# Patient Record
Sex: Female | Born: 1965 | Race: White | Hispanic: No | Marital: Married | Smoking: Current some day smoker
Health system: Southern US, Community
[De-identification: ages and names within clinical notes are randomized; demographics above are authoritative.]

## PROBLEM LIST (undated history)

## (undated) DIAGNOSIS — K760 Fatty (change of) liver, not elsewhere classified: Secondary | ICD-10-CM

## (undated) DIAGNOSIS — K7460 Unspecified cirrhosis of liver: Secondary | ICD-10-CM

## (undated) DIAGNOSIS — F32A Depression, unspecified: Secondary | ICD-10-CM

## (undated) DIAGNOSIS — F1020 Alcohol dependence, uncomplicated: Secondary | ICD-10-CM

## (undated) DIAGNOSIS — B019 Varicella without complication: Secondary | ICD-10-CM

## (undated) DIAGNOSIS — E785 Hyperlipidemia, unspecified: Secondary | ICD-10-CM

## (undated) DIAGNOSIS — I341 Nonrheumatic mitral (valve) prolapse: Secondary | ICD-10-CM

## (undated) DIAGNOSIS — F329 Major depressive disorder, single episode, unspecified: Secondary | ICD-10-CM

## (undated) DIAGNOSIS — J302 Other seasonal allergic rhinitis: Secondary | ICD-10-CM

## (undated) HISTORY — DX: Fatty (change of) liver, not elsewhere classified: K76.0

## (undated) HISTORY — DX: Major depressive disorder, single episode, unspecified: F32.9

## (undated) HISTORY — PX: OTHER SURGICAL HISTORY: SHX169

## (undated) HISTORY — DX: Varicella without complication: B01.9

## (undated) HISTORY — PX: TONSILLECTOMY AND ADENOIDECTOMY: SHX28

## (undated) HISTORY — DX: Hyperlipidemia, unspecified: E78.5

## (undated) HISTORY — DX: Alcohol dependence, uncomplicated: F10.20

## (undated) HISTORY — DX: Depression, unspecified: F32.A

## (undated) HISTORY — DX: Nonrheumatic mitral (valve) prolapse: I34.1

## (undated) HISTORY — DX: Other seasonal allergic rhinitis: J30.2

## (undated) HISTORY — PX: OVARIAN CYST REMOVAL: SHX89

---

## 1998-11-03 ENCOUNTER — Emergency Department (HOSPITAL_COMMUNITY): Admission: EM | Admit: 1998-11-03 | Discharge: 1998-11-03 | Payer: Self-pay | Admitting: Emergency Medicine

## 2000-02-06 ENCOUNTER — Other Ambulatory Visit: Admission: RE | Admit: 2000-02-06 | Discharge: 2000-02-06 | Payer: Self-pay | Admitting: *Deleted

## 2000-02-27 ENCOUNTER — Encounter: Payer: Self-pay | Admitting: *Deleted

## 2000-02-27 ENCOUNTER — Encounter: Admission: RE | Admit: 2000-02-27 | Discharge: 2000-02-27 | Payer: Self-pay | Admitting: *Deleted

## 2000-04-16 ENCOUNTER — Other Ambulatory Visit: Admission: RE | Admit: 2000-04-16 | Discharge: 2000-04-16 | Payer: Self-pay | Admitting: *Deleted

## 2001-02-25 ENCOUNTER — Other Ambulatory Visit: Admission: RE | Admit: 2001-02-25 | Discharge: 2001-02-25 | Payer: Self-pay | Admitting: *Deleted

## 2002-04-05 ENCOUNTER — Encounter: Payer: Self-pay | Admitting: Emergency Medicine

## 2002-04-05 ENCOUNTER — Emergency Department (HOSPITAL_COMMUNITY): Admission: EM | Admit: 2002-04-05 | Discharge: 2002-04-05 | Payer: Self-pay | Admitting: Emergency Medicine

## 2007-11-08 ENCOUNTER — Emergency Department (HOSPITAL_COMMUNITY): Admission: EM | Admit: 2007-11-08 | Discharge: 2007-11-08 | Payer: Self-pay | Admitting: Emergency Medicine

## 2009-06-23 ENCOUNTER — Emergency Department (HOSPITAL_COMMUNITY): Admission: EM | Admit: 2009-06-23 | Discharge: 2009-06-23 | Payer: Self-pay | Admitting: Emergency Medicine

## 2010-08-04 LAB — CBC
HCT: 39.8 % (ref 36.0–46.0)
Hemoglobin: 14 g/dL (ref 12.0–15.0)
MCHC: 35.1 g/dL (ref 30.0–36.0)
MCV: 102.4 fL — ABNORMAL HIGH (ref 78.0–100.0)
Platelets: 205 10*3/uL (ref 150–400)
RBC: 3.89 MIL/uL (ref 3.87–5.11)
RDW: 13.1 % (ref 11.5–15.5)
WBC: 9 10*3/uL (ref 4.0–10.5)

## 2010-08-04 LAB — URINALYSIS, ROUTINE W REFLEX MICROSCOPIC
Bilirubin Urine: NEGATIVE
Leukocytes, UA: NEGATIVE
Nitrite: NEGATIVE
Specific Gravity, Urine: 1.004 — ABNORMAL LOW (ref 1.005–1.030)
Urobilinogen, UA: 0.2 mg/dL (ref 0.0–1.0)
pH: 5.5 (ref 5.0–8.0)

## 2010-08-04 LAB — DIFFERENTIAL
Eosinophils Relative: 0 % (ref 0–5)
Lymphocytes Relative: 24 % (ref 12–46)
Lymphs Abs: 2.2 10*3/uL (ref 0.7–4.0)
Neutro Abs: 6.5 10*3/uL (ref 1.7–7.7)

## 2010-08-04 LAB — COMPREHENSIVE METABOLIC PANEL
ALT: 31 U/L (ref 0–35)
AST: 24 U/L (ref 0–37)
Albumin: 4.3 g/dL (ref 3.5–5.2)
Alkaline Phosphatase: 61 U/L (ref 39–117)
BUN: 11 mg/dL (ref 6–23)
CO2: 27 mEq/L (ref 19–32)
Calcium: 8.3 mg/dL — ABNORMAL LOW (ref 8.4–10.5)
Chloride: 109 mEq/L (ref 96–112)
Creatinine, Ser: 0.6 mg/dL (ref 0.4–1.2)
GFR calc Af Amer: >60 mL/min (ref >60)
GFR calc non Af Amer: >60 mL/min (ref >60)
Glucose, Bld: 125 mg/dL — ABNORMAL HIGH (ref 70–99)
Potassium: 3.8 mEq/L (ref 3.5–5.1)
Sodium: 142 mEq/L (ref 135–145)
Total Bilirubin: 0.2 mg/dL — ABNORMAL LOW (ref 0.3–1.2)
Total Protein: 7.3 g/dL (ref 6.0–8.3)

## 2010-08-04 LAB — RAPID URINE DRUG SCREEN, HOSP PERFORMED: Tetrahydrocannabinol: NOT DETECTED

## 2010-08-04 LAB — ETHANOL
Alcohol, Ethyl (B): 311 mg/dL — ABNORMAL HIGH (ref 0–10)
Alcohol, Ethyl (B): 419 mg/dL (ref 0–10)

## 2010-08-04 LAB — URINE MICROSCOPIC-ADD ON

## 2010-08-04 LAB — SALICYLATE LEVEL: Salicylate Lvl: <4 mg/dL (ref 2.8–20.0)

## 2010-08-04 LAB — ACETAMINOPHEN LEVEL: Acetaminophen (Tylenol), Serum: <10 ug/mL — ABNORMAL LOW (ref 10–30)

## 2013-07-13 LAB — HM PAP SMEAR: HM Pap smear: NORMAL

## 2013-07-13 LAB — HM MAMMOGRAPHY: HM Mammogram: NORMAL

## 2013-10-17 ENCOUNTER — Telehealth: Payer: Self-pay

## 2013-10-17 NOTE — Telephone Encounter (Signed)
NEW PATIENT  Has not had a primary care doctor in years.  Was referred to [place] by her OB GYN  Dr. Mare Loan.    Will bring some of her records with her during her office visit.    Medication and allergies:  Reviewed and updated  90 day supply/mail order: n/a Local pharmacy:  [id number] - [place],  - [phone]   Immunizations due: UTD   A/P: No changes to personal, family history or past surgical hx PAP- 08/07/13-normal per patient MMG- 06/2013- normal Tdap- 05/2012 per patient   To Discuss with Provider: Would like to see a specialist regarding a liver as soon as possible.

## 2013-10-20 ENCOUNTER — Encounter: Payer: Self-pay | Admitting: Family Medicine

## 2013-10-20 ENCOUNTER — Ambulatory Visit (INDEPENDENT_AMBULATORY_CARE_PROVIDER_SITE_OTHER): Payer: 59 | Admitting: Family Medicine

## 2013-10-20 VITALS — BP 140/100 | HR 99 | Temp 98.3°F | Resp 16 | Ht 66.25 in | Wt 153.0 lb

## 2013-10-20 DIAGNOSIS — F1020 Alcohol dependence, uncomplicated: Secondary | ICD-10-CM

## 2013-10-20 DIAGNOSIS — F341 Dysthymic disorder: Secondary | ICD-10-CM

## 2013-10-20 DIAGNOSIS — K760 Fatty (change of) liver, not elsewhere classified: Secondary | ICD-10-CM

## 2013-10-20 DIAGNOSIS — IMO0001 Reserved for inherently not codable concepts without codable children: Secondary | ICD-10-CM

## 2013-10-20 DIAGNOSIS — K7689 Other specified diseases of liver: Secondary | ICD-10-CM

## 2013-10-20 DIAGNOSIS — R030 Elevated blood-pressure reading, without diagnosis of hypertension: Secondary | ICD-10-CM

## 2013-10-20 DIAGNOSIS — E785 Hyperlipidemia, unspecified: Secondary | ICD-10-CM | POA: Insufficient documentation

## 2013-10-20 DIAGNOSIS — F418 Other specified anxiety disorders: Secondary | ICD-10-CM

## 2013-10-20 NOTE — Progress Notes (Signed)
   Subjective:    Patient ID: Kristina Morris, female    DOB: 03/30/1966, 48 y.o.   MRN: [id number]  HPI New to establish.  Previous MD- no PCP  GYN- [facility]  Elevated BP- pt reports this is not usual for her.  Very anxious today, 'i worked myself up'.  Tearful in office.  Fatty liver- 7 yrs ago was dx'd w/ fatty liver.  AST 229, ALT 264.  In 2013 AST 65 and AST 41 ('9 months clean'- ETOH).  June 2014 AST 102, ALT 84.  March 2015 AST 92, ALT 96. Older brother passed away in 07/17/2022 this year due to liver failure.  Another brother also has liver disease and 2 aunts who were not drinkers died of liver failure.  Hyperlipidemia- total cholesterol 233, LDL 158 08/11/13.  Not currently on meds.  ETOH- chronic problem, pt will stay sober for months at a time and then go back to it.  Reports she's 'in a good place right now'.  Hx of anxiety/depression- did not respond well to Wellbutrin.  Also took Prozac but did not feel this was helpful.  Took Zoloft w/o results.    Review of Systems For ROS see HPI     Objective:   Physical Exam  Vitals reviewed. Constitutional: She is oriented to person, place, and time. She appears well-developed and well-nourished. No distress.  HENT:  Head: Normocephalic and atraumatic.  Eyes: Conjunctivae and EOM are normal. Pupils are equal, round, and reactive to light.  Neck: Normal range of motion. Neck supple. No thyromegaly present.  Cardiovascular: Normal rate, regular rhythm, normal heart sounds and intact distal pulses.   No murmur heard. Pulmonary/Chest: Effort normal and breath sounds normal. No respiratory distress.  Abdominal: Soft. She exhibits no distension and no mass (no hepatomegaly present). There is no tenderness. There is no rebound and no guarding.  Musculoskeletal: She exhibits no edema.  Lymphadenopathy:    She has no cervical adenopathy.  Neurological: She is alert and oriented to person, place, and time.  Skin: Skin is warm and  dry.  Psychiatric: Her behavior is normal.  Tearful, very anxious          Assessment & Plan:

## 2013-10-20 NOTE — Progress Notes (Signed)
Pre visit review using our clinic review tool, if applicable. No additional management support is needed unless otherwise documented below in the visit note. 

## 2013-10-20 NOTE — Patient Instructions (Signed)
Follow up in 3 months to recheck mood We'll call you with your liver referral at [facility] Make sure you are NOT drinking- you can do this! Call with any questions or concerns Welcome!  We're glad to have you!!

## 2013-10-21 NOTE — Assessment & Plan Note (Signed)
New to provider, ongoing for pt.  She has had multiple family members die of liver failure and she fears that w/ her family hx and her personal hx of ETOH abuse, she's 'going to be next'.  Will refer to liver specialist at [facility] and continue to follow.

## 2013-10-21 NOTE — Assessment & Plan Note (Signed)
Pt has been in and out of treatment multiple times.  Stressed the need to eliminate all ETOH in setting of fatty liver.  Will continue to follow.

## 2013-10-21 NOTE — Assessment & Plan Note (Signed)
New to provider, recurrent for pt.  She is not interested in medication at this time- has taken meds in the past w/o improvement of sxs.  Will continue to follow at future visits.  If sxs don't improve or she continues to self medicate w/ ETOH, may require psych referral.

## 2013-10-21 NOTE — Assessment & Plan Note (Signed)
New to provider.  Pt reports this is very unusual.  Suspect this is due to pt's high level of anxiety regarding today's appt.  Will follow at future visits.

## 2013-10-21 NOTE — Assessment & Plan Note (Signed)
New to provider, ongoing for pt.  Reviewed recent labs.  Will not start statin at this time due to hx of elevated LFTs and fatty liver.  Will defer starting med until after evaluation by liver specialist.

## 2013-10-23 ENCOUNTER — Telehealth: Payer: Self-pay | Admitting: Family Medicine

## 2013-10-23 MED ORDER — HYDROXYZINE PAMOATE 25 MG PO CAPS: 25.0000 mg | ORAL_CAPSULE | Freq: Every day | ORAL | Status: DC

## 2013-10-23 NOTE — Telephone Encounter (Signed)
Caller name: Angelinah  Call back [phone] [facility]acy[facility] [address] [place]  Reason for call: pt was waiting for the RX  hydrOXYzine (VISTARIL) 25 MG capsule to go to pharmacy but it has not arrived.

## 2013-10-23 NOTE — Telephone Encounter (Signed)
Med filled. Pt notified.  

## 2013-11-06 ENCOUNTER — Telehealth: Payer: Self-pay | Admitting: *Deleted

## 2013-11-06 MED ORDER — CITALOPRAM HYDROBROMIDE 20 MG PO TABS: 20.0000 mg | ORAL_TABLET | Freq: Every day | ORAL | Status: DC
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
## 2013-11-06 NOTE — Telephone Encounter (Signed)
Needs f/u appt to discuss symptoms Start Celexa 20mg  daily

## 2013-11-06 NOTE — Telephone Encounter (Signed)
Med filled. Left a detailed message on pt phone.

## 2013-11-10 ENCOUNTER — Telehealth: Payer: Self-pay | Admitting: *Deleted

## 2013-11-10 NOTE — Telephone Encounter (Signed)
Have pt break medication in 1/2 and take 10 mg daily and see if the side effects improve.  She must eat while taking the medication.  If she is unable to eat or continues to have shaky feeling, she should stop all meds and call the office.  If she is able to tolerate the 1/2 tab, any transient side effects will resolve w/in 7 days

## 2013-11-10 NOTE — Telephone Encounter (Signed)
Caller name:  Vernona RiegerLaura Relation to pt:  self Call back number: [phone]  Pharmacy:  [facility] [address]  Reason for call:   Pt called, since taking citalopram (CELEXA) 20 MG tablet, she has been shaking really bad and has caused her to get sick on her stomach.  She has continued taking the medication, last taken at 8am this morning.  Still shaking and nauseated.  Has not gotten sick since early hours this morning.  Has not eaten anything but crackers since Saturday evening.  Advised pt not to take any more until someone at our office got in touch with her.  Please advise.  bw

## 2013-11-10 NOTE — Telephone Encounter (Signed)
Pt.notified

## 2013-11-17 ENCOUNTER — Telehealth: Payer: Self-pay | Admitting: Family Medicine

## 2013-11-17 NOTE — Telephone Encounter (Signed)
Patient Information:  Caller Name: Vernona RiegerLaura  Phone: [phone]  Patient: Kristina CashLilly, Kristina Morris  Gender: Female  DOB: 01-Apr-1966  Age: [age] Years  PCP: Sheliah Hatchabori, Katherine E.  Pregnant: No  Office Follow Up:  Does the office need to follow up with this patient?: Yes  Instructions For The Office: She has called multiple times, frustrated with repeating her history and only wants to see or speak to Dr. Beverely Lowabori. She has cut her medication in 1/2 to 10mg  and still experiencing shaking, headaches, blurred vision, N/V and anorexia. Please advise if she should d/c medication (Per Epic 11/10/2013 documentation).  RN Note:  Afebrile. 11/06/2013 Celexa ordered, last week, 6/29/ medication cut in half due to side effects. In the last week has lost 10lb, due to no appetite, N/V and headaches. Today, 11/17/2013 body aches, headaches, blurred vision and back ache. She did take her 10mg  today, 11/17/2013 (sometimes takes with food, sometimes not) but as per Dr. Beverely Lowabori note, 11/10/2013 she would like to know if she should d/c her medication? Please advise and also advise if ok to take Ibuprofen with medication.  Symptoms  Reason For Call & Symptoms: Celexa pill started on 11/06/2013, last week, cut in 1/2 per the Dr. and to give 5 days.  Reviewed Health History In EMR: Yes  Reviewed Medications In EMR: Yes  Reviewed Allergies In EMR: Yes  Reviewed Surgeries / Procedures: Yes  Date of Onset of Symptoms: 11/06/2013 OB / GYN:  LMP: Unknown  Guideline(s) Used:  Headache  Disposition Per Guideline:   See Today or Tomorrow in Office  Reason For Disposition Reached:   Unexplained headache that is present > 24 hours  Advice Given:  Call Back If:  You become worse.  Patient Will Follow Care Advice:  YES

## 2013-11-17 NOTE — Telephone Encounter (Signed)
Pt instructed to STOP Celexa.  Pt stated understanding. No hx migraines.  States she does not want to schedule an appointment at this time despite advise.  States she believes the HAs and blurry vision is due to Celexa.  States she will call to schedule an appt if her symptoms persist or worsen after stopping Celexa. Denies drinking. Pt instructed to take ibuprofen as needed.  Stated understanding. Pt stated that she understands.  States she has been under a lot stress lately with her son.  No further concerns at this time.

## 2013-11-17 NOTE — Telephone Encounter (Signed)
STOP Celexa Please ask pt if she has a hx of migraines- HA and blurry vision is concerning and if no history of migraines pt needs appt w/ any available provider to r/o something more serious than medication side effect Is she drinking again?  Has she been drinking recently?  Withdrawal from alcohol can have similar symptoms Ok to take ibuprofen PT NEEDS APPT TO DISCUSS- please explain that since we are seeing the patients in our office, that we are not able to return phone calls.  I understand this is frustrating but that's why we utilize our very qualified nursing staff

## 2013-11-17 NOTE — Telephone Encounter (Signed)
Dustin T Shives at 11/17/2013 9:50 AM     Status: Signed        Pt called in regarding the these previous messages. Pt states she is still depressed, having headaches, medication concerns. Only wants to see DR. Tabori. Stated that i would transfer to CAN or I could schedule her for Friday in the first available slot. Pt hung up the phone.   Pt spoke to CAN and stated that she continues to experience unpleasant side effects due to Celexa.  Please see note below and advise.

## 2013-11-17 NOTE — Telephone Encounter (Signed)
Pt called in regarding the these previous messages.  Pt states she is still depressed, having headaches, medication concerns.  Only wants to see DR. Tabori.  Stated that i would transfer to CAN or I could schedule her for Friday in the first available slot.  Pt hung up the phone.

## 2013-11-18 ENCOUNTER — Telehealth: Payer: Self-pay | Admitting: *Deleted

## 2013-11-18 NOTE — Telephone Encounter (Signed)
[facility] Triage Call Report Triage Record Num: [id number] Operator: Windy CannySharon Harrison Patient Name: Kristina SenegalLaura Morris Call Date & Time: 11/15/2013 11:47:19AM Patient Phone: [phone] PCP: Patient Gender: Female PCP Fax : Patient DOB: November 12, 1965 Practice Name: [facility] Reason for Call: Caller: Ann/Mother; PCP: Other; CB#: [phone]; Call regarding Reaction to new medicine; Pt is currently taking Celexa 10mg . Initially started on 20 mg (Started taking medication on 11/05/2013). Started shaking (possible tremors) , nausea, and vomiting.. Diarrhea as well. Afebrile. States she feels dizzy and feels like she is going to pass out. Onset 7/1. Reports increased depression. Problems with concentration. While on phone pt vomiting and shortness of breath. Stayed on phone with pt until EMS arrived. Advised pt to ED. Triage per Allergic Reaction, Severe " New or worsening signs and symptoms that may indicate shock". Protocol(s) Used: Allergic Reaction, Severe Recommended Outcome per Protocol: Activate EMS 911 Reason for Outcome: New or worsening signs and symptoms that may indicate shock Care Advice: ~ Do not give the patient anything to eat or drink. Lay the person down and elevate legs at least 12 inches (30 cm) above level of heart. Cover to help maintain body temperature. ~ ~ IMMEDIATE ACTION Write down provider's name. List or place the following in a bag for transport with the patient: current prescription and/or nonprescription medications; alternative treatments, therapies and medications; and street drugs. ~ If previously prescribed for these symptoms by provider for this person, administer dose of epinephrine (e.g. EpiPen) as directed. ~ An adult should stay with the patient, preferably one trained in CPR. If the person is not trained in CPR, then he or she should provide hands-only (compression-only) CPR as recommended by the American Heart Association. ~ 11/15/2013 12:12:58PM  Page 1 of 1 [email or web address]

## 2014-01-05 ENCOUNTER — Telehealth: Payer: Self-pay | Admitting: Family Medicine

## 2014-01-05 NOTE — Telephone Encounter (Signed)
Called and spoke with pt. She advised that she is still having her nervousness and is still on celexa but began drinking again. Pt states she wanted to go to inpatient treatment facility but does not have the financial ability. Pt was given [facility] and information for [facility]. Pt states that she is going to begin going to AA meetings again now that her kids are in school. Pt advised to call back to the office to let me know how things are going and to discuss with tabori if need be.

## 2014-01-05 NOTE — Telephone Encounter (Signed)
Caller name: Pollyann  Call back number:[phone]   Reason for call:  Pt has questions about mental health and anti depressants she took.   Did not want to talk about anymore with me.

## 2014-01-06 ENCOUNTER — Ambulatory Visit (INDEPENDENT_AMBULATORY_CARE_PROVIDER_SITE_OTHER): Payer: 59 | Admitting: Psychiatry

## 2014-01-06 DIAGNOSIS — F1020 Alcohol dependence, uncomplicated: Secondary | ICD-10-CM

## 2014-01-06 NOTE — Telephone Encounter (Signed)
I would strongly encourage pt to look into [facility] as a treatment options- her anxiety and depression will improve if we can get her alcohol free

## 2014-01-20 ENCOUNTER — Ambulatory Visit: Payer: 59 | Admitting: Family Medicine

## 2014-03-09 ENCOUNTER — Encounter: Payer: Self-pay | Admitting: Family Medicine

## 2014-03-09 ENCOUNTER — Ambulatory Visit (INDEPENDENT_AMBULATORY_CARE_PROVIDER_SITE_OTHER): Payer: 59 | Admitting: Family Medicine

## 2014-03-09 VITALS — BP 130/80 | HR 94 | Temp 98.1°F | Resp 16 | Wt 159.1 lb

## 2014-03-09 DIAGNOSIS — F418 Other specified anxiety disorders: Secondary | ICD-10-CM

## 2014-03-09 DIAGNOSIS — F1024 Alcohol dependence with alcohol-induced mood disorder: Secondary | ICD-10-CM

## 2014-03-09 MED ORDER — SERTRALINE HCL 25 MG PO TABS: 25.0000 mg | ORAL_TABLET | Freq: Every day | ORAL | Status: DC
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
## 2014-03-09 NOTE — Progress Notes (Signed)
Pre visit review using our clinic review tool, if applicable. No additional management support is needed unless otherwise documented below in the visit note. 
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
## 2014-03-10 ENCOUNTER — Telehealth: Payer: Self-pay | Admitting: Family Medicine

## 2014-03-10 NOTE — Telephone Encounter (Signed)
emmi emailed °
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
## 2014-03-10 NOTE — Assessment & Plan Note (Signed)
Pt admits to drinking since returning home from rehab but is fighting really hard to stay sober.  Is currently in counseling.  Applauded her efforts.  Will follow.

## 2014-04-06 ENCOUNTER — Encounter: Payer: Self-pay | Admitting: Family Medicine

## 2014-04-06 ENCOUNTER — Ambulatory Visit (INDEPENDENT_AMBULATORY_CARE_PROVIDER_SITE_OTHER): Payer: 59 | Admitting: Family Medicine

## 2014-04-06 VITALS — BP 120/80 | HR 90 | Temp 98.2°F | Resp 16 | Wt 159.1 lb

## 2014-04-06 DIAGNOSIS — Z23 Encounter for immunization: Secondary | ICD-10-CM

## 2014-04-06 DIAGNOSIS — F418 Other specified anxiety disorders: Secondary | ICD-10-CM

## 2014-04-06 MED ORDER — SERTRALINE HCL 50 MG PO TABS: 50.0000 mg | ORAL_TABLET | Freq: Every day | ORAL | Status: DC

## 2014-04-06 MED ORDER — HYDROXYZINE PAMOATE 25 MG PO CAPS: ORAL_CAPSULE | ORAL | Status: DC

## 2014-04-06 NOTE — Patient Instructions (Signed)
Follow up in 4-6 weeks to recheck mood Increase the Zoloft to 50mg - 2 of what you have at home, 1 of the new prescription Continue to take it one day at a time, one step at a time- you can do this! 36 days!!!  I'm proud of you!!! Happy Thanksgiving!

## 2014-04-06 NOTE — Progress Notes (Signed)
Pre visit review using our clinic review tool, if applicable. No additional management support is needed unless otherwise documented below in the visit note. 
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
## 2014-04-06 NOTE — Assessment & Plan Note (Signed)
Improved since last visit.  Pt is tolerating Zoloft and is interested in increasing dose to 50mg  daily.  New script sent.  Applauded her efforts at sobriety.  Will continue to follow closely.

## 2014-05-04 ENCOUNTER — Telehealth: Payer: Self-pay | Admitting: Family Medicine

## 2014-05-04 MED ORDER — HYDROXYZINE PAMOATE 25 MG PO CAPS: ORAL_CAPSULE | ORAL | Status: DC
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
## 2014-08-30 ENCOUNTER — Other Ambulatory Visit: Payer: Self-pay | Admitting: Family Medicine

## 2014-08-31 NOTE — Telephone Encounter (Signed)
Med filled.  

## 2014-09-28 ENCOUNTER — Ambulatory Visit (INDEPENDENT_AMBULATORY_CARE_PROVIDER_SITE_OTHER): Payer: 59 | Admitting: Family Medicine

## 2014-09-28 ENCOUNTER — Encounter: Payer: Self-pay | Admitting: Family Medicine

## 2014-09-28 VITALS — BP 130/80 | HR 80 | Temp 98.1°F | Resp 16 | Wt 157.5 lb

## 2014-09-28 DIAGNOSIS — F418 Other specified anxiety disorders: Secondary | ICD-10-CM | POA: Diagnosis not present

## 2014-09-28 DIAGNOSIS — F1024 Alcohol dependence with alcohol-induced mood disorder: Secondary | ICD-10-CM

## 2014-09-28 MED ORDER — SERTRALINE HCL 100 MG PO TABS: 100.0000 mg | ORAL_TABLET | Freq: Every day | ORAL | Status: DC

## 2014-09-28 MED ORDER — HYDROXYZINE PAMOATE 25 MG PO CAPS: ORAL_CAPSULE | ORAL | Status: DC

## 2014-09-28 NOTE — Progress Notes (Signed)
Pre visit review using our clinic review tool, if applicable. No additional management support is needed unless otherwise documented below in the visit note. 
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
## 2014-09-28 NOTE — Patient Instructions (Signed)
Follow up in 4-6 weeks to recheck mood Increase the Zoloft to 100mg  daily (new script sent in) Continue to see your therapist- this will be really helpful Call with any questions or concerns Hang in there!!

## 2014-09-28 NOTE — Assessment & Plan Note (Signed)
Pt relapsed last week.  Has been sober x6 days since drinking.  Encouraged her to attend regular AA meetings and work w/ her therapist.  Will follow closely.
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
## 2014-11-30 ENCOUNTER — Emergency Department (HOSPITAL_COMMUNITY): Payer: 59

## 2014-11-30 ENCOUNTER — Emergency Department (HOSPITAL_COMMUNITY)
Admission: EM | Admit: 2014-11-30 | Discharge: 2014-12-01 | Disposition: A | Discharge status: Home / Self Care | Payer: 59 | Attending: Emergency Medicine | Admitting: Emergency Medicine

## 2014-11-30 ENCOUNTER — Encounter (HOSPITAL_COMMUNITY): Payer: Self-pay | Admitting: Emergency Medicine

## 2014-11-30 DIAGNOSIS — Z8619 Personal history of other infectious and parasitic diseases: Secondary | ICD-10-CM | POA: Diagnosis not present

## 2014-11-30 DIAGNOSIS — E876 Hypokalemia: Secondary | ICD-10-CM | POA: Diagnosis not present

## 2014-11-30 DIAGNOSIS — R569 Unspecified convulsions: Secondary | ICD-10-CM | POA: Insufficient documentation

## 2014-11-30 DIAGNOSIS — Y998 Other external cause status: Secondary | ICD-10-CM | POA: Diagnosis not present

## 2014-11-30 DIAGNOSIS — Z79899 Other long term (current) drug therapy: Secondary | ICD-10-CM | POA: Insufficient documentation

## 2014-11-30 DIAGNOSIS — Z880 Allergy status to penicillin: Secondary | ICD-10-CM | POA: Insufficient documentation

## 2014-11-30 DIAGNOSIS — Z7951 Long term (current) use of inhaled steroids: Secondary | ICD-10-CM | POA: Diagnosis not present

## 2014-11-30 DIAGNOSIS — F329 Major depressive disorder, single episode, unspecified: Secondary | ICD-10-CM | POA: Diagnosis not present

## 2014-11-30 DIAGNOSIS — Y9389 Activity, other specified: Secondary | ICD-10-CM | POA: Insufficient documentation

## 2014-11-30 DIAGNOSIS — S00532A Contusion of oral cavity, initial encounter: Secondary | ICD-10-CM | POA: Diagnosis not present

## 2014-11-30 DIAGNOSIS — Z720 Tobacco use: Secondary | ICD-10-CM | POA: Insufficient documentation

## 2014-11-30 DIAGNOSIS — Y9289 Other specified places as the place of occurrence of the external cause: Secondary | ICD-10-CM | POA: Diagnosis not present

## 2014-11-30 DIAGNOSIS — Z8719 Personal history of other diseases of the digestive system: Secondary | ICD-10-CM | POA: Diagnosis not present

## 2014-11-30 DIAGNOSIS — X58XXXA Exposure to other specified factors, initial encounter: Secondary | ICD-10-CM | POA: Diagnosis not present

## 2014-11-30 LAB — CBC
HCT: 39.4 % (ref 36.0–46.0)
Hemoglobin: 13.6 g/dL (ref 12.0–15.0)
MCH: 36.2 pg — ABNORMAL HIGH (ref 26.0–34.0)
MCHC: 34.5 g/dL (ref 30.0–36.0)
MCV: 104.8 fL — AB (ref 78.0–100.0)
Platelets: 81 10*3/uL — ABNORMAL LOW (ref 150–400)
RBC: 3.76 MIL/uL — AB (ref 3.87–5.11)
RDW: 13.5 % (ref 11.5–15.5)
WBC: 4.4 10*3/uL (ref 4.0–10.5)

## 2014-11-30 LAB — RAPID URINE DRUG SCREEN, HOSP PERFORMED
AMPHETAMINES: NOT DETECTED
Barbiturates: NOT DETECTED
Benzodiazepines: NOT DETECTED
COCAINE: NOT DETECTED
OPIATES: NOT DETECTED
Tetrahydrocannabinol: NOT DETECTED

## 2014-11-30 LAB — COMPREHENSIVE METABOLIC PANEL
ALK PHOS: 89 U/L (ref 38–126)
ALT: 45 U/L (ref 14–54)
AST: 73 U/L — AB (ref 15–41)
Albumin: 4.2 g/dL (ref 3.5–5.0)
Anion gap: 12 (ref 5–15)
BUN: 7 mg/dL (ref 6–20)
CALCIUM: 9 mg/dL (ref 8.9–10.3)
CHLORIDE: 97 mmol/L — AB (ref 101–111)
CO2: 28 mmol/L (ref 22–32)
Creatinine, Ser: 0.52 mg/dL (ref 0.44–1.00)
Glucose, Bld: 101 mg/dL — ABNORMAL HIGH (ref 65–99)
POTASSIUM: 3 mmol/L — AB (ref 3.5–5.1)
Sodium: 137 mmol/L (ref 135–145)
Total Bilirubin: 2 mg/dL — ABNORMAL HIGH (ref 0.3–1.2)
Total Protein: 7.2 g/dL (ref 6.5–8.1)

## 2014-11-30 LAB — LIPASE, BLOOD: Lipase: 21 U/L — ABNORMAL LOW (ref 22–51)

## 2014-11-30 LAB — CBG MONITORING, ED: Glucose-Capillary: 89 mg/dL (ref 65–99)

## 2014-11-30 MED ORDER — LORAZEPAM 2 MG/ML IJ SOLN
1.0000 mg | Freq: Once | INTRAMUSCULAR | Status: AC
  Administered 2014-11-30: 1 mg via INTRAVENOUS
  Filled 2014-11-30: qty 1

## 2014-11-30 MED ORDER — SODIUM CHLORIDE 0.9 % IV SOLN
INTRAVENOUS | Status: DC
  Administered 2014-11-30: 22:00:00 via INTRAVENOUS

## 2014-11-30 NOTE — ED Provider Notes (Signed)
CSN: [id number]     Arrival date & time 11/30/14  2125 History   First MD Initiated Contact with Patient 11/30/14 2131     Chief Complaint  Patient presents with  . Seizures     (Consider location/radiation/quality/duration/timing/severity/associated sxs/prior Treatment) Patient is a 49 y.o. female presenting with seizures. The history is provided by the patient and the EMS personnel.  Seizures Patient w hx etoh abuse, presents via ems w report of seizure at home just pta tonight. Pt was on couch at home when began shaking, and was unresponsive. Lasted approximately 1 minute, ems notes pt post-ictal upon arrival. Upon ed arrival, pt alert, oriented, and does not recall earlier events. No trauma/fall associated w seizure. +contusion/bit tongue. No incontinence. States w etoh abuse hx had been doing better in past 3 months, w relatively little etoh use. Denies hx complicated etoh withdrawal, seizures or dts, even when was drinking more heavily. Denies other drug use. States physical health at baseline recently. Denies trauma or fall. No headaches. No neck or back pain. No cough or uri c/o. No cp or sob. No palpitations. No abd pain. No nvd. No dysuria or gu c/o. Denies recent change in meds.       Past Medical History  Diagnosis Date  . Mitral valve prolapse   . Seasonal allergies   . Chicken pox   . Depression   . Alcoholic   . Fatty liver   . Hyperlipidemia    Past Surgical History  Procedure Laterality Date  . Ovarian cyst removal    . 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 Cysts removal, sinus    . Tonsillectomy and adenoidectomy     Family History  Problem Relation Age of Onset  . Depression Mother   . Liver disease Brother     older brother  . Liver disease Maternal Aunt   . Liver disease Paternal Aunt   . Alcoholism      both sides of family  . Diabetes Brother   . Hypertension Brother     older brother  . Breast cancer Mother   . Osteoporosis Mother   . Cancer      both sides of  family  . Heart disease Father   . Heart disease Maternal Grandmother    History  Substance Use Topics  . Smoking status: Current Some Day Smoker    Types: Cigarettes  . Smokeless tobacco: Not on file     Comment: Smoke about 1-2 times a week  . Alcohol Use: Yes     Comment: Currently not drinking, but states that she has started and stopped quite a few times.     OB History    No data available     Review of Systems  Constitutional: Negative for fever and chills.  HENT: Negative for sore throat.   Eyes: Negative for pain and visual disturbance.  Respiratory: Negative for cough and shortness of breath.   Cardiovascular: Negative for chest pain, palpitations and leg swelling.  Gastrointestinal: Negative for vomiting, abdominal pain and diarrhea.  Endocrine: Negative for polyuria.  Genitourinary: Negative for dysuria and flank pain.  Musculoskeletal: Negative for back pain, neck pain and neck stiffness.  Skin: Negative for rash.  Neurological: Positive for seizures. Negative for weakness, numbness and headaches.  Hematological: Does not bruise/bleed easily.  Psychiatric/Behavioral: Negative for confusion.      Allergies  Avelox; Doxycycline; Moxifloxacin; Bee venom; Amoxicillin; Erythromycin; and Sulfa antibiotics  Home Medications   Prior to  Admission medications   Medication Sig Start Date End Date Taking? Authorizing Provider  b complex vitamins tablet Take 1 tablet by mouth daily.    Historical Provider, MD  BIOTIN PO Take 10,000 mcg by mouth daily.    Historical Provider, MD  calcium carbonate (OS-CAL) 600 MG TABS tablet Take 600 mg by mouth daily. + Vitamin D    Historical Provider, MD  EPINEPHrine 0.3 mg/0.3 mL IJ SOAJ injection Inject 0.3 mg into the muscle as needed.    Historical Provider, MD  fexofenadine (ALLEGRA) 180 MG tablet Take 180 mg by mouth daily.    Historical Provider, MD  fluticasone (FLONASE) 50 MCG/ACT nasal spray Place 2 sprays into both nostrils  daily.    Historical Provider, MD  folic acid (FOLVITE) 800 MCG tablet Take 800 mcg by mouth daily.    Historical Provider, MD  GILDESS FE 1/20 1-20 MG-MCG tablet Take 1 tablet by mouth daily. 08/20/13   Historical Provider, MD  hydrOXYzine (VISTARIL) 25 MG capsule 1 tab Q4-6 prn and nightly for sleep 09/28/14   Sheliah Hatch, MD  Multiple Vitamin (DAILY MULTIVITAMIN PO) Take 1 tablet by mouth daily.    Historical Provider, MD  Probiotic Product (FLORAJEN3 PO) Take by mouth.    Historical Provider, MD  Saccharomyces boulardii (FLORASTOR PO) Take by mouth.    Historical Provider, MD  sertraline (ZOLOFT) 100 MG tablet Take 1 tablet (100 mg total) by mouth daily. 09/28/14   Sheliah Hatch, MD  vitamin E 400 UNIT capsule Take 400 Units by mouth daily.    Historical Provider, MD   SpO2 100% Physical Exam  Constitutional: She is oriented to person, place, and time. She appears well-developed and well-nourished. No distress.  HENT:  Nose: Nose normal.  Mouth/Throat: Oropharynx is clear and moist.  Contusion to lateral edge tongue.   Eyes: Conjunctivae are normal. Pupils are equal, round, and reactive to light. No scleral icterus.  Neck: Normal range of motion. Neck supple. No JVD present. No tracheal deviation present. No thyromegaly present.  No stiffness or rigidity. No bruit.   Cardiovascular: Normal rate, regular rhythm, normal heart sounds and intact distal pulses.  Exam reveals no gallop and no friction rub.   No murmur heard. Pulmonary/Chest: Effort normal and breath sounds normal. No respiratory distress. She exhibits no tenderness.  Abdominal: Soft. Normal appearance and bowel sounds are normal. She exhibits no distension and no mass. There is no tenderness. There is no rebound and no guarding.  Genitourinary:  No cva tenderness  Musculoskeletal: She exhibits no edema or tenderness.  CTLS spine, non tender, aligned, no step off. Good rom bil ext without pain or focal bony  tenderness, distal pulses palp bil.  Neurological: She is alert and oriented to person, place, and time.  Motor intact bil. No tremor or shakes. sens grossly intact.   Skin: Skin is warm and dry. No rash noted. She is not diaphoretic.  Psychiatric:  Mildly anxious  Nursing note and vitals reviewed.   ED Course  Procedures (including critical care time) Labs Review  Results for orders placed or performed during the hospital encounter of 11/30/14  Urine rapid drug screen (hosp performed)  Result Value Ref Range   Opiates NONE DETECTED NONE DETECTED   Cocaine NONE DETECTED NONE DETECTED   Benzodiazepines NONE DETECTED NONE DETECTED   Amphetamines NONE DETECTED NONE DETECTED   Tetrahydrocannabinol NONE DETECTED NONE DETECTED   Barbiturates NONE DETECTED NONE DETECTED  EKG Interpretation   Date/Time:  Monday November 30 2014 21:51:52 EDT Ventricular Rate:  65 PR Interval:  160 QRS Duration: 83 QT Interval:  433 QTC Calculation: 450 R Axis:   54 Text Interpretation:  Normal sinus rhythm No significant change since last  tracing Confirmed by Denton LankSTEINL  MD, Caryn BeeKEVIN ([id number]) on 11/30/2014 9:57:03 PM      MDM   Iv ns. Continuous pulse ox and monitor. Seizure precautions.  Labs.   Ct.  Reviewed nursing notes and prior charts for additional history.   2300 ct and labs pending - signed out to Dr Preston FleetingGlick to check labs and ct, recheck pt and dispo appropriately.      Cathren LaineKevin Valree Feild, MD 11/30/14 2300

## 2014-11-30 NOTE — ED Notes (Addendum)
Pt arrives via [facility] for reports of a seizure at home. Pt was found unresponsive on her couch exhibiting seizure activity-shaking- by her daughter. Pt denies hx of seizures. Was post ictal upon ems arrival, also was incontinent of urine. Pt received  of zofran and 100cc NS. Pt has hx of alcohol abuse, reports 3 months since last drink, denies alcohol withdrawal symptoms in the past when she has quit drinking. Pt alert, oriented, nad.

## 2014-12-01 LAB — ETHANOL: Alcohol, Ethyl (B): <5 mg/dL (ref <5)

## 2014-12-01 MED ORDER — POTASSIUM CHLORIDE CRYS ER 20 MEQ PO TBCR
40.0000 meq | EXTENDED_RELEASE_TABLET | Freq: Once | ORAL | Status: AC
  Administered 2014-12-01: 40 meq via ORAL
  Filled 2014-12-01: qty 2

## 2014-12-01 MED ORDER — POTASSIUM CHLORIDE CRYS ER 20 MEQ PO TBCR: 20.0000 meq | EXTENDED_RELEASE_TABLET | Freq: Two times a day (BID) | ORAL | Status: DC

## 2014-12-01 NOTE — Discharge Instructions (Signed)
Contact your primary care provider to arrange for an outpatient EEG. No treatment is needed for seizures, see her EEG is abnormal, or if you have another seizure. Please remember, the law in [place] is that he may not drive a car until you have gone 6 months without a seizure.  Seizure, Adult A seizure is abnormal electrical activity in the brain. Seizures usually last from 30 seconds to 2 minutes. There are various types of seizures. Before a seizure, you may have a warning sensation (aura) that a seizure is about to occur. An aura may include the following symptoms:   Fear or anxiety.  Nausea.  Feeling like the room is spinning (vertigo).  Vision changes, such as seeing flashing lights or spots. Common symptoms during a seizure include:  A change in attention or behavior (altered mental status).  Convulsions with rhythmic jerking movements.  Drooling.  Rapid eye movements.  Grunting.  Loss of bladder and bowel control.  Bitter taste in the mouth.  Tongue biting. After a seizure, you may feel confused and sleepy. You may also have an injury resulting from convulsions during the seizure. HOME CARE INSTRUCTIONS   If you are given medicines, take them exactly as prescribed by your health care provider.  Keep all follow-up appointments as directed by your health care provider.  Do not swim or drive or engage in risky activity during which a seizure could cause further injury to you or others until your health care provider says it is OK.  Get adequate rest.  Teach friends and family what to do if you have a seizure. They should:  Lay you on the ground to prevent a fall.  Put a cushion under your head.  Loosen any tight clothing around your neck.  Turn you on your side. If vomiting occurs, this helps keep your airway clear.  Stay with you until you recover.  Know whether or not you need emergency care. SEEK IMMEDIATE MEDICAL CARE IF:  The seizure lasts  longer than 5 minutes.  The seizure is severe or you do not wake up immediately after the seizure.  You have an altered mental status after the seizure.  You are having more frequent or worsening seizures. Someone should drive you to the emergency department or call local emergency services (911 in [place] MAKE SURE YOU:  Understand these instructions.  Will watch your condition.  Will get help right away if you are not doing well or get worse. Document Released: 04/28/2000 Document Revised: 02/19/2013 Document Reviewed: 12/11/2012 [facility] Patient Information 2015 [place], [place]. This information is not intended to replace advice given to you by your health care provider. Make sure you discuss any questions you have with your health care provider.   Hypokalemia Hypokalemia means that the amount of potassium in the blood is lower than normal.Potassium is a chemical, called an electrolyte, that helps regulate the amount of fluid in the body. It also stimulates muscle contraction and helps nerves function properly.Most of the body's potassium is inside of cells, and only a very small amount is in the blood. Because the amount in the blood is so small, minor changes can be life-threatening. CAUSES  Antibiotics.  Diarrhea or vomiting.  Using laxatives too much, which can cause diarrhea.  Chronic kidney disease.  Water pills (diuretics).  Eating disorders (bulimia).  Low magnesium level.  Sweating a lot. SIGNS AND SYMPTOMS  Weakness.  Constipation.  Fatigue.  Muscle cramps.  Mental confusion.  Skipped heartbeats or irregular  heartbeat (palpitations).  Tingling or numbness. DIAGNOSIS  Your health care provider can diagnose hypokalemia with blood tests. In addition to checking your potassium level, your health care provider may also check other lab tests. TREATMENT Hypokalemia can be treated with potassium supplements taken by mouth or adjustments in your current  medicines. If your potassium level is very low, you may need to get potassium through a vein (IV) and be monitored in the hospital. A diet high in potassium is also helpful. Foods high in potassium are:  Nuts, such as peanuts and pistachios.  Seeds, such as sunflower seeds and pumpkin seeds.  Peas, lentils, and lima beans.  Whole grain and bran cereals and breads.  Fresh fruit and vegetables, such as apricots, avocado, bananas, cantaloupe, kiwi, oranges, tomatoes, asparagus, and potatoes.  Orange and tomato juices.  Red meats.  Fruit yogurt. HOME CARE INSTRUCTIONS  Take all medicines as prescribed by your health care provider.  Maintain a healthy diet by including nutritious food, such as fruits, vegetables, nuts, whole grains, and lean meats.  If you are taking a laxative, be sure to follow the directions on the label. SEEK MEDICAL CARE IF:  Your weakness gets worse.  You feel your heart pounding or racing.  You are vomiting or having diarrhea.  You are diabetic and having trouble keeping your blood glucose in the normal range. SEEK IMMEDIATE MEDICAL CARE IF:  You have chest pain, shortness of breath, or dizziness.  You are vomiting or having diarrhea for more than 2 days.  You faint. MAKE SURE YOU:   Understand these instructions.  Will watch your condition.  Will get help right away if you are not doing well or get worse. Document Released: 05/01/2005 Document Revised: 02/19/2013 Document Reviewed: 11/01/2012 [facility]ExitCare Patient Information 2015 [place]. This information is not intended to replace advice given to you by your health care provider. Make sure you discuss any questions you have with your health care provider.  Potassium Salts tablets, extended-release tablets or capsules What is this medicine? POTASSIUM (poe TASS i um) is a natural salt that is important for the heart, muscles, and nerves. It is found in many foods and is normally supplied by a  well balanced diet. This medicine is used to treat low potassium. This medicine may be used for other purposes; ask your health care provider or pharmacist if you have questions. COMMON BRAND NAME(S): ED-K+10, Glu-K, K-10, K-8, K-Dur, K-Tab, Kaon-CL, Klor-Con, Klor-Con M10, Klor-Con M15, Klor-Con M20, Klotrix, Micro-K, Micro-K Extencaps, Slow-K What should I tell my health care provider before I take this medicine? They need to know if you have any of these conditions: -dehydration -diabetes -irregular heartbeat -kidney disease -stomach ulcers or other stomach problems -an unusual or allergic reaction to potassium salts, other medicines, foods, dyes, or preservatives -pregnant or trying to get pregnant -breast-feeding How should I use this medicine? Take this medicine by mouth with a full glass of water. Follow the directions on the prescription label. Take with food. Do not suck on, crush, or chew this medicine. If you have difficulty swallowing, ask the pharmacist how to take. Take your medicine at regular intervals. Do not take it more often than directed. Do not stop taking except on your doctor's advice. Talk to your pediatrician regarding the use of this medicine in children. Special care may be needed. Overdosage: If you think you have taken too much of this medicine contact a poison control center or emergency room at once. NOTE: This  medicine is only for you. Do not share this medicine with others. What if I miss a dose? If you miss a dose, take it as soon as you can. If it is almost time for your next dose, take only that dose. Do not take double or extra doses. What may interact with this medicine? Do not take this medicine with any of the following medications: -eplerenone -sodium polystyrene sulfonate This medicine may also interact with the following medications: -medicines for blood pressure or heart disease like lisinopril, losartan, quinapril, valsartan -medicines for cold  or allergies -medicines for inflammation like ibuprofen, indomethacin -medicines for Parkinson's disease -medicines for the stomach like metoclopramide, dicyclomine, glycopyrrolate -some diuretics This list may not describe all possible interactions. Give your health care provider a list of all the medicines, herbs, non-prescription drugs, or dietary supplements you use. Also tell them if you smoke, drink alcohol, or use illegal drugs. Some items may interact with your medicine. What should I watch for while using this medicine? Visit your doctor or health care professional for regular check ups. You will need lab work done regularly. You may need to be on a special diet while taking this medicine. Ask your doctor. What side effects may I notice from receiving this medicine? Side effects that you should report to your doctor or health care professional as soon as possible: -allergic reactions like skin rash, itching or hives, swelling of the face, lips, or tongue -black, tarry stools -heartburn -irregular heartbeat -numbness or tingling in hands or feet -pain when swallowing -unusually weak or tired Side effects that usually do not require medical attention (report to your doctor or health care professional if they continue or are bothersome): -diarrhea -nausea -stomach gas -vomiting This list may not describe all possible side effects. Call your doctor for medical advice about side effects. You may report side effects to FDA at 1-800-FDA-1088. Where should I keep my medicine? Keep out of the reach of children. Store at room temperature between 15 and 30 degrees C (59 and 86 degrees F ). Keep bottle closed tightly to protect this medicine from light and moisture. Throw away any unused medicine after the expiration date. NOTE: This sheet is a summary. It may not cover all possible information. If you have questions about this medicine, talk to your doctor, pharmacist, or health care  provider.  2015, [employer] Standard. (2007-07-17 11:17:31)

## 2014-12-01 NOTE — ED Provider Notes (Signed)
Patient initially seen and evaluated by Dr. Denton LankSteinl having presented with first time seizure. Workup is significant only for hypokalemia. Patient was advised of results and advised to make arrangements for outpatient EEG to complete seizure workup. She is given a dose of potassium in the ED and is discharged with a prescription for K-Dur. She was again questioned about recent alcohol use and specifically denied same.  Dione Boozeavid Denia Mcvicar, MD 12/01/14 [phone]

## 2014-12-01 NOTE — ED Notes (Signed)
Pt ambulating independently w/ steady gait on d/c in no acute distress, A&Ox4. D/c instructions reviewed w/ pt and family - pt and family deny any further questions or concerns at present. Rx given x1  

## 2014-12-02 ENCOUNTER — Other Ambulatory Visit: Payer: Self-pay | Admitting: General Practice

## 2014-12-02 ENCOUNTER — Ambulatory Visit (INDEPENDENT_AMBULATORY_CARE_PROVIDER_SITE_OTHER): Payer: 59 | Admitting: Family Medicine

## 2014-12-02 ENCOUNTER — Telehealth: Payer: Self-pay | Admitting: Family Medicine

## 2014-12-02 ENCOUNTER — Telehealth: Payer: Self-pay

## 2014-12-02 ENCOUNTER — Encounter: Payer: Self-pay | Admitting: Family Medicine

## 2014-12-02 VITALS — BP 126/86 | HR 83 | Temp 97.9°F | Resp 16 | Wt 153.5 lb

## 2014-12-02 DIAGNOSIS — R569 Unspecified convulsions: Secondary | ICD-10-CM | POA: Diagnosis not present

## 2014-12-02 DIAGNOSIS — E876 Hypokalemia: Secondary | ICD-10-CM | POA: Insufficient documentation

## 2014-12-02 LAB — CBC WITH DIFFERENTIAL/PLATELET
Basophils Absolute: 0 10*3/uL (ref 0.0–0.1)
Basophils Relative: 0.3 % (ref 0.0–3.0)
Eosinophils Absolute: 0.2 10*3/uL (ref 0.0–0.7)
Eosinophils Relative: 2.9 % (ref 0.0–5.0)
HCT: 40.2 % (ref 36.0–46.0)
HEMOGLOBIN: 14 g/dL (ref 12.0–15.0)
LYMPHS ABS: 1.7 10*3/uL (ref 0.7–4.0)
Lymphocytes Relative: 28.3 % (ref 12.0–46.0)
MCHC: 34.9 g/dL (ref 30.0–36.0)
MCV: 106.9 fl — AB (ref 78.0–100.0)
Monocytes Absolute: 0.2 10*3/uL (ref 0.1–1.0)
Monocytes Relative: 3.6 % (ref 3.0–12.0)
Neutro Abs: 3.9 10*3/uL (ref 1.4–7.7)
Neutrophils Relative %: 64.9 % (ref 43.0–77.0)
Platelets: 111 10*3/uL — ABNORMAL LOW (ref 150.0–400.0)
RBC: 3.76 Mil/uL — AB (ref 3.87–5.11)
RDW: 14 % (ref 11.5–15.5)
WBC: 5.9 10*3/uL (ref 4.0–10.5)

## 2014-12-02 LAB — BASIC METABOLIC PANEL
BUN: 10 mg/dL (ref 6–23)
CO2: 31 meq/L (ref 19–32)
CREATININE: 0.54 mg/dL (ref 0.40–1.20)
Calcium: 9.8 mg/dL (ref 8.4–10.5)
Chloride: 99 mEq/L (ref 96–112)
GFR: 127.26 mL/min (ref >60.00)
Glucose, Bld: 89 mg/dL (ref 70–99)
Potassium: 3.1 mEq/L — ABNORMAL LOW (ref 3.5–5.1)
Sodium: 139 mEq/L (ref 135–145)

## 2014-12-02 LAB — TSH: TSH: 2.1 u[IU]/mL (ref 0.35–4.50)

## 2014-12-02 MED ORDER — POTASSIUM CHLORIDE CRYS ER 20 MEQ PO TBCR: 20.0000 meq | EXTENDED_RELEASE_TABLET | Freq: Every day | ORAL | Status: DC

## 2014-12-02 NOTE — Telephone Encounter (Signed)
Left a message for call back.  

## 2014-12-02 NOTE — Patient Instructions (Signed)
Schedule your complete physical in 3-4 months We'll notify you of your lab results and make any changes if needed We'll call you with your Neurology appt Call with any questions or concerns Hang in there!  We'll figure this out!

## 2014-12-02 NOTE — Telephone Encounter (Signed)
Pt is doing okay this morning.  No seizures since discharged from hospital.  Family member will be driving patient to her appointment today at 11 am.

## 2014-12-02 NOTE — Progress Notes (Signed)
   Subjective:    Patient ID: Kristina CashLaura J Morris, female    DOB: 1965/07/25, 49 y.o.   MRN: [id number]  HPI Seizure- pt was seen in ER on 7/18 after new onset seizure activity.  Had normal work up w/ exception of hypokalemia.  Pt's episode was witnessed by 49 yr old daughter.  Daughter reports she was talking to pt and she wouldn't respond and then noted blood in pt's mouth after she bit her tongue.  Daughter called 911.  Pt has no recollection of events.  Denies ETOH.  No drugs/ETOH in blood.  No changes in medications.  No further seizure activity since d/c from ER on 7/19.   Review of Systems For ROS see HPI     Objective:   Physical Exam  Constitutional: She is oriented to person, place, and time. She appears well-developed and well-nourished. No distress.  HENT:  Head: Normocephalic and atraumatic.  Eyes: Conjunctivae and EOM are normal. Pupils are equal, round, and reactive to light.  Neck: Normal range of motion. Neck supple.  Cardiovascular: Normal rate, regular rhythm, normal heart sounds and intact distal pulses.   Pulmonary/Chest: Effort normal and breath sounds normal. No respiratory distress. She has no wheezes. She has no rales.  Lymphadenopathy:    She has no cervical adenopathy.  Neurological: She is alert and oriented to person, place, and time. She has normal reflexes. No cranial nerve deficit. Coordination normal.  Psychiatric: She has a normal mood and affect. Her behavior is normal. Judgment and thought content normal.  Vitals reviewed.         Assessment & Plan:

## 2014-12-02 NOTE — Telephone Encounter (Signed)
Pt notified that provider is ok with her going to the pool, however provider would like someone to be with her at all times, that way she has someone to call for assistance if she has another seizure.

## 2014-12-02 NOTE — Telephone Encounter (Signed)
Caller name: Davionne Relation to pt: Call back number: [phone] Pharmacy:  Reason for call:   Patient is wanting to know if she can go swimming until she see's the neurologist?

## 2014-12-02 NOTE — Progress Notes (Signed)
Pre visit review using our clinic review tool, if applicable. No additional management support is needed unless otherwise documented below in the visit note. 

## 2014-12-04 ENCOUNTER — Encounter: Payer: Self-pay | Admitting: Neurology

## 2014-12-04 ENCOUNTER — Ambulatory Visit (INDEPENDENT_AMBULATORY_CARE_PROVIDER_SITE_OTHER): Payer: 59 | Admitting: Neurology

## 2014-12-04 VITALS — BP 108/72 | HR 69 | Resp 16 | Ht 66.0 in | Wt 152.0 lb

## 2014-12-04 DIAGNOSIS — R569 Unspecified convulsions: Secondary | ICD-10-CM

## 2014-12-04 DIAGNOSIS — R292 Abnormal reflex: Secondary | ICD-10-CM | POA: Insufficient documentation

## 2014-12-04 NOTE — Progress Notes (Signed)
NEUROLOGY CONSULTATION NOTE  [name] MRN: [id number] DOB: [birth year]  Referring provider: Dr. Neena Rhymes Primary care provider: Dr. Neena Rhymes  Reason for consult:  seizure  Dear Dr Beverely Low:  Thank you for your kind referral of Kristina Morris for consultation of the above symptoms. Although her history is well known to you, please allow me to reiterate it for the purpose of our medical record. Records and images were personally reviewed where available.  HISTORY OF PRESENT ILLNESS: This is a pleasant 49 year old right-handed woman with a history of depression, anxiety, alcohol abuse, sober for 3 months, presenting with new onset seizure last 11/30/2014. She reports feeling run down 3 days prior to the seizure, with pain in the right upper quadrant of her abdomen, attributed to a pulled muscle. She recalls looking at her daughter that evening, and feeling like a projector was putting what she saw on TV on the wall as she turned her head, then next thing she knew she was waking up to EMS around her. She did not recognize familiar people and was in and out in the ambulance. She did not recall any focal weakness, her whole body felt sore. She had bitten the sides of her tongue, no incontinence. She was brought to [facility] where CBC and CMP were unremarkable except for potassium of 3.0. UDS and EtOH level negative. I personally reviewed head CT without contrast which did not show any acute changes. She was back to baseline and discharged home.  She reports an episode of loss of consciousness without convulsive activity after she was bit by a bee 7 years ago. She recalls feeing unwell and was told that she was unresponsive before she passed out. She denies any olfactory/gustatory hallucinations, deja vu, rising epigastric sensation, focal numbness/tingling/weakness, myoclonic jerks. She denies any alcohol intake in the past 3 months, no sleep deprivation prior to the seizure. She has had  headaches for the past couple of years, better with less stress. She denies any dizziness, diplopia, dysarthria, dysphagia, neck/back pain, focal numbness/tingling/weakness, bowel/bladder dysfunction. She had a normal birth and early development.  There is no history of febrile convulsions, CNS infections such as meningitis/encephalitis, significant traumatic brain injury, neurosurgical procedures, or family history of seizures.  PAST MEDICAL HISTORY: Past Medical History  Diagnosis Date  . Mitral valve prolapse   . Seasonal allergies   . Chicken pox   . Depression   . Alcoholic   . Fatty liver   . Hyperlipidemia     PAST SURGICAL HISTORY: Past Surgical History  Procedure Laterality Date  . Ovarian cyst removal    . 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 Cysts removal, sinus    . Tonsillectomy and adenoidectomy      MEDICATIONS: Current Outpatient Prescriptions on File Prior to Visit  Medication Sig Dispense Refill  . b complex vitamins tablet Take 1 tablet by mouth 2 (two) times a week.     Marland Kitchen BIOTIN PO Take 10,000 mcg by mouth daily.    . calcium carbonate (OS-CAL) 600 MG TABS tablet Take 600 mg by mouth daily. + Vitamin D    . fexofenadine (ALLEGRA) 180 MG tablet Take 180 mg by mouth as needed for allergies.     . fluticasone (FLONASE) 50 MCG/ACT nasal spray Place 2 sprays into both nostrils daily as needed for allergies.     . folic acid (FOLVITE) 800 MCG tablet Take 800 mcg by mouth daily.    Marland Kitchen GILDESS  FE 1/20 1-20 MG-MCG tablet Take 1 tablet by mouth daily.    . hydrOXYzine (VISTARIL) 25 MG capsule 1 tab Q4-6 prn and nightly for sleep (Patient taking differently: Take 25 mg by mouth 2 (two) times daily as needed for anxiety (takes in daytime as needed and takes every night for sleep). ) 90 capsule 1  . Multiple Vitamin (DAILY MULTIVITAMIN PO) Take 1 tablet by mouth daily.    . potassium chloride SA (K-DUR,KLOR-CON) 20 MEQ tablet Take 1 tablet (20 mEq total) by mouth daily. 30 tablet 6  .  sertraline (ZOLOFT) 100 MG tablet Take 1 tablet (100 mg total) by mouth daily. (Patient taking differently: Take 100 mg by mouth every evening. ) 30 tablet 3  . vitamin E 400 UNIT capsule Take 400 Units by mouth daily.    Marland Kitchen EPINEPHrine 0.3 mg/0.3 mL IJ SOAJ injection Inject 0.3 mg into the muscle as needed.     No current facility-administered medications on file prior to visit.    ALLERGIES: Allergies  Allergen Reactions  . Avelox [Moxifloxacin Hcl In Nacl] Shortness Of Breath, Itching and Rash    GI Upset  . Bee Venom Anaphylaxis  . Doxycycline Nausea Only and Rash  . Amoxicillin Rash    Upset stomach   . Erythromycin Rash    Upset stomach  . Sulfa Antibiotics Rash    Upset stomach    FAMILY HISTORY: Family History  Problem Relation Age of Onset  . Depression Mother   . Liver disease Brother     older brother  . Liver disease Maternal Aunt   . Liver disease Paternal Aunt   . Alcoholism      both sides of family  . Diabetes Brother   . Hypertension Brother     older brother  . Breast cancer Mother   . Osteoporosis Mother   . Cancer      both sides of family  . Heart disease Father   . Heart disease Maternal Grandmother     SOCIAL HISTORY: History   Social History  . Marital Status: Married    Spouse Name: N/A  . Number of Children: N/A  . Years of Education: N/A   Occupational History  . Not on file.   Social History Main Topics  . Smoking status: Current Some Day Smoker    Types: Cigarettes  . Smokeless tobacco: Not on file     Comment: Smoke about 1-2 times a week  . Alcohol Use: No     Comment: History of Alcohol Abuse  . Drug Use: No  . Sexual Activity:    Partners: Male   Other Topics Concern  . Not on file   Social History Narrative    REVIEW OF SYSTEMS: Constitutional: No fevers, chills, or sweats, no generalized fatigue, change in appetite Eyes: No visual changes, double vision, eye pain Ear, nose and throat: No hearing loss, ear  pain, nasal congestion, sore throat Cardiovascular: No chest pain, palpitations Respiratory:  No shortness of breath at rest or with exertion, wheezes GastrointestinaI: No nausea, vomiting, diarrhea, abdominal pain, fecal incontinence Genitourinary:  No dysuria, urinary retention or frequency Musculoskeletal:  No neck pain, back pain Integumentary: No rash, pruritus, skin lesions Neurological: as above Psychiatric: No depression, insomnia, anxiety Endocrine: No palpitations, fatigue, diaphoresis, mood swings, change in appetite, change in weight, increased thirst Hematologic/Lymphatic:  No anemia, purpura, petechiae. Allergic/Immunologic: no itchy/runny eyes, nasal congestion, recent allergic reactions, rashes  PHYSICAL EXAM: Filed Vitals:   12/04/14  1520  BP: 108/72  Pulse: 69  Resp: 16   General: No acute distress Head:  Normocephalic/atraumatic Eyes: Fundoscopic exam shows bilateral sharp discs, no vessel changes, exudates, or hemorrhages Neck: supple, no paraspinal tenderness, full range of motion Back: No paraspinal tenderness Heart: regular rate and rhythm Lungs: Clear to auscultation bilaterally. Vascular: No carotid bruits. Skin/Extremities: No rash, no edema Neurological Exam: Mental status: alert and oriented to person, place, and time, no dysarthria or aphasia, Fund of knowledge is appropriate.  Recent and remote memory are intact. 2/3 delayed recall. Attention and concentration are normal.    Able to name objects and repeat phrases. Cranial nerves: CN I: not tested CN II: pupils equal, round and reactive to light, visual fields intact, fundi unremarkable. CN III, IV, VI:  full range of motion, no nystagmus, no ptosis CN V: facial sensation intact CN VII: upper and lower face symmetric CN VIII: hearing intact to finger rub CN IX, X: gag intact, uvula midline CN XI: sternocleidomastoid and trapezius muscles intact CN XII: tongue midline Bulk & Tone: normal, no  fasciculations. Motor: 5/5 throughout with no pronator drift. Sensation: intact to light touch, cold, pin, vibration and joint position sense.  No extinction to double simultaneous stimulation.  Romberg test negative Deep Tendon Reflexes: brisk +3 throughout with bilateral Hoffman sign, no ankle clonus Plantar responses: downgoing bilaterally Cerebellar: no incoordination on finger to nose, heel to shin. No dysdiadochokinesia Gait: narrow-based and steady, able to tandem walk adequately. Tremor: none  IMPRESSION: This is a pleasant 49 year old right-handed woman with a history of depression, anxiety, alcohol abuse, sober for 3 months, presenting with an apparent unprovoked seizure. Her neurological exam today is non-focal however there is note of hyperreflexia. MRI brain with and without contrast and MRI C-spine with and without contrast will be ordered to assess for focal structural abnormality. A routine EEG will be done. We discussed that after an initial seizure, unless there are significant risk factors, an abnormal neurological exam, an EEG showing epileptiform abnormalities, and/or abnormal neuroimaging, treatment with an antiepileptic drug is not indicated. We discussed 10% of the population may have a single seizure. Patients with a single unprovoked seizure have a recurrence rate of 33% after a single seizure and 73% after a second seizure. We discussed [place] driving restrictions which indicate a patient needs to free of seizures or events of altered awareness for 6 months prior to resuming driving. The patient agreed to comply with these restrictions.  Seizure precautions were discussed which include no driving, no bathing in a tub, no swimming alone, no cooking over an open flame, no operating dangerous machinery, and no activities which may endanger oneself or someone else. She will follow-up after the tests and knows to call our office for any questions in the interim.  Thank you for allowing  me to participate in the care of this patient. Please do not hesitate to call for any questions or concerns.   Patrcia Dolly, M.D.  CC: Dr. Beverely Low

## 2014-12-04 NOTE — Patient Instructions (Signed)
1. Schedule MRI brain with and without contrast 2. Schedule MRI cervical spine with and without contrast 3. Schedule routine EEG 4. Follow-up after tests  Seizure Precautions: 1. If medication has been prescribed for you to prevent seizures, take it exactly as directed.  Do not stop taking the medicine without talking to your doctor first, even if you have not had a seizure in a long time.   2. Avoid activities in which a seizure would cause danger to yourself or to others.  Don't operate dangerous machinery, swim alone, or climb in high or dangerous places, such as on ladders, roofs, or girders.  Do not drive unless your doctor says you may.  3. If you have any warning that you may have a seizure, lay down in a safe place where you can't hurt yourself.    4.  No driving for 6 months from last seizure, as per [facility].   Please refer to the following link on the [employer]'s website for more information: http://www.epilepsyfoundation.org/answerplace/Social/driving/drivingu.cfm   5.  Maintain good sleep hygiene.  6.  Notify your neurology if you are planning pregnancy or if you become pregnant.  7.  Contact your doctor if you have any problems that may be related to the medicine you are taking.  8.  Call 911 and bring the patient back to the ED if:        A.  The seizure lasts longer than 5 minutes.       B.  The patient doesn't awaken shortly after the seizure  C.  The patient has new problems such as difficulty seeing, speaking or moving  D.  The patient was injured during the seizure  E.  The patient has a temperature over 102 F (39C)  F.  The patient vomited and now is having trouble breathing

## 2014-12-06 NOTE — Assessment & Plan Note (Signed)
New.  Noted on labs done at ER at time of seizure evaluation.  Will repeat today and replete K+ prn.  Pt expressed understanding and is in agreement w/ plan.

## 2014-12-06 NOTE — Assessment & Plan Note (Addendum)
New.  Reviewed pt's ER notes.  Pt denies recent ETOH use/withdrawal.  No change in meds.  No obvious cause and no hx of seizures.  Will refer to neuro for complete evaluation and tx.  Pt is aware that she is not to drive for 6 months and that the clock resets if she has another episode.  Check labs to r/o metabolic causes.  Will follow closely.

## 2014-12-07 ENCOUNTER — Other Ambulatory Visit: Payer: Self-pay

## 2014-12-10 ENCOUNTER — Ambulatory Visit (INDEPENDENT_AMBULATORY_CARE_PROVIDER_SITE_OTHER): Payer: 59 | Admitting: Neurology

## 2014-12-10 DIAGNOSIS — R569 Unspecified convulsions: Secondary | ICD-10-CM

## 2014-12-11 NOTE — Procedures (Signed)
ELECTROENCEPHALOGRAM REPORT  Date of Study: 12/10/2014  Patient's Name: Kristina Morris MRN: [id number] Date of Birth: 1965/05/22  Referring Provider: Dr. Patrcia Dolly  Clinical History: This is a 49 year old woman with new onset seizure.  Medications: Vistaril, vitamins  Technical Summary: A multichannel digital EEG recording measured by the international 10-20 system with electrodes applied with paste and impedances below 5000 ohms performed in our laboratory with EKG monitoring in an awake and asleep patient.  Hyperventilation and photic stimulation were performed.  The digital EEG was referentially recorded, reformatted, and digitally filtered in a variety of bipolar and referential montages for optimal display.    Description: The patient is awake and asleep during the recording.  During maximal wakefulness, there is a symmetric, medium voltage 10 Hz posterior dominant rhythm that attenuates with eye opening.  The record is symmetric.  During drowsiness and sleep, there is an increase in theta slowing of the background.  Vertex waves and symmetric sleep spindles were seen.  Hyperventilation and photic stimulation did not elicit any abnormalities.  There were no epileptiform discharges or electrographic seizures seen.    EKG lead was unremarkable.  Impression: This awake and asleep EEG is normal.    Clinical Correlation: A normal EEG does not exclude a clinical diagnosis of epilepsy.  If further clinical questions remain, prolonged EEG may be helpful.  Clinical correlation is advised.   Patrcia Dolly, M.D.

## 2014-12-14 ENCOUNTER — Other Ambulatory Visit: Payer: Self-pay

## 2014-12-14 ENCOUNTER — Inpatient Hospital Stay: Admission: RE | Admit: 2014-12-14 | Payer: Self-pay | Source: Ambulatory Visit

## 2014-12-16 ENCOUNTER — Telehealth: Payer: Self-pay | Admitting: Family Medicine

## 2014-12-16 NOTE — Telephone Encounter (Signed)
Patient was notified of result & advisement. 

## 2014-12-16 NOTE — Telephone Encounter (Signed)
-----   Message from Van Clines, MD sent at 12/15/2014  1:08 PM EDT ----- Pls let her know EEG is normal, proceed with MRI as scheduled. Thanks

## 2014-12-23 ENCOUNTER — Ambulatory Visit
Admission: RE | Admit: 2014-12-23 | Discharge: 2014-12-23 | Disposition: A | Discharge status: Home / Self Care | Payer: 59 | Source: Ambulatory Visit | Attending: Neurology | Admitting: Neurology

## 2014-12-23 MED ORDER — GADOBENATE DIMEGLUMINE 529 MG/ML IV SOLN
14.0000 mL | Freq: Once | INTRAVENOUS | Status: AC | PRN
  Administered 2014-12-23: 14 mL via INTRAVENOUS

## 2014-12-24 ENCOUNTER — Ambulatory Visit (INDEPENDENT_AMBULATORY_CARE_PROVIDER_SITE_OTHER): Payer: 59 | Admitting: Neurology

## 2014-12-24 ENCOUNTER — Encounter: Payer: Self-pay | Admitting: Neurology

## 2014-12-24 VITALS — BP 130/90 | HR 77 | Resp 16 | Ht 66.0 in | Wt 154.0 lb

## 2014-12-24 DIAGNOSIS — G4486 Cervicogenic headache: Secondary | ICD-10-CM

## 2014-12-24 DIAGNOSIS — R51 Headache: Secondary | ICD-10-CM | POA: Diagnosis not present

## 2014-12-24 DIAGNOSIS — M4802 Spinal stenosis, cervical region: Secondary | ICD-10-CM | POA: Diagnosis not present

## 2014-12-24 DIAGNOSIS — R569 Unspecified convulsions: Secondary | ICD-10-CM

## 2014-12-24 MED ORDER — CYCLOBENZAPRINE HCL 5 MG PO TABS: 5.0000 mg | ORAL_TABLET | Freq: Every day | ORAL | Status: DC

## 2014-12-24 NOTE — Progress Notes (Signed)
NEUROLOGY FOLLOW UP OFFICE NOTE  [facility] [id number]  HISTORY OF PRESENT ILLNESS: I had the pleasure of seeing Cierah Crader in follow-up in the neurology clinic on 12/24/2014.  The patient was last seen 3 weeks ago for new onset seizure last 11/30/14. She is accompanied by her husband and daughter who help supplement the history today. Records and images were personally reviewed where available.  I personally reviewed MRI brain with and without contrast which did not show any acute abnormality, hippocampi symmetric without abnormal signal or enhancement. MRI c-spine done for hyperreflexia showed spinal stenosis with mild mass effect at C6-7 without cord signal changes, there was foraminal stenosis at this level as well. Her routine wake and sleep EEG was normal.  She denies any further seizures since 11/30/14. Her family denies any staring/unresponsive episodes. She denies any olfactory/gustatory hallucinations, focal numbness/tingling/weakness. She has been having headaches for the past couple of years, starting at the base of her neck, with pressure radiating up, affecting her vision. No nausea/vomiting/photo/phonophobia. These occur around twice a week. She takes prn Advil. She denies any alcohol intake.  HPI: This is a pleasant 49 yo RH woman with a history of depression, anxiety, alcohol abuse, sober for 3 months, with new onset seizure last 11/30/2014. She reports feeling run down 3 days prior to the seizure, with pain in the right upper quadrant of her abdomen, attributed to a pulled muscle. She recalls looking at her daughter that evening, and feeling like a projector was putting what she saw on TV on the wall as she turned her head, then next thing she knew she was waking up to EMS around her. She did not recognize familiar people and was in and out in the ambulance. She did not recall any focal weakness, her whole body felt sore. She had bitten the sides of her tongue, no incontinence. She  was brought to [facility] ER where CBC and CMP were unremarkable except for potassium of 3.0. UDS and EtOH level negative. I personally reviewed head CT without contrast which did not show any acute changes. She was back to baseline and discharged home.  She reports an episode of loss of consciousness without convulsive activity after she was bit by a bee 7 years ago. She recalls feeing unwell and was told that she was unresponsive before she passed out. She denies any olfactory/gustatory hallucinations, deja vu, rising epigastric sensation, focal numbness/tingling/weakness, myoclonic jerks. She denies any alcohol intake since April. No sleep deprivation prior to the seizure. She has had headaches for the past couple of years, better with less stress. She denies any dizziness, diplopia, dysarthria, dysphagia, neck/back pain, focal numbness/tingling/weakness, bowel/bladder dysfunction. She had a normal birth and early development. There is no history of febrile convulsions, CNS infections such as meningitis/encephalitis, significant traumatic brain injury, neurosurgical procedures, or family history of seizures.  PAST MEDICAL HISTORY: Past Medical History  Diagnosis Date  . Mitral valve prolapse   . Seasonal allergies   . Chicken pox   . Depression   . Alcoholic   . Fatty liver   . Hyperlipidemia     MEDICATIONS: Current Outpatient Prescriptions on File Prior to Visit  Medication Sig Dispense Refill  . b complex vitamins tablet Take 1 tablet by mouth 2 (two) times a week.     Marland Kitchen BIOTIN PO Take 10,000 mcg by mouth daily.    Marland Kitchen EPINEPHrine 0.3 mg/0.3 mL IJ SOAJ injection Inject 0.3 mg into the muscle as needed.    Marland Kitchen  fexofenadine (ALLEGRA) 180 MG tablet Take 180 mg by mouth as needed for allergies.     . fluticasone (FLONASE) 50 MCG/ACT nasal spray Place 2 sprays into both nostrils daily as needed for allergies.     . folic acid (FOLVITE) 800 MCG tablet Take 800 mcg by mouth daily.    Marland Kitchen GILDESS FE 1/20  1-20 MG-MCG tablet Take 1 tablet by mouth daily.    . hydrOXYzine (VISTARIL) 25 MG capsule 1 tab Q4-6 prn and nightly for sleep (Patient taking differently: Take 25 mg by mouth 2 (two) times daily as needed for anxiety (takes in daytime as needed and takes every night for sleep). ) 90 capsule 1  . Multiple Vitamin (DAILY MULTIVITAMIN PO) Take 1 tablet by mouth daily.    . potassium chloride SA (K-DUR,KLOR-CON) 20 MEQ tablet Take 1 tablet (20 mEq total) by mouth daily. (Patient taking differently: Take 20 mEq by mouth 2 (two) times daily. ) 30 tablet 6  . sertraline (ZOLOFT) 100 MG tablet Take 1 tablet (100 mg total) by mouth daily. (Patient taking differently: Take 100 mg by mouth every evening. ) 30 tablet 3  . vitamin E 400 UNIT capsule Take 400 Units by mouth daily.    . calcium carbonate (OS-CAL) 600 MG TABS tablet Take 600 mg by mouth daily. + Vitamin D     No current facility-administered medications on file prior to visit.    ALLERGIES: Allergies  Allergen Reactions  . Avelox [Moxifloxacin Hcl In Nacl] Shortness Of Breath, Itching and Rash    GI Upset  . Bee Venom Anaphylaxis  . Doxycycline Nausea Only and Rash  . Amoxicillin Rash    Upset stomach   . Erythromycin Rash    Upset stomach  . Sulfa Antibiotics Rash    Upset stomach    FAMILY HISTORY: Family History  Problem Relation Age of Onset  . Depression Mother   . Liver disease Brother     older brother  . Liver disease Maternal Aunt   . Liver disease Paternal Aunt   . Alcoholism      both sides of family  . Diabetes Brother   . Hypertension Brother     older brother  . Breast cancer Mother   . Osteoporosis Mother   . Cancer      both sides of family  . Heart disease Father   . Heart disease Maternal Grandmother     SOCIAL HISTORY: Social History   Social History  . Marital Status: Married    Spouse Name: N/A  . Number of Children: N/A  . Years of Education: N/A   Occupational History  . Not on file.    Social History Main Topics  . Smoking status: Current Some Day Smoker    Types: Cigarettes  . Smokeless tobacco: Not on file     Comment: Smoke about 1-2 times a week  . Alcohol Use: No     Comment: History of Alcohol Abuse  . Drug Use: No  . Sexual Activity:    Partners: Male   Other Topics Concern  . Not on file   Social History Narrative    REVIEW OF SYSTEMS: Constitutional: No fevers, chills, or sweats, no generalized fatigue, change in appetite Eyes: blurred vision Ear, nose and throat: No hearing loss, ear pain, nasal congestion, sore throat Cardiovascular: No chest pain, palpitations Respiratory:  No shortness of breath at rest or with exertion, wheezes GastrointestinaI: No nausea, vomiting, diarrhea, abdominal pain, fecal incontinence  Genitourinary:  No dysuria, urinary retention or frequency Musculoskeletal:  + neck pain, back pain Integumentary: No rash, pruritus, skin lesions Neurological: as above Psychiatric: No depression, insomnia, anxiety Endocrine: No palpitations, fatigue, diaphoresis, mood swings, change in appetite, change in weight, increased thirst Hematologic/Lymphatic:  No anemia, purpura, petechiae. Allergic/Immunologic: no itchy/runny eyes, nasal congestion, recent allergic reactions, rashes  PHYSICAL EXAM: Filed Vitals:   12/24/14 1119  BP: 130/90  Pulse: 77  Resp: 16   General: No acute distress Head:  Normocephalic/atraumatic Neck: supple, no paraspinal tenderness, full range of motion Heart:  Regular rate and rhythm Lungs:  Clear to auscultation bilaterally Back: No paraspinal tenderness Skin/Extremities: No rash, no edema Neurological Exam: alert and oriented to person, place, and time. No aphasia or dysarthria. Fund of knowledge is appropriate.  Recent and remote memory are intact.  Attention and concentration are normal.    Able to name objects and repeat phrases. Cranial nerves: CN I: not tested CN II: pupils equal, round and  reactive to light, visual fields intact, fundi unremarkable. CN III, IV, VI: full range of motion, no nystagmus, no ptosis CN V: facial sensation intact CN VII: upper and lower face symmetric CN VIII: hearing intact to finger rub CN IX, X: gag intact, uvula midline CN XI: sternocleidomastoid and trapezius muscles intact CN XII: tongue midline Bulk & Tone: normal, no fasciculations. Motor: 5/5 throughout with no pronator drift. Sensation: intact to light touch. No extinction to double simultaneous stimulation. Romberg test negative Deep Tendon Reflexes: brisk +3 throughout with bilateral Hoffman sign, no ankle clonus (similar to prior) Plantar responses: downgoing bilaterally Cerebellar: no incoordination on finger to nose, heel to shin. No dysdiadochokinesia Gait: narrow-based and steady, able to tandem walk adequately. Tremor: none  IMPRESSION: This is a pleasant 49 yo RH woman with a history of depression, anxiety, alcohol abuse, sober since April, with new onset unprovoked seizure last 11/30/14. Her neurological exam is non-focal, again with note of hyperreflexia. MRI brain and routine EEG normal. Her MRI C-spine showed spinal stenosis without abnormal cord signal, foraminal stenosis at C6. We again discussed that after an initial seizure, unless there are significant risk factors, an abnormal neurological exam, an EEG showing epileptiform abnormalities, and/or abnormal neuroimaging, treatment with an antiepileptic drug is not indicated. She knows to call our office for any change in symptoms, at which point further testing such as prolonged EEG and/or medication will be considered. 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 We again discussed [place] driving laws that indicate that one should not drive after a seizure until 6 months seizure-free. She will follow-up in 3 months and knows to call our office for any change in symptoms.   Thank  you for allowing me to participate in her care.  Please do not hesitate to call for any questions or concerns.  The duration of this appointment visit was 25 minutes of face-to-face time with the patient.  Greater than 50% of this time was spent in counseling, explanation of diagnosis, planning of further management, and coordination of care.   Patrcia Dolly, M.D.   CC: Dr. Beverely Low

## 2014-12-24 NOTE — Patient Instructions (Signed)
1. Take Flexeril  at bedtime as needed for neck pain and headaches 2. Continue to monitor symptoms, call our office for any change 3. Follow-up in 3 months 4. As per [place] driving laws, no driving after a seizure, until 6 months seizure-free  Seizure Precautions: 1. If medication has been prescribed for you to prevent seizures, take it exactly as directed.  Do not stop taking the medicine without talking to your doctor first, even if you have not had a seizure in a long time.   2. Avoid activities in which a seizure would cause danger to yourself or to others.  Don't operate dangerous machinery, swim alone, or climb in high or dangerous places, such as on ladders, roofs, or girders.  Do not drive unless your doctor says you may.  3. If you have any warning that you may have a seizure, lay down in a safe place where you can't hurt yourself.    4.  No driving for 6 months from last seizure, as per [facility].   Please refer to the following link on the Epilepsy [employer] of America's website for more information: http://www.epilepsyfoundation.org/answerplace/Social/driving/drivingu.cfm   5.  Maintain good sleep hygiene.  6.  Contact your doctor if you have any problems that may be related to the medicine you are taking.  7.  Call 911 and bring the patient back to the ED if:        A.  The seizure lasts longer than 5 minutes.       B.  The patient doesn't awaken shortly after the seizure  C.  The patient has new problems such as difficulty seeing, speaking or moving  D.  The patient was injured during the seizure  E.  The patient has a temperature over 102 F (39C)  F.  The patient vomited and now is having trouble breathing

## 2015-02-01 ENCOUNTER — Telehealth: Payer: Self-pay | Admitting: Family Medicine

## 2015-02-01 MED ORDER — SERTRALINE HCL 100 MG PO TABS: 100.0000 mg | ORAL_TABLET | Freq: Every evening | ORAL | Status: AC

## 2015-02-01 NOTE — Telephone Encounter (Signed)
Last V 12/04/14 zoloft last filled 09/28/14 #30 with 3

## 2015-02-01 NOTE — Telephone Encounter (Signed)
Pt called for refill on zoloft. She has a few days left. Pt is unable to drive until January and her mother is bringing her back and forth and has trouble getting her here. Pharmacy: [facility] on [address]

## 2015-02-01 NOTE — Telephone Encounter (Signed)
Medication filled to pharmacy as requested.   

## 2015-02-01 NOTE — Telephone Encounter (Signed)
Ok for #30, 6 refills 

## 2015-02-08 ENCOUNTER — Telehealth: Payer: Self-pay | Admitting: Family Medicine

## 2015-02-08 NOTE — Telephone Encounter (Signed)
Noted.  Spoke with patient who states that she's mostly sore.  She says that she's not short of breath, but pain/soreness exacerbates whenever she takes a deep breath.  She says that she has been taking a muscle relaxer, ibuprofen and ice to help make her comfortable and to help her to sleep.   Pain is not relieved though.  Pt was advised that if symptoms worsen or new symptoms develop to go to the ER.  She stated understanding and agreed.  Note routed to Dr. Beverely Low for FYI.

## 2015-02-08 NOTE — Telephone Encounter (Signed)
Agree w/ advice given.  If sxs suddenly worsen or pain is severe, needs to go to ER.  Otherwise, she can schedule an OV for evaluation

## 2015-02-08 NOTE — Telephone Encounter (Signed)
Patient Name: Kristina Morris DOB: [birth year] Initial Comment Caller states having chest pain and sob Nurse Assessment Nurse: Yetta Barre, RN, Miranda Date/Time (Eastern Time): 02/08/2015 8:17:09 AM Confirm and document reason for call. If symptomatic, describe symptoms. ---Caller states she fell 3 nights ago and has a bruise on the right side of chest and having pain on the left side of her chest. Pain is worse when she takes a deep breath. Has the patient traveled out of the country within the last 30 days? ---Not Applicable Does the patient require triage? ---Yes Related visit to physician within the last 2 weeks? ---No Does the PT have any chronic conditions? (i.e. diabetes, asthma, etc.) ---Yes List chronic conditions. ---Seizures, neck pain. Depression Did the patient indicate they were pregnant? ---No Guidelines Guideline Title Affirmed Question Affirmed Notes Chest Injury [1] After 72 hours AND [2] chest pain not improving Final Disposition User See Physician within 24 Hours Yetta Barre, RN, Miranda Comments Appt scheduled for tomorrow 9/27 at 9:45am with Dr. Beverely Low Referrals REFERRED TO PCP OFFICE Disagree/Comply: Comply

## 2015-02-08 NOTE — Telephone Encounter (Signed)
Pt states her dog pulled her down last Thursday and she has been short of breath and chest hurting over the weekend. Has used ice packs on chest. Transferred to [facility] with [facility].

## 2015-02-09 ENCOUNTER — Ambulatory Visit (INDEPENDENT_AMBULATORY_CARE_PROVIDER_SITE_OTHER): Payer: 59 | Admitting: Family Medicine

## 2015-02-09 ENCOUNTER — Ambulatory Visit (HOSPITAL_BASED_OUTPATIENT_CLINIC_OR_DEPARTMENT_OTHER)
Admission: RE | Admit: 2015-02-09 | Discharge: 2015-02-09 | Disposition: A | Discharge status: Home / Self Care | Payer: 59 | Source: Ambulatory Visit | Attending: Family Medicine | Admitting: Family Medicine

## 2015-02-09 ENCOUNTER — Encounter: Payer: Self-pay | Admitting: Family Medicine

## 2015-02-09 ENCOUNTER — Other Ambulatory Visit: Payer: Self-pay | Admitting: Family Medicine

## 2015-02-09 VITALS — BP 126/80 | HR 98 | Temp 98.1°F | Resp 16 | Wt 150.1 lb

## 2015-02-09 DIAGNOSIS — R0781 Pleurodynia: Secondary | ICD-10-CM | POA: Insufficient documentation

## 2015-02-09 DIAGNOSIS — S2001XA Contusion of right breast, initial encounter: Secondary | ICD-10-CM | POA: Diagnosis not present

## 2015-02-09 DIAGNOSIS — W1830XA Fall on same level, unspecified, initial encounter: Secondary | ICD-10-CM

## 2015-02-09 MED ORDER — TRAMADOL HCL 50 MG PO TABS: 50.0000 mg | ORAL_TABLET | Freq: Three times a day (TID) | ORAL | Status: DC | PRN

## 2015-02-09 NOTE — Telephone Encounter (Signed)
Pt seen by Dr. Beverely Low today (02/09/15). See office notes for details.

## 2015-02-09 NOTE — Progress Notes (Signed)
   Subjective:    Patient ID: Kristina Morris, female    DOB: 1965-08-06, 49 y.o.   MRN: [id number]  HPI Fall- pt was walking dog when it took off running and pulled her over.  She did not brace herself with her other hand and hit chest and chin on the ground.  Fall occurred Thursday 9/22.  Has been taking muscle relaxers given by neuro.  Taking 2 ibuprofen 3x/day w/o relief.  Pt has been icing L chest 'almost constantly'.  Pt has marked bruising of chin, R breast.  No bruising of L breast but pain is localized to L chest and radiating posteriorly around ribs.   Review of Systems For ROS see HPI     Objective:   Physical Exam  Constitutional: She is oriented to person, place, and time. She appears well-developed and well-nourished. No distress.  HENT:  Head: Normocephalic.  Mouth/Throat: Oropharynx is clear and moist.  Marked bruising under chin  Eyes: Conjunctivae and EOM are normal. Pupils are equal, round, and reactive to light.  Neck: Normal range of motion. Neck supple.  Pulmonary/Chest: She exhibits tenderness (TTP over L anterior ribs).  R breast hematoma w/o evidence of cellulitis  Lymphadenopathy:    She has no cervical adenopathy.  Neurological: She is alert and oriented to person, place, and time. No cranial nerve deficit. Coordination normal.  Skin: Skin is warm and dry.  Psychiatric: She has a normal mood and affect. Her behavior is normal. Thought content normal.  Vitals reviewed.         Assessment & Plan:

## 2015-02-09 NOTE — Assessment & Plan Note (Signed)
New.  No evidence of secondary infxn.  No TTP despite extensive bruising.  Discussed applying heat to help dissipate hematoma.  Pt expressed understanding and is in agreement w/ plan.

## 2015-02-09 NOTE — Assessment & Plan Note (Signed)
New.  Pt's dog pulled her onto chest and face on 9/22.  Pt has extensive bruising of chin, R breast, and TTP over L anterior ribs w/ painful inspiration.  Will get xrays to r/o rib fx.  Start pain meds prn.  Continue NSAIDs and Flexeril.  Reviewed supportive care and red flags that should prompt return.  Pt expressed understanding and is in agreement w/ plan.

## 2015-02-09 NOTE — Patient Instructions (Signed)
Follow up as needed Go downstairs and get your xrays Alternate heat/ice for pain relief Continue the flexeril for muscle spasm Tramadol for pain relief as needed Continue ibuprofen- 3 tabs 3x/day w/ food- for pain/inflammation Call with any questions or concerns Hang in there!

## 2015-02-09 NOTE — Assessment & Plan Note (Signed)
New.  Get xrays to r/o rib fx.  Continue NSAIDs.  Tramadol as needed for severe pain.  Reviewed supportive care and red flags that should prompt return.  Pt expressed understanding and is in agreement w/ plan.

## 2015-02-09 NOTE — Progress Notes (Signed)
Pre visit review using our clinic review tool, if applicable. No additional management support is needed unless otherwise documented below in the visit note. 

## 2015-02-16 ENCOUNTER — Telehealth: Payer: Self-pay | Admitting: Family Medicine

## 2015-02-16 MED ORDER — TRAMADOL HCL 50 MG PO TABS: 50.0000 mg | ORAL_TABLET | Freq: Three times a day (TID) | ORAL | Status: DC | PRN

## 2015-02-16 NOTE — Telephone Encounter (Signed)
Caller name: Kynnadi Relation to pt: Self Call back number: [phone] Pharmacy:[id number] - [place], [place] - [id number]  Reason for call: Pt came in office with mother Jerene Bears) who had an appt today and stated was seen by Dr. Beverely Low on 02-09-15 for chest injury, pt states still in pain for the same situation and is wanting to know if she can have a refill on her rx for Tramadol HCL 50 mg, pt brought in rx stating still have some meds left but does not have transportation to come back to office to pick up for a new rx and wants to know if she can have rx done today since already at office. Please advise.

## 2015-02-16 NOTE — Telephone Encounter (Signed)
Medication filled to pharmacy as requested.   

## 2015-02-16 NOTE — Telephone Encounter (Signed)
Ok for Tramadol #30

## 2015-03-24 ENCOUNTER — Telehealth: Payer: Self-pay | Admitting: Neurology

## 2015-03-24 NOTE — Telephone Encounter (Signed)
Would restart all her medications, she may be having some withdrawal symptoms. If symptoms worsen or progress, go to ER immediately. Thanks

## 2015-03-24 NOTE — Telephone Encounter (Signed)
I spoke with patient. She states that recently she has had a few episodes where her hands and arms were numb & tingling, some weakness, but no pain. She states she hasn't had any seizures. She states she hasn't been taking her regular medications for several days due to being sick with a cold. Any advisement?

## 2015-03-24 NOTE — Telephone Encounter (Signed)
Pt is having some weird feelings and numbness in hands and arms please call [phone]

## 2015-03-24 NOTE — Telephone Encounter (Signed)
Pt called to inform that she is having tingling in hands and legs/call back @ [phone]

## 2015-03-25 NOTE — Telephone Encounter (Signed)
Left msg for patient to return my call

## 2015-03-28 ENCOUNTER — Other Ambulatory Visit: Payer: Self-pay | Admitting: Family Medicine

## 2015-03-29 NOTE — Telephone Encounter (Signed)
Medication filled to pharmacy as requested.   

## 2015-03-29 NOTE — Telephone Encounter (Signed)
Last OV 02/09/15 Tramadol last filled 02/16/15 #30 with 0

## 2015-03-30 ENCOUNTER — Ambulatory Visit: Payer: 59 | Admitting: Neurology

## 2015-04-30 ENCOUNTER — Encounter: Payer: Self-pay | Admitting: Family Medicine

## 2015-04-30 ENCOUNTER — Ambulatory Visit (INDEPENDENT_AMBULATORY_CARE_PROVIDER_SITE_OTHER): Payer: 59 | Admitting: Family Medicine

## 2015-04-30 VITALS — BP 122/82 | HR 90 | Temp 98.2°F | Resp 18 | Ht 66.0 in | Wt 154.2 lb

## 2015-04-30 DIAGNOSIS — J0100 Acute maxillary sinusitis, unspecified: Secondary | ICD-10-CM

## 2015-04-30 DIAGNOSIS — J019 Acute sinusitis, unspecified: Secondary | ICD-10-CM | POA: Insufficient documentation

## 2015-04-30 MED ORDER — PROMETHAZINE-DM 6.25-15 MG/5ML PO SYRP: 5.0000 mL | ORAL_SOLUTION | Freq: Four times a day (QID) | ORAL | Status: DC | PRN

## 2015-04-30 MED ORDER — CEFUROXIME AXETIL 250 MG PO TABS: 250.0000 mg | ORAL_TABLET | Freq: Two times a day (BID) | ORAL | Status: DC

## 2015-04-30 NOTE — Assessment & Plan Note (Signed)
New.  Pt's sxs and PE consistent w/ infxn.  Start abx.  Cough meds.  Reviewed supportive care and red flags that should prompt return.  Pt expressed understanding and is in agreement w/ plan.

## 2015-04-30 NOTE — Patient Instructions (Signed)
Follow up as needed Start the Ceftin twice daily- take w/ food Drink plenty of fluids Mucinex DM for daytime cough Promethazine cough syrup for nights/weekends- will cause drowsiness REST! Call with any questions or concerns Happy Holidays!!!

## 2015-04-30 NOTE — Progress Notes (Signed)
   Subjective:    Patient ID: Kristina Morris, female    DOB: 04-17-1966, 49 y.o.   MRN: [id number]  HPI URI- pt reports she had flu shot 3 weeks ago and that night developed fever, body aches, and chills for 3 days.  Currently has HA, nasal congestion, ear fullness, intermittent cough.  Cough is rarely productive.  No recent fever.  + sinus pressure.  No known sick contacts.   Review of Systems For ROS see HPI     Objective:   Physical Exam  Constitutional: She is oriented to person, place, and time. She appears well-developed and well-nourished. No distress.  HENT:  Head: Normocephalic and atraumatic.  Right Ear: Tympanic membrane normal.  Left Ear: Tympanic membrane normal.  Nose: Mucosal edema and rhinorrhea present. Right sinus exhibits maxillary sinus tenderness and frontal sinus tenderness. Left sinus exhibits maxillary sinus tenderness and frontal sinus tenderness.  Mouth/Throat: Uvula is midline and mucous membranes are normal. Posterior oropharyngeal erythema present. No oropharyngeal exudate.  Eyes: Conjunctivae and EOM are normal. Pupils are equal, round, and reactive to light.  Neck: Normal range of motion. Neck supple.  Cardiovascular: Normal rate, regular rhythm and normal heart sounds.   Pulmonary/Chest: Effort normal and breath sounds normal. No respiratory distress. She has no wheezes.  Lymphadenopathy:    She has no cervical adenopathy.  Neurological: She is alert and oriented to person, place, and time.  Skin: Skin is warm and dry.  Vitals reviewed.         Assessment & Plan:

## 2015-04-30 NOTE — Progress Notes (Signed)
Pre visit review using our clinic review tool, if applicable. No additional management support is needed unless otherwise documented below in the visit note. 

## 2015-05-14 ENCOUNTER — Telehealth: Payer: Self-pay | Admitting: Family Medicine

## 2015-05-14 NOTE — Telephone Encounter (Signed)
Called pt and left a detailed message on voicemail notifying that PCP was contacted on her cell and had advised thaht she will not fill zpak for pt. Pt would have to be reevaluated before any medications can be filled. Advised pt on voicemail of this and gave information and contact information for Saturday clinic.

## 2015-05-14 NOTE — Telephone Encounter (Signed)
Caller name: Self   Can be reached: [phone]  Pharmacy:  [facility] - [place], [place] - [address] [phone] (Phone) [phone] (Fax)         Reason for call: Patient called to request Z-Pak called in because she is not getting any better and Tabori told her to call back if she was not getting better

## 2015-05-14 NOTE — Telephone Encounter (Signed)
Please advise 

## 2015-07-07 ENCOUNTER — Ambulatory Visit: Payer: 59 | Admitting: Neurology

## 2015-10-27 ENCOUNTER — Emergency Department (HOSPITAL_COMMUNITY): Payer: 59

## 2015-10-27 ENCOUNTER — Inpatient Hospital Stay (HOSPITAL_COMMUNITY)
Admission: EM | Admit: 2015-10-27 | Discharge: 2015-10-31 | DRG: 433 | Disposition: A | Discharge status: Home / Self Care | Payer: 59 | Attending: Internal Medicine | Admitting: Internal Medicine

## 2015-10-27 ENCOUNTER — Encounter (HOSPITAL_COMMUNITY): Payer: Self-pay

## 2015-10-27 DIAGNOSIS — E785 Hyperlipidemia, unspecified: Secondary | ICD-10-CM | POA: Diagnosis present

## 2015-10-27 DIAGNOSIS — R188 Other ascites: Secondary | ICD-10-CM

## 2015-10-27 DIAGNOSIS — K766 Portal hypertension: Secondary | ICD-10-CM | POA: Diagnosis present

## 2015-10-27 DIAGNOSIS — Z882 Allergy status to sulfonamides status: Secondary | ICD-10-CM

## 2015-10-27 DIAGNOSIS — F1020 Alcohol dependence, uncomplicated: Secondary | ICD-10-CM

## 2015-10-27 DIAGNOSIS — K8020 Calculus of gallbladder without cholecystitis without obstruction: Secondary | ICD-10-CM | POA: Diagnosis present

## 2015-10-27 DIAGNOSIS — D649 Anemia, unspecified: Secondary | ICD-10-CM | POA: Diagnosis present

## 2015-10-27 DIAGNOSIS — K7200 Acute and subacute hepatic failure without coma: Secondary | ICD-10-CM

## 2015-10-27 DIAGNOSIS — R000 Tachycardia, unspecified: Secondary | ICD-10-CM | POA: Diagnosis present

## 2015-10-27 DIAGNOSIS — I341 Nonrheumatic mitral (valve) prolapse: Secondary | ICD-10-CM | POA: Diagnosis present

## 2015-10-27 DIAGNOSIS — Z880 Allergy status to penicillin: Secondary | ICD-10-CM

## 2015-10-27 DIAGNOSIS — Z8249 Family history of ischemic heart disease and other diseases of the circulatory system: Secondary | ICD-10-CM | POA: Diagnosis not present

## 2015-10-27 DIAGNOSIS — D6959 Other secondary thrombocytopenia: Secondary | ICD-10-CM | POA: Diagnosis present

## 2015-10-27 DIAGNOSIS — R791 Abnormal coagulation profile: Secondary | ICD-10-CM | POA: Diagnosis not present

## 2015-10-27 DIAGNOSIS — R1033 Periumbilical pain: Secondary | ICD-10-CM | POA: Diagnosis present

## 2015-10-27 DIAGNOSIS — K7040 Alcoholic hepatic failure without coma: Principal | ICD-10-CM | POA: Diagnosis present

## 2015-10-27 DIAGNOSIS — Z886 Allergy status to analgesic agent status: Secondary | ICD-10-CM | POA: Diagnosis not present

## 2015-10-27 DIAGNOSIS — K7460 Unspecified cirrhosis of liver: Secondary | ICD-10-CM

## 2015-10-27 DIAGNOSIS — K7011 Alcoholic hepatitis with ascites: Secondary | ICD-10-CM

## 2015-10-27 DIAGNOSIS — D638 Anemia in other chronic diseases classified elsewhere: Secondary | ICD-10-CM

## 2015-10-27 DIAGNOSIS — F17210 Nicotine dependence, cigarettes, uncomplicated: Secondary | ICD-10-CM | POA: Diagnosis present

## 2015-10-27 DIAGNOSIS — K7031 Alcoholic cirrhosis of liver with ascites: Secondary | ICD-10-CM | POA: Diagnosis present

## 2015-10-27 DIAGNOSIS — E876 Hypokalemia: Secondary | ICD-10-CM | POA: Diagnosis present

## 2015-10-27 DIAGNOSIS — Z91030 Bee allergy status: Secondary | ICD-10-CM

## 2015-10-27 DIAGNOSIS — E871 Hypo-osmolality and hyponatremia: Secondary | ICD-10-CM | POA: Diagnosis present

## 2015-10-27 DIAGNOSIS — Z79899 Other long term (current) drug therapy: Secondary | ICD-10-CM | POA: Diagnosis not present

## 2015-10-27 DIAGNOSIS — R112 Nausea with vomiting, unspecified: Secondary | ICD-10-CM

## 2015-10-27 DIAGNOSIS — K760 Fatty (change of) liver, not elsewhere classified: Secondary | ICD-10-CM | POA: Diagnosis present

## 2015-10-27 DIAGNOSIS — R17 Unspecified jaundice: Secondary | ICD-10-CM

## 2015-10-27 DIAGNOSIS — D696 Thrombocytopenia, unspecified: Secondary | ICD-10-CM

## 2015-10-27 LAB — CBC
HCT: 24.5 % — ABNORMAL LOW (ref 36.0–46.0)
Hemoglobin: 8.6 g/dL — ABNORMAL LOW (ref 12.0–15.0)
MCH: 39.3 pg — ABNORMAL HIGH (ref 26.0–34.0)
MCHC: 35.1 g/dL (ref 30.0–36.0)
MCV: 111.9 fL — ABNORMAL HIGH (ref 78.0–100.0)
Platelets: 44 10*3/uL — ABNORMAL LOW (ref 150–400)
RBC: 2.19 MIL/uL — AB (ref 3.87–5.11)
RDW: 17.9 % — ABNORMAL HIGH (ref 11.5–15.5)
WBC: 7.9 10*3/uL (ref 4.0–10.5)

## 2015-10-27 LAB — URINE MICROSCOPIC-ADD ON

## 2015-10-27 LAB — APTT: APTT: 49 s — AB (ref 24–37)

## 2015-10-27 LAB — COMPREHENSIVE METABOLIC PANEL WITH GFR
ALT: 37 U/L (ref 14–54)
AST: 220 U/L — ABNORMAL HIGH (ref 15–41)
Albumin: 2.4 g/dL — ABNORMAL LOW (ref 3.5–5.0)
Alkaline Phosphatase: 112 U/L (ref 38–126)
Anion gap: 14 (ref 5–15)
BUN: 8 mg/dL (ref 6–20)
CO2: 27 mmol/L (ref 22–32)
Calcium: 8.4 mg/dL — ABNORMAL LOW (ref 8.9–10.3)
Chloride: 95 mmol/L — ABNORMAL LOW (ref 101–111)
Creatinine, Ser: <0.3 mg/dL — ABNORMAL LOW (ref 0.44–1.00)
Glucose, Bld: 113 mg/dL — ABNORMAL HIGH (ref 65–99)
Potassium: 2.8 mmol/L — ABNORMAL LOW (ref 3.5–5.1)
Sodium: 136 mmol/L (ref 135–145)
Total Bilirubin: 18.3 mg/dL (ref 0.3–1.2)
Total Protein: 7.6 g/dL (ref 6.5–8.1)

## 2015-10-27 LAB — URINALYSIS, ROUTINE W REFLEX MICROSCOPIC
Glucose, UA: NEGATIVE mg/dL
Hgb urine dipstick: NEGATIVE
Ketones, ur: 40 mg/dL — AB
Nitrite: POSITIVE — AB
Protein, ur: NEGATIVE mg/dL
Specific Gravity, Urine: 1.024 (ref 1.005–1.030)
pH: 5.5 (ref 5.0–8.0)

## 2015-10-27 LAB — LIPASE, BLOOD: Lipase: 51 U/L (ref 11–51)

## 2015-10-27 LAB — ETHANOL: Alcohol, Ethyl (B): 12 mg/dL — ABNORMAL HIGH (ref <5)

## 2015-10-27 LAB — PROTIME-INR
INR: 1.86 — AB (ref 0.00–1.49)
Prothrombin Time: 20.8 seconds — ABNORMAL HIGH (ref 11.6–15.2)

## 2015-10-27 MED ORDER — TRAMADOL HCL 50 MG PO TABS
50.0000 mg | ORAL_TABLET | Freq: Two times a day (BID) | ORAL | Status: DC | PRN
  Administered 2015-10-27 – 2015-10-30 (×6): 50 mg via ORAL
  Filled 2015-10-27 (×7): qty 1

## 2015-10-27 MED ORDER — POTASSIUM CHLORIDE CRYS ER 20 MEQ PO TBCR
40.0000 meq | EXTENDED_RELEASE_TABLET | Freq: Once | ORAL | Status: AC
  Administered 2015-10-27: 40 meq via ORAL
  Filled 2015-10-27: qty 2

## 2015-10-27 MED ORDER — THIAMINE HCL 100 MG/ML IJ SOLN
100.0000 mg | Freq: Once | INTRAMUSCULAR | Status: AC
  Administered 2015-10-27: 100 mg via INTRAVENOUS
  Filled 2015-10-27: qty 2

## 2015-10-27 MED ORDER — IOPAMIDOL (ISOVUE-300) INJECTION 61%
100.0000 mL | Freq: Once | INTRAVENOUS | Status: AC | PRN
  Administered 2015-10-27: 100 mL via INTRAVENOUS

## 2015-10-27 MED ORDER — FOLIC ACID 1 MG PO TABS
1.0000 mg | ORAL_TABLET | Freq: Every day | ORAL | Status: DC
  Administered 2015-10-27 – 2015-10-31 (×5): 1 mg via ORAL
  Filled 2015-10-27 (×6): qty 1

## 2015-10-27 MED ORDER — CYCLOBENZAPRINE HCL 5 MG PO TABS
5.0000 mg | ORAL_TABLET | Freq: Three times a day (TID) | ORAL | Status: DC | PRN
  Administered 2015-10-27 – 2015-10-29 (×3): 5 mg via ORAL
  Filled 2015-10-27 (×3): qty 1

## 2015-10-27 MED ORDER — ONDANSETRON 4 MG PO TBDP
4.0000 mg | ORAL_TABLET | Freq: Once | ORAL | Status: AC | PRN
  Administered 2015-10-27: 4 mg via ORAL
  Filled 2015-10-27: qty 1

## 2015-10-27 MED ORDER — SODIUM CHLORIDE 0.9% FLUSH
3.0000 mL | Freq: Two times a day (BID) | INTRAVENOUS | Status: DC
  Administered 2015-10-27 – 2015-10-30 (×6): 3 mL via INTRAVENOUS

## 2015-10-27 MED ORDER — PANTOPRAZOLE SODIUM 40 MG PO TBEC
40.0000 mg | DELAYED_RELEASE_TABLET | Freq: Every day | ORAL | Status: DC
  Administered 2015-10-27 – 2015-10-31 (×5): 40 mg via ORAL
  Filled 2015-10-27 (×6): qty 1

## 2015-10-27 MED ORDER — VITAMIN B-1 100 MG PO TABS
100.0000 mg | ORAL_TABLET | Freq: Every day | ORAL | Status: DC
  Administered 2015-10-27 – 2015-10-31 (×5): 100 mg via ORAL
  Filled 2015-10-27 (×6): qty 1

## 2015-10-27 MED ORDER — CEFTRIAXONE SODIUM 1 G IJ SOLR
1.0000 g | INTRAMUSCULAR | Status: DC
  Administered 2015-10-27 – 2015-10-28 (×2): 1 g via INTRAVENOUS
  Filled 2015-10-27 (×3): qty 10

## 2015-10-27 MED ORDER — SODIUM CHLORIDE 0.9 % IV BOLUS (SEPSIS)
500.0000 mL | Freq: Once | INTRAVENOUS | Status: AC
  Administered 2015-10-27: 500 mL via INTRAVENOUS

## 2015-10-27 NOTE — ED Notes (Signed)
I was able to get the ethanol level. I was unable to get the rest of patient's pending blood. RN Susy FrizzleMatt going to start an IV

## 2015-10-27 NOTE — ED Notes (Signed)
Attempted to call report to rn, will call back in 5 min

## 2015-10-27 NOTE — ED Provider Notes (Signed)
CSN: [id number]     Arrival date & time 10/27/15  1129 History   First MD Initiated Contact with Patient 10/27/15 1502     Chief Complaint  Patient presents with  . Emesis  . Abdominal Pain  . Jaundice    Kristina Morris is a 50 y.o. female who presents to the ED Complaining of upper abdominal pain, nausea, vomiting and abdominal distention for almost 1 week now. Patient reports she first noticed her symptoms 6 days ago and remarks that her skin has been turning a different color. She complains of epigastric and RUQ abdominal pain.  Patient reports she has not been eating well lately. She's been stressed about her son. Patient reports she has a history of alcoholism and last drank alcohol 2 months ago. She denies any recent alcohol use. She denies any history of hepatic failure. She does report she's had problems with fatty liver disease in the past. Patient reports her urine has been red in color. She denies any urinary symptoms. Patient denies fevers, chills, urinary symptoms, lower abdominal pain, hematemesis, hematochezia, chest pain, shortness of breath.  Patient is a 50 y.o. female presenting with vomiting and abdominal pain. The history is provided by the patient. No language interpreter was used.  Emesis Associated symptoms: abdominal pain   Associated symptoms: no chills, no diarrhea, no headaches and no sore throat   Abdominal Pain Associated symptoms: nausea and vomiting   Associated symptoms: no chest pain, no chills, no cough, no diarrhea, no dysuria, no fever, no shortness of breath and no sore throat     Past Medical History  Diagnosis Date  . Mitral valve prolapse   . Seasonal allergies   . Chicken pox   . Depression   . Alcoholic (HCC)   . Fatty liver   . Hyperlipidemia    Past Surgical History  Procedure Laterality Date  . Ovarian cyst removal    . 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 Cysts removal, sinus    . Tonsillectomy and adenoidectomy     Family History  Problem Relation  Age of Onset  . Depression Mother   . Liver disease Brother     older brother  . Liver disease Maternal Aunt   . Liver disease Paternal Aunt   . Alcoholism      both sides of family  . Diabetes Brother   . Hypertension Brother     older brother  . Breast cancer Mother   . Osteoporosis Mother   . Cancer      both sides of family  . Heart disease Father   . Heart disease Maternal Grandmother    Social History  Substance Use Topics  . Smoking status: Current Some Day Smoker    Types: Cigarettes  . Smokeless tobacco: Never Used     Comment: Smoke about 1-2 times a week  . Alcohol Use: 0.0 oz/week    0 Standard drinks or equivalent per week     Comment: Patient states she has not drank since February/2017   OB History    No data available     Review of Systems  Constitutional: Negative for fever and chills.  HENT: Negative for congestion and sore throat.   Eyes: Negative for visual disturbance.  Respiratory: Negative for cough, shortness of breath and wheezing.   Cardiovascular: Positive for palpitations. Negative for chest pain.  Gastrointestinal: Positive for nausea, vomiting and abdominal pain. Negative for diarrhea and blood in stool.  Genitourinary: Negative  for dysuria, urgency, frequency, flank pain and difficulty urinating.  Musculoskeletal: Negative for back pain and neck pain.  Skin: Negative for rash.  Neurological: Negative for light-headedness and headaches.      Allergies  Avelox; Bee venom; Doxycycline; Amoxicillin; Erythromycin; and Sulfa antibiotics  Home Medications   Prior to Admission medications   Medication Sig Start Date End Date Taking? Authorizing Provider  b complex vitamins tablet Take 1 tablet by mouth 2 (two) times a week.    Yes Historical Provider, MD  BIOTIN PO Take 10,000 mcg by mouth daily.   Yes Historical Provider, MD  calcium carbonate (OS-CAL) 600 MG TABS tablet Take 600 mg by mouth daily. + Vitamin D   Yes Historical Provider,  MD  cyclobenzaprine (FLEXERIL) 5 MG tablet Take 1 tablet (5 mg total) by mouth at bedtime. Patient taking differently: Take 5 mg by mouth 3 (three) times daily as needed for muscle spasms.  12/24/14  Yes Van Clines, MD  fluticasone [facility]) 50 MCG/ACT nasal spray Place 2 sprays into both nostrils daily as needed for allergies.    Yes Historical Provider, MD  folic acid (FOLVITE) 800 MCG tablet Take 800 mcg by mouth daily.   Yes Historical Provider, MD  GILDESS FE 1/20 1-20 MG-MCG tablet Take 1 tablet by mouth daily. 08/20/13  Yes Historical Provider, MD  hydrOXYzine (VISTARIL) 25 MG capsule 1 tab Q4-6 prn and nightly for sleep Patient taking differently: Take 25 mg by mouth 2 (two) times daily as needed for anxiety (takes in daytime as needed and takes every night for sleep).  09/28/14  Yes Sheliah Hatch, MD  ibuprofen (ADVIL,MOTRIN) 200 MG tablet Take 200 mg by mouth every 6 (six) hours as needed for moderate pain.   Yes Historical Provider, MD  Multiple Vitamin (DAILY MULTIVITAMIN PO) Take 1 tablet by mouth daily.   Yes Historical Provider, MD  potassium chloride SA (K-DUR,KLOR-CON) 20 MEQ tablet Take 1 tablet (20 mEq total) by mouth daily. Patient taking differently: Take 20 mEq by mouth 2 (two) times daily.  12/02/14  Yes Sheliah Hatch, MD  sertraline (ZOLOFT) 100 MG tablet Take 1 tablet (100 mg total) by mouth every evening. 02/01/15  Yes Sheliah Hatch, MD  traMADol (ULTRAM) 50 MG tablet take 1 tablet by mouth every 8 hours if needed Patient taking differently: take 1 tablet by mouth every 8 hours if needed for pain 03/29/15  Yes Sheliah Hatch, MD  vitamin E 400 UNIT capsule Take 400 Units by mouth daily.   Yes Historical Provider, MD  cefUROXime (CEFTIN) 250 MG tablet Take 1 tablet (250 mg total) by mouth 2 (two) times daily with a meal. Patient not taking: Reported on 10/27/2015 04/30/15   Sheliah Hatch, MD  EPINEPHrine 0.3 mg/0.3 mL IJ SOAJ injection Inject 0.3 mg  into the muscle as needed.    Historical Provider, MD  fexofenadine (ALLEGRA) 180 MG tablet Take 180 mg by mouth as needed for allergies. Reported on 10/27/2015    Historical Provider, MD  promethazine-dextromethorphan (PROMETHAZINE-DM) 6.25-15 MG/5ML syrup Take 5 mLs by mouth 4 (four) times daily as needed. Patient not taking: Reported on 10/27/2015 04/30/15   Sheliah Hatch, MD   BP 122/75 mmHg  Pulse 123  Temp(Src) 98.4 F (36.9 C) (Oral)  Resp 20  Ht 5\' 6"  (1.676 m)  Wt 63.504 kg  BMI 22.61 kg/m2  SpO2 96% Physical Exam  Constitutional: She appears well-developed. No distress.  Nontoxic appearing. Jaundiced.  HENT:  Head: Normocephalic and atraumatic.  Oropharynx is clear. Mucous membranes are slightly dry.  Eyes: Conjunctivae are normal. Pupils are equal, round, and reactive to light. Right eye exhibits no discharge. Left eye exhibits no discharge. Scleral icterus is present.  Neck: Neck supple.  Cardiovascular: Regular rhythm, normal heart sounds and intact distal pulses.  Exam reveals no gallop and no friction rub.   No murmur heard. Heart rate is 116.  Pulmonary/Chest: Effort normal and breath sounds normal. No respiratory distress. She has no wheezes. She has no rales.  Lungs are clear to auscultation bilaterally.  Abdominal: Soft. Bowel sounds are normal. She exhibits mass. There is tenderness.  Hepatomegaly. Abdomen is soft. Slightly distended. Mild epigastric and right upper quadrant tenderness to palpation.  Musculoskeletal: She exhibits no edema or tenderness.  No lower extremity edema or tenderness.  Lymphadenopathy:    She has no cervical adenopathy.  Neurological: She is alert. Coordination normal.  Skin: Skin is warm and dry. No rash noted. She is not diaphoretic. No erythema. No pallor.  Psychiatric: She has a normal mood and affect. Her behavior is normal.  Nursing note and vitals reviewed.   ED Course  Procedures (including critical care time) Labs  Review Labs Reviewed  COMPREHENSIVE METABOLIC PANEL - Abnormal; Notable for the following:    Potassium 2.8 (*)    Chloride 95 (*)    Glucose, Bld 113 (*)    Creatinine, Ser <0.30 (*)    Calcium 8.4 (*)    Albumin 2.4 (*)    AST 220 (*)    Total Bilirubin 18.3 (*)    All other components within normal limits  CBC - Abnormal; Notable for the following:    RBC 2.19 (*)    Hemoglobin 8.6 (*)    HCT 24.5 (*)    MCV 111.9 (*)    MCH 39.3 (*)    RDW 17.9 (*)    Platelets 44 (*)    All other components within normal limits  URINALYSIS, ROUTINE W REFLEX MICROSCOPIC (NOT AT [facility]) - Abnormal; Notable for the following:    Color, Urine AMBER (*)    APPearance TURBID (*)    Bilirubin Urine LARGE (*)    Ketones, ur 40 (*)    Nitrite POSITIVE (*)    Leukocytes, UA MODERATE (*)    All other components within normal limits  URINE MICROSCOPIC-ADD ON - Abnormal; Notable for the following:    Squamous Epithelial / LPF 6-30 (*)    Bacteria, UA MANY (*)    Casts HYALINE CASTS (*)    All other components within normal limits  ETHANOL - Abnormal; Notable for the following:    Alcohol, Ethyl (B) 12 (*)    All other components within normal limits  PROTIME-INR - Abnormal; Notable for the following:    Prothrombin Time 20.8 (*)    INR 1.86 (*)    All other components within normal limits  APTT - Abnormal; Notable for the following:    aPTT 49 (*)    All other components within normal limits  URINE CULTURE  LIPASE, BLOOD    Imaging Review Ct Abdomen Pelvis W Contrast  10/27/2015  CLINICAL DATA:  50 year old with low abdominal pain, nausea and vomiting for 3 days. Abdominal distention. History of alcoholic liver disease. EXAM: CT ABDOMEN AND PELVIS WITH CONTRAST TECHNIQUE: Multidetector CT imaging of the abdomen and pelvis was performed using the standard protocol following bolus administration of intravenous contrast. CONTRAST:  ISOVUE-300 IOPAMIDOL (  ISOVUE-300) INJECTION 61% COMPARISON:   None. FINDINGS: Lower chest: There is a dependent small to moderate right-sided pleural effusion. There is trace pleural fluid on the left. There is mild atelectasis dependently at both lung bases. No significant pericardial effusion. Hepatobiliary: The liver is diffusely abnormal with innumerable low-density lesions throughout all segments. The liver is enlarged, measuring 22.2 x 12.0 x 17.9 cm. Its contours are mildly irregular. No dominant hypervascular lesions are seen. The gallbladder is distended with mild wall thickening. There are small calcified gallstones and/or calcifications within the gallbladder wall. No significant biliary dilatation. Pancreas: Unremarkable. No pancreatic ductal dilatation or surrounding inflammatory changes. Spleen: Splenic size is at the upper limits of normal. The spleen measures 13.9 x 12.7 x 7.1 cm for an estimated volume of 627 ml. No focal splenic lesions are identified. Adrenals/Urinary Tract: Both adrenal glands appear normal. The kidneys appear normal without evidence of urinary tract calculus, suspicious lesion or hydronephrosis. No bladder abnormalities are seen. Stomach/Bowel: There is a small hiatal hernia. The stomach and small bowel appear unremarkable. There is mild nonspecific right colonic wall thickening which appears nonfocal. The appendix is not clearly seen, although may extend posteriorly from the cecum into ascites. There is no evidence of pericecal inflammation. Vascular/Lymphatic: There are mildly prominent lymph nodes in the porta hepatis, likely related to chronic liver disease. No retroperitoneal lymphadenopathy. No significant arterial abnormalities identified. The portal, superior mesenteric and splenic veins are patent. There are several retroperitoneal venous collaterals on the left. Reproductive: The uterus appears normal. No evidence of adnexal mass. Other: Moderate ascites, greatest volume in the pelvis. No peritoneal nodularity identified.  Musculoskeletal: No acute or significant osseous findings. Degenerative disc disease noted at L5-S1. IMPRESSION: 1. Distended gallbladder with wall thickening, cholelithiasis and/or gallbladder wall calcification. In light of the patient's liver disease and ascites, these findings are not entirely specific. If cholecystitis is a clinical concern, correlation with ultrasound may be helpful. 2. Chronic liver disease with hepatomegaly, diffuse contour irregularity and innumerable low-density liver lesions, probably representing cirrhosis with regenerating nodules. No focal hypervascular lesions identified on routine imaging. 3. Ascites and retroperitoneal collaterals on the left suspicious for early portal hypertension. Splenic size is at the upper limits of normal. 4. Nonspecific proximal colonic wall thickening, likely related to liver disease and ascites. No evidence of bowel obstruction. 5. Right-greater-than-left pleural effusions with associated bibasilar atelectasis. Electronically Signed   By: Carey BullocksWilliam  Veazey M.D.   On: 10/27/2015 16:58   I have personally reviewed and evaluated these images and lab results as part of my medical decision-making.   EKG Interpretation   Date/Time:  Wednesday October 27 2015 15:46:16 EDT Ventricular Rate:  111 PR Interval:  155 QRS Duration: 77 QT Interval:  366 QTC Calculation: 497 R Axis:   46 Text Interpretation:  Sinus tachycardia Low voltage, precordial leads  Borderline prolonged QT interval Baseline wander in lead(s) II aVR Since  last tracing rate faster Otherwise no significant change Interpretation  limited secondary to artifact Confirmed by KNOTT MD, DANIEL [phone]([id number]) on  10/27/2015 4:03:45 PM      Filed Vitals:   10/27/15 1602 10/27/15 1603 10/27/15 1707 10/27/15 1730  BP:   119/68 122/75  Pulse:   122 123  Temp:      TempSrc:      Resp:   21 20  Height:      Weight:      SpO2: 91% 95% 96% 96%     MDM   Meds given in  ED:  Medications   traMADol (ULTRAM) tablet 50 mg (not administered)  ondansetron (ZOFRAN-ODT) disintegrating tablet 4 mg (4 mg Oral Given 10/27/15 1328)  sodium chloride 0.9 % bolus 500 mL (0 mLs Intravenous Stopped 10/27/15 1829)  thiamine (B-1) injection 100 mg (100 mg Intravenous Given 10/27/15 1624)  iopamidol (ISOVUE-300) 61 % injection 100 mL (100 mLs Intravenous Contrast Given 10/27/15 1633)    New Prescriptions   No medications on file    Final diagnoses:  Acute hepatic failure  Alcoholism (HCC)  Non-intractable vomiting with nausea, vomiting of unspecified type    This  is a 50 y.o. female who presents to the ED Complaining of upper abdominal pain, nausea, vomiting and abdominal distention for almost 1 week now. Patient reports she first noticed her symptoms 6 days ago and remarks that her skin has been turning a different color. She complains of epigastric and RUQ abdominal pain.  Patient reports she has not been eating well lately. She's been stressed about her son. Patient reports she has a history of alcoholism and last drank alcohol 2 months ago. She denies any recent alcohol use. She denies any history of hepatic failure. She does report she's had problems with fatty liver disease in the past. Patient reports her urine has been red in color. She denies any urinary symptoms. On exam the patient is afebrile nontoxic appearing. She is jaundiced. She is not tachycardic with a heart rate of 112. She has hepatomegaly with epigastric and right upper quadrant abdominal tenderness to palpation. Gross ascites. CMP shows a potassium of 2.8 with a total bilirubin of 18.3. Creatinine is less than 0.3. CBC shows no leukocytosis. She has an anemia with a hemoglobin of 8.6. Lipase is within normal limits. Urinalysis is nitrite positive with moderate leukocytes and 6-30 white blood cells. I suspect this is nitrite positive due to her hyperbilirubinemia. She has no urinary symptoms. Urine sent for culture. Alcohol level  is 12. Patient denies any recent alcohol use. INR is elevated at 1.86. CT abdomen and pelvis reveals ascites and retroperitoneal collaterals on the left suspicious for early portal hypertension. Also reveals distended gallbladder with wall thickening. No significant biliary dilation. It shows chronic liver disease with hepatomegaly probably representing cirrhosis. Will likely require ultrasound of gallbladder after admission.  Will admit to medicine. Patient is in agreement with admission.   I consulted with Dr. Mickle Mallory who accepted the patient for admission and requested telemetry temporary admission orders.  The patient was discussed with Dr. Clydene Pugh who agrees with assessment and plan.    Everlene Farrier, PA-C 10/27/15 1929  Lyndal Pulley, MD 10/27/15 2001

## 2015-10-27 NOTE — ED Notes (Addendum)
Pt complains of lower abdominal pain, nausea, vomiting since Sunday. Pt states her abdomen has been distended since last Thursday. Pt states she has not been eating as much lately. Pt last had alcohol in February.

## 2015-10-27 NOTE — Progress Notes (Signed)
Pharmacy Antibiotic Note  Kristina Morris is a 50 y.o. female admitted on 10/27/2015 with UTI  Pharmacy has been consulted for ceftriaxone dosing.  Plan:  Ceftriaxone 1gm IV q24h  No renal adjustment needed, pharmacy will sign off  Please re-consult if further assistance needed  Height: 5\' 6"  (167.6 cm) Weight: 140 lb (63.504 kg) IBW/kg (Calculated) : 59.3  Temp (24hrs), Avg:98.4 F (36.9 C), Min:98.4 F (36.9 C), Max:98.4 F (36.9 C)   Recent Labs Lab 10/27/15 1334  WBC 7.9  CREATININE <0.30*    CrCl cannot be calculated (Patient has no serum creatinine result on file.).    Allergies  Allergen Reactions  . Avelox [Moxifloxacin Hcl In Nacl] Shortness Of Breath, Itching and Rash    GI Upset  . Bee Venom Anaphylaxis  . Doxycycline Nausea Only and Rash  . Amoxicillin Rash    Upset stomach   . Erythromycin Rash    Upset stomach  . Sulfa Antibiotics Rash    Upset stomach    Antimicrobials this admission: 6/14 ceftriaxone >>  Microbiology results:  6/14 UCx: sent   Thank you for allowing pharmacy to be a part of this patient's care.  Arley Phenixllen Tayli Buch RPh 10/27/2015, 8:01 PM Pager [phone]

## 2015-10-27 NOTE — H&P (Signed)
History and Physical  GENEVIA BOULDIN ZOX:[id number] DOB: 02-26-1966 DOA: 10/27/2015  PCP:  Neena Rhymes, MD   Chief Complaint:  Abd distension   History of Present Illness:  Patient is a 50 year old female with history of alcoholism came with cc of increasing abdominal distension for the past week associated with periumbilical abdominal pain that is aching and constant with occasional non bloody vomiting and nausea. She also has had jaundice for two weeks. She has chills but no fevers. No dysuria. No chest pain or cough. She has dyspnea due to shallow breaths when laying down. She has poor appetite and lost a few pounds over the last couple of months. She said this year she drank only the last week in Jan and the last week of March, once each time. She used to drink for 18 years, about a pint of vodka daily. She does not follow up with a gastroenterologist.   Review of Systems:  CONSTITUTIONAL:     No night sweats.  +fatigue.  No fever. +chills. Eyes:                            No visual changes.  No eye pain.  No eye discharge.   ENT:                              No epistaxis.  No sinus pain.  No sore throat.   No congestion. RESPIRATORY:           No cough.  No wheeze.  No hemoptysis.  +dyspnea CARDIOVASCULAR   :  No chest pains.  No palpitations. GASTROINTESTINAL:  +abdominal pain.  +nausea. +vomiting.  No diarrhea. No constipation.  No hematemesis.  No hematochezia.  No melena. GENITOURINARY:      No urgency.  No frequency.  No dysuria.  No hematuria.  No  obstructive symptoms.  No discharge.  No pain.   MUSCULOSKELETAL:  No musculoskeletal pain.  No joint swelling.  No arthritis. NEUROLOGICAL:        No confusion.  No weakness. No headache. No seizure. PSYCHIATRIC:             No depression. No anxiety. No suicidal ideation. SKIN:                             No rashes.  No lesions.  No wounds. ENDOCRINE:                +weight loss.  No polydipsia.  No polyuria.  No  polyphagia. HEMATOLOGIC:           No purpura.  No petechiae.  No bleeding.  ALLERGIC                 : No pruritus.  No angioedema Other:  Past Medical and Surgical History:   Past Medical History  Diagnosis Date  . Mitral valve prolapse   . Seasonal allergies   . Chicken pox   . Depression   . Alcoholic (HCC)   . Fatty liver   . Hyperlipidemia    Past Surgical History  Procedure Laterality Date  . Ovarian cyst removal    . 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 Cysts removal, sinus    . Tonsillectomy and adenoidectomy      Social History:   reports that she  has been smoking Cigarettes.  She has never used smokeless tobacco. She reports that she drinks alcohol. She reports that she does not use illicit drugs.   Allergies  Allergen Reactions  . Avelox [Moxifloxacin Hcl In Nacl] Shortness Of Breath, Itching and Rash    GI Upset  . Bee Venom Anaphylaxis  . Doxycycline Nausea Only and Rash  . Amoxicillin Rash    Upset stomach   . Erythromycin Rash    Upset stomach  . Sulfa Antibiotics Rash    Upset stomach    Family History  Problem Relation Age of Onset  . Depression Mother   . Liver disease Brother     older brother  . Liver disease Maternal Aunt   . Liver disease Paternal Aunt   . Alcoholism      both sides of family  . Diabetes Brother   . Hypertension Brother     older brother  . Breast cancer Mother   . Osteoporosis Mother   . Cancer      both sides of family  . Heart disease Father   . Heart disease Maternal Grandmother       Prior to Admission medications   Medication Sig Start Date End Date Taking? Authorizing Provider  b complex vitamins tablet Take 1 tablet by mouth 2 (two) times a week.    Yes Historical Provider, MD  BIOTIN PO Take 10,000 mcg by mouth daily.   Yes Historical Provider, MD  calcium carbonate (OS-CAL) 600 MG TABS tablet Take 600 mg by mouth daily. + Vitamin D   Yes Historical Provider, MD  cyclobenzaprine (FLEXERIL) 5 MG tablet Take 1 tablet  (5 mg total) by mouth at bedtime. Patient taking differently: Take 5 mg by mouth 3 (three) times daily as needed for muscle spasms.  12/24/14  Yes Van Clines, MD  fluticasone [facility]) 50 MCG/ACT nasal spray Place 2 sprays into both nostrils daily as needed for allergies.    Yes Historical Provider, MD  folic acid (FOLVITE) 800 MCG tablet Take 800 mcg by mouth daily.   Yes Historical Provider, MD  GILDESS FE 1/20 1-20 MG-MCG tablet Take 1 tablet by mouth daily. 08/20/13  Yes Historical Provider, MD  hydrOXYzine (VISTARIL) 25 MG capsule 1 tab Q4-6 prn and nightly for sleep Patient taking differently: Take 25 mg by mouth 2 (two) times daily as needed for anxiety (takes in daytime as needed and takes every night for sleep).  09/28/14  Yes Sheliah Hatch, MD  ibuprofen (ADVIL,MOTRIN) 200 MG tablet Take 200 mg by mouth every 6 (six) hours as needed for moderate pain.   Yes Historical Provider, MD  Multiple Vitamin (DAILY MULTIVITAMIN PO) Take 1 tablet by mouth daily.   Yes Historical Provider, MD  potassium chloride SA (K-DUR,KLOR-CON) 20 MEQ tablet Take 1 tablet (20 mEq total) by mouth daily. Patient taking differently: Take 20 mEq by mouth 2 (two) times daily.  12/02/14  Yes Sheliah Hatch, MD  sertraline (ZOLOFT) 100 MG tablet Take 1 tablet (100 mg total) by mouth every evening. 02/01/15  Yes Sheliah Hatch, MD  traMADol (ULTRAM) 50 MG tablet take 1 tablet by mouth every 8 hours if needed Patient taking differently: take 1 tablet by mouth every 8 hours if needed for pain 03/29/15  Yes Sheliah Hatch, MD  vitamin E 400 UNIT capsule Take 400 Units by mouth daily.   Yes Historical Provider, MD  cefUROXime (CEFTIN) 250 MG tablet Take 1 tablet (250 mg  total) by mouth 2 (two) times daily with a meal. Patient not taking: Reported on 10/27/2015 04/30/15   Sheliah Hatch, MD  EPINEPHrine 0.3 mg/0.3 mL IJ SOAJ injection Inject 0.3 mg into the muscle as needed.    Historical Provider, MD    fexofenadine (ALLEGRA) 180 MG tablet Take 180 mg by mouth as needed for allergies. Reported on 10/27/2015    Historical Provider, MD  promethazine-dextromethorphan (PROMETHAZINE-DM) 6.25-15 MG/5ML syrup Take 5 mLs by mouth 4 (four) times daily as needed. Patient not taking: Reported on 10/27/2015 04/30/15   Sheliah Hatch, MD    Physical Exam: BP 122/75 mmHg  Pulse 123  Temp(Src) 98.4 F (36.9 C) (Oral)  Resp 20  Ht 5\' 6"  (1.676 m)  Wt 63.504 kg (140 lb)  BMI 22.61 kg/m2  SpO2 96%  GENERAL :   Alert and cooperative, and appears to be in no acute distress. HEAD:           normocephalic. EYES:            Icteric  EARS:           hearing grossly intact. NOSE:           No nasal discharge. THROAT:     Oral cavity and pharynx normal.   NECK:          supple CARDIAC:    Normal S1 and S2. No gallop. No murmurs.  Vascular:     no peripheral edema.  LUNGS:       Clear to auscultation  ABDOMEN: Positive bowel sounds. Soft, distended, tender in epigastric and RUQ area. No guarding or rebound.      MSK:           No joint erythema or tenderness.  EXT           : No significant deformity or joint abnormality. Neuro        : Alert, oriented to person, place, and time.                      CN II-XII intact.                       Strength and sensation symmetric and intact throughout.                        SKIN:            Jaundice. Capillary dilation in upper chest. PSYCH:       No hallucination. Patient is not suicidal.          Labs on Admission:  Reviewed.   Radiological Exams on Admission: Ct Abdomen Pelvis W Contrast  10/27/2015  CLINICAL DATA:  50 year old with low abdominal pain, nausea and vomiting for 3 days. Abdominal distention. History of alcoholic liver disease. EXAM: CT ABDOMEN AND PELVIS WITH CONTRAST TECHNIQUE: Multidetector CT imaging of the abdomen and pelvis was performed using the standard protocol following bolus administration of intravenous contrast. CONTRAST:   ISOVUE-300 IOPAMIDOL (ISOVUE-300) INJECTION 61% COMPARISON:  None. FINDINGS: Lower chest: There is a dependent small to moderate right-sided pleural effusion. There is trace pleural fluid on the left. There is mild atelectasis dependently at both lung bases. No significant pericardial effusion. Hepatobiliary: The liver is diffusely abnormal with innumerable low-density lesions throughout all segments. The liver is enlarged, measuring 22.2 x 12.0 x 17.9 cm. Its contours are mildly irregular. No dominant  hypervascular lesions are seen. The gallbladder is distended with mild wall thickening. There are small calcified gallstones and/or calcifications within the gallbladder wall. No significant biliary dilatation. Pancreas: Unremarkable. No pancreatic ductal dilatation or surrounding inflammatory changes. Spleen: Splenic size is at the upper limits of normal. The spleen measures 13.9 x 12.7 x 7.1 cm for an estimated volume of 627 ml. No focal splenic lesions are identified. Adrenals/Urinary Tract: Both adrenal glands appear normal. The kidneys appear normal without evidence of urinary tract calculus, suspicious lesion or hydronephrosis. No bladder abnormalities are seen. Stomach/Bowel: There is a small hiatal hernia. The stomach and small bowel appear unremarkable. There is mild nonspecific right colonic wall thickening which appears nonfocal. The appendix is not clearly seen, although may extend posteriorly from the cecum into ascites. There is no evidence of pericecal inflammation. Vascular/Lymphatic: There are mildly prominent lymph nodes in the porta hepatis, likely related to chronic liver disease. No retroperitoneal lymphadenopathy. No significant arterial abnormalities identified. The portal, superior mesenteric and splenic veins are patent. There are several retroperitoneal venous collaterals on the left. Reproductive: The uterus appears normal. No evidence of adnexal mass. Other: Moderate ascites, greatest  volume in the pelvis. No peritoneal nodularity identified. Musculoskeletal: No acute or significant osseous findings. Degenerative disc disease noted at L5-S1. IMPRESSION: 1. Distended gallbladder with wall thickening, cholelithiasis and/or gallbladder wall calcification. In light of the patient's liver disease and ascites, these findings are not entirely specific. If cholecystitis is a clinical concern, correlation with ultrasound may be helpful. 2. Chronic liver disease with hepatomegaly, diffuse contour irregularity and innumerable low-density liver lesions, probably representing cirrhosis with regenerating nodules. No focal hypervascular lesions identified on routine imaging. 3. Ascites and retroperitoneal collaterals on the left suspicious for early portal hypertension. Splenic size is at the upper limits of normal. 4. Nonspecific proximal colonic wall thickening, likely related to liver disease and ascites. No evidence of bowel obstruction. 5. Right-greater-than-left pleural effusions with associated bibasilar atelectasis. Electronically Signed   By: Carey BullocksWilliam  Veazey M.D.   On: 10/27/2015 16:58    EKG:  Independently reviewed. Sinus tachy.  Assessment/Plan  Chronic liver disease / cirrhosis:  MELD score 25. Patient has higher risk of morbidity/mortality  Current complications of cirrhosis: ascites : will put a consult to IR for paracentesis with labs sent from fluid obtained : diagnostic and therapeutic paracentesis. No bleeding/variceal bleeding/HRS/encephalopathy.  Consult to gastroenterology Likely due to alcoholism : patient counseled   Thrombocytopenia: likely due to above.   Hypokalemia: repalce with 40 meq, will trend  Tachycardia: sinus likely due to above, given 500 cc in the ER.  abd pain: no evidence of hepatitis/pancreatitis. Likely due to gallstones. No evidence of cholecystitis per exam. Will continue tramadol and zofran as needed.   NPO after MN for possible MRI/MRCP  UA  suggestive of an infection: will start on ceftriaxone. No evidence of SBP.     Input & Output: ordered Lines & Tubes: PIV DVT prophylaxis:  SCDs GI prophylaxis:  PPI Consultants: GI Code Status: Full  Family Communication: none at bedside  Disposition Plan: admit to tele/ stepdown if hypotensive/hyoxic.     Eston EstersAhmad Ysela Hettinger M.D [facility]

## 2015-10-28 ENCOUNTER — Inpatient Hospital Stay (HOSPITAL_COMMUNITY): Payer: 59

## 2015-10-28 DIAGNOSIS — K7040 Alcoholic hepatic failure without coma: Principal | ICD-10-CM

## 2015-10-28 LAB — COMPREHENSIVE METABOLIC PANEL
ALT: 31 U/L (ref 14–54)
AST: 179 U/L — ABNORMAL HIGH (ref 15–41)
Albumin: 2.1 g/dL — ABNORMAL LOW (ref 3.5–5.0)
Alkaline Phosphatase: 94 U/L (ref 38–126)
Anion gap: 7 (ref 5–15)
BUN: 8 mg/dL (ref 6–20)
CHLORIDE: 97 mmol/L — AB (ref 101–111)
CO2: 31 mmol/L (ref 22–32)
CREATININE: 0.33 mg/dL — AB (ref 0.44–1.00)
Calcium: 8 mg/dL — ABNORMAL LOW (ref 8.9–10.3)
Glucose, Bld: 82 mg/dL (ref 65–99)
POTASSIUM: 3 mmol/L — AB (ref 3.5–5.1)
Sodium: 135 mmol/L (ref 135–145)
Total Bilirubin: 17.8 mg/dL — ABNORMAL HIGH (ref 0.3–1.2)
Total Protein: 6.7 g/dL (ref 6.5–8.1)

## 2015-10-28 LAB — FOLATE: FOLATE: 8.2 ng/mL (ref >5.9)

## 2015-10-28 LAB — PROTIME-INR
INR: 2.13 — ABNORMAL HIGH (ref 0.00–1.49)
Prothrombin Time: 23 seconds — ABNORMAL HIGH (ref 11.6–15.2)

## 2015-10-28 LAB — PROTEIN, BODY FLUID

## 2015-10-28 LAB — BODY FLUID CELL COUNT WITH DIFFERENTIAL
LYMPHS FL: 11 %
MONOCYTE-MACROPHAGE-SEROUS FLUID: 89 % (ref 50–90)
Total Nucleated Cell Count, Fluid: 16 cu mm (ref 0–1000)

## 2015-10-28 LAB — URINE CULTURE

## 2015-10-28 LAB — IRON AND TIBC: Iron: 80 ug/dL (ref 28–170)

## 2015-10-28 LAB — VITAMIN B12: VITAMIN B 12: 1767 pg/mL — AB (ref 180–914)

## 2015-10-28 LAB — RETICULOCYTES
RBC.: 1.83 MIL/uL — AB (ref 3.87–5.11)
Retic Count, Absolute: 89.7 10*3/uL (ref 19.0–186.0)
Retic Ct Pct: 4.9 % — ABNORMAL HIGH (ref 0.4–3.1)

## 2015-10-28 LAB — GRAM STAIN

## 2015-10-28 LAB — ALBUMIN, FLUID (OTHER): Albumin, Fluid: <1 g/dL

## 2015-10-28 LAB — CBC
HCT: 21.8 % — ABNORMAL LOW (ref 36.0–46.0)
Hemoglobin: 7.6 g/dL — ABNORMAL LOW (ref 12.0–15.0)
MCH: 39.6 pg — AB (ref 26.0–34.0)
MCHC: 34.9 g/dL (ref 30.0–36.0)
MCV: 113.5 fL — AB (ref 78.0–100.0)
PLATELETS: 43 10*3/uL — AB (ref 150–400)
RBC: 1.92 MIL/uL — ABNORMAL LOW (ref 3.87–5.11)
RDW: 18.2 % — AB (ref 11.5–15.5)
WBC: 6.9 10*3/uL (ref 4.0–10.5)

## 2015-10-28 LAB — APTT: APTT: 50 s — AB (ref 24–37)

## 2015-10-28 LAB — FERRITIN: Ferritin: 1234 ng/mL — ABNORMAL HIGH (ref 11–307)

## 2015-10-28 LAB — PATHOLOGIST SMEAR REVIEW

## 2015-10-28 MED ORDER — MORPHINE SULFATE (PF) 2 MG/ML IV SOLN
2.0000 mg | Freq: Once | INTRAVENOUS | Status: AC
  Administered 2015-10-28: 2 mg via INTRAVENOUS
  Filled 2015-10-28: qty 1

## 2015-10-28 MED ORDER — FUROSEMIDE 10 MG/ML IJ SOLN
20.0000 mg | Freq: Three times a day (TID) | INTRAMUSCULAR | Status: DC
  Administered 2015-10-28 – 2015-10-30 (×7): 20 mg via INTRAVENOUS
  Filled 2015-10-28 (×10): qty 2

## 2015-10-28 MED ORDER — MORPHINE SULFATE (PF) 2 MG/ML IV SOLN
2.0000 mg | INTRAVENOUS | Status: AC
  Administered 2015-10-28: 2 mg via INTRAVENOUS
  Filled 2015-10-28: qty 1

## 2015-10-28 NOTE — Consult Note (Signed)
[facility] CONSULT Reason for consult: jaundice and progressive liver failure Referring Physician: [facility] . PCP: Dr. Mable Paris. Primary G.I.: [place]. [name] is an 50 y.o. female.  HPI: she has a long history of alcoholism in his seen several people over the years. Several years ago she was evaluated to [facility] at endoscopy. Apparently did not show varices and was encouraged to quit drinking. She had ultrasounds CT's lab tests etc. the results which we do not have. She states that she has been away from alcohol now for 4 months with no alcohol ingestion all that she previously drank more vodka or day for 18 years. She was jaundice about a year and a half ago briefly while she was seen at [facility]. She states that she got better and did not go back for while very he began to have some increasing symptoms of weakness and felt that her liver may be causing trouble and has an appointment July 9 with gastroenterologist to [facility]. For one week she began having upper abdominal discomfort abdominal swelling and swelling in her lower extremities which was negative. Again she denies any alcohol ingestion for 4 months. She was admitted after evaluation in the emergency room revealed jaundice with total bilirubin 17.8 albumin 2.1 slight elevation of AST and PT 23. Patients platelet count is 43. CT shown diffusely enlarged liver with multiple density lesions. Gallbladder had some wall thickening with some small gallstones. She had known gallstones when seen [facility] a year or 2 ago as well. There was no biliary ductal dilatation. This plane was in large. Patient was clearly felt to have portal hypertension and cirrhosis. She had ascites and ultrasound guided paracentesis was performed today. Ultrasound did show a large amount of the societies in the paracentesis for  diagnostic purposes was performed 700 mL fluid removed earlier this morning. Results still pending. Patients iron studies revealed a normal iron level.ferritin markedly elevated 1234. alpha-fetoprotein is pending. It is notable that the patient alcohol level was 12 mg/dL in spite of her claim that she has had no alcohol for 4 months. This was just yesterday.  Past Medical History  Diagnosis Date  . Mitral valve prolapse   . Seasonal allergies   . Chicken pox   . Depression   . Alcoholic ([place])   . Fatty liver   . Hyperlipidemia     Past Surgical History  Procedure Laterality Date  . Ovarian cyst removal    . 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 Cysts removal, sinus    . Tonsillectomy and adenoidectomy      Family History  Problem Relation Age of Onset  . Depression Mother   . Liver disease Brother     older brother  . Liver disease Maternal Aunt   . Liver disease Paternal Aunt   . Alcoholism      both sides of family  . Diabetes Brother   . Hypertension Brother     older brother  . Breast cancer Mother   . Osteoporosis Mother   . Cancer      both sides of family  . Heart disease Father   . Heart disease Maternal Grandmother     Social History:  reports that she has been smoking Cigarettes.  She has never used smokeless tobacco. She reports that she drinks alcohol. She reports that she does not use illicit drugs.  Allergies:  Allergies  Allergen Reactions  . Avelox [Moxifloxacin Hcl In Nacl] Shortness Of Breath, Itching and Rash    GI Upset  . Bee Venom Anaphylaxis  . Doxycycline Nausea Only and Rash  . Amoxicillin Rash    Upset stomach   . Erythromycin Rash    Upset stomach  . Sulfa Antibiotics Rash    Upset stomach    Medications; Prior to Admission medications   Medication Sig Start Date End Date Taking? Authorizing Provider  b complex vitamins tablet Take 1 tablet by mouth 2 (two) times a week.    Yes Historical Provider, MD  BIOTIN PO Take 10,000 mcg by mouth daily.    Yes Historical Provider, MD  calcium carbonate (OS-CAL) 600 MG TABS tablet Take 600 mg by mouth daily. + Vitamin D   Yes Historical Provider, MD  cyclobenzaprine (FLEXERIL) 5 MG tablet Take 1 tablet (5 mg total) by mouth at bedtime. Patient taking differently: Take 5 mg by mouth 3 (three) times daily as needed for muscle spasms.  12/24/14  Yes Cameron Sprang, MD  fluticasone [facility]) 50 MCG/ACT nasal spray Place 2 sprays into both nostrils daily as needed for allergies.    Yes Historical Provider, MD  folic acid (FOLVITE) 759 MCG tablet Take 800 mcg by mouth daily.   Yes Historical Provider, MD  GILDESS FE 1/20 1-20 MG-MCG tablet Take 1 tablet by mouth daily. 08/20/13  Yes Historical Provider, MD  hydrOXYzine (VISTARIL) 25 MG capsule 1 tab Q4-6 prn and nightly for sleep Patient taking differently: Take 25 mg by mouth 2 (two) times daily as needed for anxiety (takes in daytime as needed and takes every night for sleep).  09/28/14  Yes Midge Minium, MD  ibuprofen (ADVIL,MOTRIN) 200 MG tablet Take 200 mg by mouth every 6 (six) hours as needed for moderate pain.   Yes Historical Provider, MD  Multiple Vitamin (DAILY MULTIVITAMIN PO) Take 1 tablet by mouth daily.   Yes Historical Provider, MD  potassium chloride SA (K-DUR,KLOR-CON) 20 MEQ tablet Take 1 tablet (20 mEq total) by mouth daily. Patient taking differently: Take 20 mEq by mouth 2 (two) times daily.  12/02/14  Yes Midge Minium, MD  sertraline (ZOLOFT) 100 MG tablet Take 1 tablet (100 mg total) by mouth every evening. 02/01/15  Yes Midge Minium, MD  traMADol (ULTRAM) 50 MG tablet take 1 tablet by mouth every 8 hours if needed Patient taking differently: take 1 tablet by mouth every 8 hours if needed for pain 03/29/15  Yes Midge Minium, MD  vitamin E 400 UNIT capsule Take 400 Units by mouth daily.   Yes Historical Provider, MD  cefUROXime (CEFTIN) 250 MG tablet Take 1 tablet (250 mg total) by mouth 2 (two) times daily with a  meal. Patient not taking: Reported on 10/27/2015 04/30/15   Midge Minium, MD  EPINEPHrine 0.3 mg/0.3 mL IJ SOAJ injection Inject 0.3 mg into the muscle as needed.    Historical Provider, MD  fexofenadine (ALLEGRA) 180 MG tablet Take 180 mg by mouth as needed for allergies. Reported on 10/27/2015    Historical Provider, MD  promethazine-dextromethorphan (PROMETHAZINE-DM) 6.25-15 MG/5ML syrup Take 5 mLs by mouth 4 (four) times daily as needed. Patient not taking: Reported on 10/27/2015 04/30/15   Midge Minium, MD   . cefTRIAXone (ROCEPHIN)  IV  1 g Intravenous Q24H  . folic acid  1 mg Oral Daily  . furosemide  20 mg Intravenous Q8H  .  morphine injection  2 mg Intravenous NOW  . pantoprazole  40 mg Oral Daily  . sodium chloride flush  3 mL Intravenous Q12H  . thiamine  100 mg Oral Daily   PRN Meds cyclobenzaprine, traMADol Results for orders placed or performed during the hospital encounter of 10/27/15 (from the past 48 hour(s))  Lipase, blood     Status: None   Collection Time: 10/27/15  1:34 PM  Result Value Ref Range   Lipase 51 11 - 51 U/Morris  Comprehensive metabolic panel     Status: Abnormal   Collection Time: 10/27/15  1:34 PM  Result Value Ref Range   Sodium 136 135 - 145 mmol/Morris   Potassium 2.8 (Morris) 3.5 - 5.1 mmol/Morris   Chloride 95 (Morris) 101 - 111 mmol/Morris   CO2 27 22 - 32 mmol/Morris   Glucose, Bld 113 (H) 65 - 99 mg/dL   BUN 8 6 - 20 mg/dL   Creatinine, Ser <0.30 (Morris) 0.44 - 1.00 mg/dL   Calcium 8.4 (Morris) 8.9 - 10.3 mg/dL   Total Protein 7.6 6.5 - 8.1 g/dL   Albumin 2.4 (Morris) 3.5 - 5.0 g/dL   AST 220 (H) 15 - 41 U/Morris   ALT 37 14 - 54 U/Morris   Alkaline Phosphatase 112 38 - 126 U/Morris   Total Bilirubin 18.3 (HH) 0.3 - 1.2 mg/dL    Comment: CRITICAL RESULT CALLED TO, READ BACK BY AND VERIFIED WITH: WATERS,Q. RN _0  ON 6.14.17 BY MCCOY,N.    GFR calc non Af Amer NOT CALCULATED >60 mL/min   GFR calc Af Amer NOT CALCULATED >60 mL/min    Comment: (NOTE) The eGFR has been calculated  using the CKD EPI equation. This calculation has not been validated in all clinical situations. eGFR's persistently <60 mL/min signify possible Chronic Kidney Disease.    Anion gap 14 5 - 15  CBC     Status: Abnormal   Collection Time: 10/27/15  1:34 PM  Result Value Ref Range   WBC 7.9 4.0 - 10.5 K/uL   RBC 2.19 (Morris) 3.87 - 5.11 MIL/uL   Hemoglobin 8.6 (Morris) 12.0 - 15.0 g/dL   HCT 24.5 (Morris) 36.0 - 46.0 %   MCV 111.9 (H) 78.0 - 100.0 fL   MCH 39.3 (H) 26.0 - 34.0 pg   MCHC 35.1 30.0 - 36.0 g/dL   RDW 17.9 (H) 11.5 - 15.5 %   Platelets 44 (Morris) 150 - 400 K/uL    Comment: SPECIMEN CHECKED FOR CLOTS REPEATED TO VERIFY PLATELET COUNT CONFIRMED BY SMEAR   Urinalysis, Routine w reflex microscopic     Status: Abnormal   Collection Time: 10/27/15  2:21 PM  Result Value Ref Range   Color, Urine AMBER (A) YELLOW    Comment: BIOCHEMICALS MAY BE AFFECTED BY COLOR   APPearance TURBID (A) CLEAR   Specific Gravity, Urine 1.024 1.005 - 1.030   pH 5.5 5.0 - 8.0   Glucose, UA NEGATIVE NEGATIVE mg/dL   Hgb urine dipstick NEGATIVE NEGATIVE   Bilirubin Urine LARGE (A) NEGATIVE   Ketones, ur 40 (A) NEGATIVE mg/dL   Protein, ur NEGATIVE NEGATIVE mg/dL   Nitrite POSITIVE (A) NEGATIVE   Leukocytes, UA MODERATE (A) NEGATIVE  Urine microscopic-add on     Status: Abnormal   Collection Time: 10/27/15  2:21 PM  Result Value Ref Range   Squamous Epithelial / LPF 6-30 (A) NONE SEEN   WBC, UA 6-30 0 - 5 WBC/hpf   RBC / HPF 0-5 0 -  5 RBC/hpf   Bacteria, UA MANY (A) NONE SEEN   Casts HYALINE CASTS (A) NEGATIVE   Urine-Other MUCOUS PRESENT   Urine culture     Status: Abnormal   Collection Time: 10/27/15  2:21 PM  Result Value Ref Range   Specimen Description URINE, RANDOM    Special Requests NONE    Culture (A)     3,000 COLONIES/mL INSIGNIFICANT GROWTH Performed at [facility]    Report Status 10/28/2015 FINAL   Ethanol     Status: Abnormal   Collection Time: 10/27/15  3:42 PM  Result Value  Ref Range   Alcohol, Ethyl (B) 12 (H) <5 mg/dL    Comment:        LOWEST DETECTABLE LIMIT FOR SERUM ALCOHOL IS 5 mg/dL FOR MEDICAL PURPOSES ONLY   Protime-INR     Status: Abnormal   Collection Time: 10/27/15  4:05 PM  Result Value Ref Range   Prothrombin Time 20.8 (H) 11.6 - 15.2 seconds   INR 1.86 (H) 0.00 - 1.49  APTT     Status: Abnormal   Collection Time: 10/27/15  4:05 PM  Result Value Ref Range   aPTT 49 (H) 24 - 37 seconds    Comment:        IF BASELINE aPTT IS ELEVATED, SUGGEST PATIENT RISK ASSESSMENT BE USED TO DETERMINE APPROPRIATE ANTICOAGULANT THERAPY.   APTT     Status: Abnormal   Collection Time: 10/28/15  5:19 AM  Result Value Ref Range   aPTT 50 (H) 24 - 37 seconds    Comment:        IF BASELINE aPTT IS ELEVATED, SUGGEST PATIENT RISK ASSESSMENT BE USED TO DETERMINE APPROPRIATE ANTICOAGULANT THERAPY.   Protime-INR     Status: Abnormal   Collection Time: 10/28/15  5:19 AM  Result Value Ref Range   Prothrombin Time 23.0 (H) 11.6 - 15.2 seconds   INR 2.13 (H) 0.00 - 1.49  CBC     Status: Abnormal   Collection Time: 10/28/15  5:19 AM  Result Value Ref Range   WBC 6.9 4.0 - 10.5 K/uL   RBC 1.92 (Morris) 3.87 - 5.11 MIL/uL   Hemoglobin 7.6 (Morris) 12.0 - 15.0 g/dL   HCT 21.8 (Morris) 36.0 - 46.0 %   MCV 113.5 (H) 78.0 - 100.0 fL   MCH 39.6 (H) 26.0 - 34.0 pg   MCHC 34.9 30.0 - 36.0 g/dL   RDW 18.2 (H) 11.5 - 15.5 %   Platelets 43 (Morris) 150 - 400 K/uL    Comment: CONSISTENT WITH PREVIOUS RESULT  Comprehensive metabolic panel     Status: Abnormal   Collection Time: 10/28/15  5:19 AM  Result Value Ref Range   Sodium 135 135 - 145 mmol/Morris   Potassium 3.0 (Morris) 3.5 - 5.1 mmol/Morris   Chloride 97 (Morris) 101 - 111 mmol/Morris   CO2 31 22 - 32 mmol/Morris   Glucose, Bld 82 65 - 99 mg/dL   BUN 8 6 - 20 mg/dL   Creatinine, Ser 0.33 (Morris) 0.44 - 1.00 mg/dL   Calcium 8.0 (Morris) 8.9 - 10.3 mg/dL   Total Protein 6.7 6.5 - 8.1 g/dL   Albumin 2.1 (Morris) 3.5 - 5.0 g/dL   AST 179 (H) 15 - 41 U/Morris   ALT 31  14 - 54 U/Morris   Alkaline Phosphatase 94 38 - 126 U/Morris   Total Bilirubin 17.8 (H) 0.3 - 1.2 mg/dL   GFR calc non Af Amer >60 >60 mL/min  GFR calc Af Amer >60 >60 mL/min    Comment: (NOTE) The eGFR has been calculated using the CKD EPI equation. This calculation has not been validated in all clinical situations. eGFR's persistently <60 mL/min signify possible Chronic Kidney Disease.    Anion gap 7 5 - 15  Vitamin B12     Status: Abnormal   Collection Time: 10/28/15  9:43 AM  Result Value Ref Range   Vitamin B-12 1767 (H) 180 - 914 pg/mL    Comment: Performed at [facility]  Folate     Status: None   Collection Time: 10/28/15  9:43 AM  Result Value Ref Range   Folate 8.2 >5.9 ng/mL    Comment: Performed at [facility]  Iron and TIBC     Status: None   Collection Time: 10/28/15  9:43 AM  Result Value Ref Range   Iron 80 28 - 170 ug/dL   TIBC NOT CALCULATED 250 - 450 ug/dL    Comment: TRANSFERRIN <70   Saturation Ratios NOT CALCULATED 10.4 - 31.8 %    Comment: TRANSFERRIN <70   UIBC NOT CALCULATED ug/dL    Comment: TRANSFERRIN <70 Performed at [facility]   Ferritin     Status: Abnormal   Collection Time: 10/28/15  9:43 AM  Result Value Ref Range   Ferritin 1234 (H) 11 - 307 ng/mL    Comment: Performed at [facility]  Reticulocytes     Status: Abnormal   Collection Time: 10/28/15  9:43 AM  Result Value Ref Range   Retic Ct Pct 4.9 (H) 0.4 - 3.1 %   RBC. 1.83 (Morris) 3.87 - 5.11 MIL/uL   Retic Count, Manual 89.7 19.0 - 186.0 K/uL  Albumin, pleural or peritoneal fluid     Status: None   Collection Time: 10/28/15 11:25 AM  Result Value Ref Range   Albumin, Fluid <1.0 g/dL    Comment: REPEATED TO VERIFY (NOTE) No normal range established for this test Results should be evaluated in conjunction with serum values Performed at [facility]: CORRECTED ON 06/15 AT 1202: PREVIOUSLY REPORTED AS ASCITIES   Protein, pleural or peritoneal fluid     Status: None   Collection Time: 10/28/15 11:25 AM  Result Value Ref Range   Total protein, fluid <3.0 g/dL    Comment: REPEATED TO VERIFY (NOTE) No normal range established for this test Results should be evaluated in conjunction with serum values Performed at [facility]    Fluid Type-FTP ASCITIC     Comment: CORRECTED ON 06/15 AT 1203: PREVIOUSLY REPORTED AS ASCITIES  Body fluid cell count with differential     Status: Abnormal   Collection Time: 10/28/15 11:25 AM  Result Value Ref Range   Fluid Type-FCT ASCITIC     Comment: CORRECTED ON 06/15 AT 1203: PREVIOUSLY REPORTED AS ASCITIES   Color, Fluid YELLOW YELLOW   Appearance, Fluid CLEAR (A) CLEAR   WBC, Fluid 16 0 - 1000 cu mm   Lymphs, Fluid 11 %   Monocyte-Macrophage-Serous Fluid 89 50 - 90 %  Pathologist smear review     Status: None   Collection Time: 10/28/15 11:25 AM  Result Value Ref Range   Path Review Reviewed by Kalman Drape, M.D.     Comment: 06.15.17 REACTIVE MESOTHELIAL CELLS     Ct Abdomen Pelvis W Contrast  10/27/2015  CLINICAL DATA:  50 year old with low abdominal pain,  nausea and vomiting for 3 days. Abdominal distention. History of alcoholic liver disease. EXAM: CT ABDOMEN AND PELVIS WITH CONTRAST TECHNIQUE: Multidetector CT imaging of the abdomen and pelvis was performed using the standard protocol following bolus administration of intravenous contrast. CONTRAST:  166m ISOVUE-300 IOPAMIDOL (ISOVUE-300) INJECTION 61% COMPARISON:  None. FINDINGS: Lower chest: There is a dependent small to moderate right-sided pleural effusion. There is trace pleural fluid on the left. There is mild atelectasis dependently at both lung bases. No significant pericardial effusion. Hepatobiliary: The liver is diffusely abnormal with innumerable low-density lesions throughout all segments. The liver is enlarged, measuring 22.2 x 12.0 x 17.9 cm. Its contours are mildly irregular. No  dominant hypervascular lesions are seen. The gallbladder is distended with mild wall thickening. There are small calcified gallstones and/or calcifications within the gallbladder wall. No significant biliary dilatation. Pancreas: Unremarkable. No pancreatic ductal dilatation or surrounding inflammatory changes. Spleen: Splenic size is at the upper limits of normal. The spleen measures 13.9 x 12.7 x 7.1 cm for an estimated volume of 627 ml. No focal splenic lesions are identified. Adrenals/Urinary Tract: Both adrenal glands appear normal. The kidneys appear normal without evidence of urinary tract calculus, suspicious lesion or hydronephrosis. No bladder abnormalities are seen. Stomach/Bowel: There is a small hiatal hernia. The stomach and small bowel appear unremarkable. There is mild nonspecific right colonic wall thickening which appears nonfocal. The appendix is not clearly seen, although may extend posteriorly from the cecum into ascites. There is no evidence of pericecal inflammation. Vascular/Lymphatic: There are mildly prominent lymph nodes in the porta hepatis, likely related to chronic liver disease. No retroperitoneal lymphadenopathy. No significant arterial abnormalities identified. The portal, superior mesenteric and splenic veins are patent. There are several retroperitoneal venous collaterals on the left. Reproductive: The uterus appears normal. No evidence of adnexal mass. Other: Moderate ascites, greatest volume in the pelvis. No peritoneal nodularity identified. Musculoskeletal: No acute or significant osseous findings. Degenerative disc disease noted at L5-S1. IMPRESSION: 1. Distended gallbladder with wall thickening, cholelithiasis and/or gallbladder wall calcification. In light of the patient's liver disease and ascites, these findings are not entirely specific. If cholecystitis is a clinical concern, correlation with ultrasound may be helpful. 2. Chronic liver disease with hepatomegaly, diffuse  contour irregularity and innumerable low-density liver lesions, probably representing cirrhosis with regenerating nodules. No focal hypervascular lesions identified on routine imaging. 3. Ascites and retroperitoneal collaterals on the left suspicious for early portal hypertension. Splenic size is at the upper limits of normal. 4. Nonspecific proximal colonic wall thickening, likely related to liver disease and ascites. No evidence of bowel obstruction. 5. Right-greater-than-left pleural effusions with associated bibasilar atelectasis. Electronically Signed   By: WRichardean SaleM.D.   On: 10/27/2015 16:58   [place]  10/28/2015  INDICATION: Abdominal distention secondary to ascites. Cirrhosis. Request diagnostic and therapeutic paracentesis. EXAM: ULTRASOUND GUIDED RIGHT LOWER QUADRANT PARACENTESIS MEDICATIONS: None. COMPLICATIONS: None immediate. PROCEDURE: Informed written consent was obtained from the patient after a discussion of the risks, benefits and alternatives to treatment. A timeout was performed prior to the initiation of the procedure. Initial ultrasound scanning demonstrates a large amount of ascites within the right lower abdominal quadrant. The right lower abdomen was prepped and draped in the usual sterile fashion. 1% lidocaine with epinephrine was used for local anesthesia. Following this, a 7 cm, 19 gauge, Yueh catheter was introduced. An ultrasound image was saved for documentation purposes. The paracentesis was performed. The catheter was removed and a dressing was applied. The  patient tolerated the procedure well without immediate post procedural complication. FINDINGS: A total of approximately 780 mL of clear yellow fluid was removed. Samples were sent to the laboratory as requested by the clinical team. IMPRESSION: Successful ultrasound-guided paracentesis yielding 780 mL liters of peritoneal fluid. Read by: Ascencion Dike PA-C Electronically Signed   By: Lucrezia Europe M.D.   On:  10/28/2015 11:29               Blood pressure 112/73, pulse 115, temperature 98.4 F (36.9 C), temperature source Oral, resp. rate 20, height _0  (1.676 m), weight 69.2 kg (152 lb 8.9 oz), SpO2 96 %.  Physical exam:   General--alert and cooperative and no distress. No signs of encephalopathy ENT-- markedly icteric  Neck-- supple with the lymphadenopathy Heart-- regular rate and rhythm without murmurs are gallops  Lungs-- clear  Abdomen-- distended with good bowel sounds with minimal tenderness liver seems to be a lot of mobile. Ascites is present  Psych-- alert and oriented    Assessment: 1. Alcoholic cirrhosis with apparent decompensation. This apparently is 1st time she has had ascites. We're still waiting for results from a diagnostic. Her Maddrey discriminant function is 73 and in view of the detectable alcohol in her blood yesterday of course of prednisolone 40 mg daily for 6 weeks or so would probably be beneficial. I would wait until the results of the paracentesis her back tomorrow.   Plan: we will check on results of the paracentesis tomorrow. Agree with supportive care. Have reinforced her the need to stop alcohol consumption   Kristina Morris,Kristina Morris 10/28/2015, 3:43 PM   This note was created using voice recognition software and minor errors may Have occurred unintentionally. Pager: [phone] If no answer or after hours call [phone]

## 2015-10-28 NOTE — Progress Notes (Signed)
PROGRESS NOTE    Kristina Morris  NFA:[id number] DOB: [birth year] DOA: 10/27/2015  PCP: Neena RhymesKatherine Tabori, MD   Brief Narrative:  50 year old female presenting with abdominal distention, nausea and occasional vomiting and 2 weeks of jaundice. She states that she stopped drinking alcohol about 2 months ago. Her abdomen has distended suddenly over the past week. She has known alcoholic cirrhosis of the liver but her abdomen has never been distended before.  Subjective: Pain improved after receiving morphine in the ER yesterday. No significant nausea at this time. No other complaints.  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  -Appears that she has developed severe hepatic failure secondary to cirrhosis as a result of alcohol abuse-tells me she has been checked for viral hepatitis in the past and this has been negative-notes certain is elevated-  GI feels this is an acute phase response -She has had a GI eval in the past-was never given the option for liver transplant-has only stopped drinking for about 2 months now - have consulted GI  Ascites -New onset abdominal distention and pain -paracentesis being done today to check for SBP -Will need diuretics prevent recurrence  Thrombocytopenia, elevated INR, portal hypertension -Due to alcohol abuse  Hypokalemia -Replace  Anemia -Anemia panel reveals high ferritin level, normal folate, normal b12- Iron is 80 IBC and saturation not calculated -No need to transfuse today   DVT prophylaxis: SCDs Code Status: full code Family Communication: none Disposition Plan: home when stable Consultants:   GI Procedures:   Paracentesis  Antimicrobials:  Anti-infectives    Start     Dose/Rate Route Frequency Ordered Stop   10/27/15 2030  cefTRIAXone (ROCEPHIN) 1 g in dextrose 5 % 50 mL IVPB     1 g 100 mL/hr over 30 Minutes Intravenous Every 24 hours 10/27/15 2002         Objective: Filed Vitals:   10/27/15 1941 10/27/15 2016 10/27/15 2054 10/28/15 0528  BP: 124/88  133/74 121/77  Pulse: 120  120 111  Temp:   98.9 F (37.2 C) 98.3 F (36.8 C)  TempSrc:   Oral Oral  Resp: 24  22 20   Height:  5\' 6"  (1.676 m)    Weight:  69.2 kg (152 lb 8.9 oz)  69.2 kg (152 lb 8.9 oz)  SpO2: 97%  100% 97%   No intake or output data in the 24 hours ending 10/28/15 1434 Filed Weights   10/27/15 1300 10/27/15 2016 10/28/15 0528  Weight: 63.504 kg (140 lb) 69.2 kg (152 lb 8.9 oz) 69.2 kg (152 lb 8.9 oz)    Examination: General exam: Appears comfortable - severely jaundiced HEENT: PERRLA, oral mucosa moist,+++ sclera icterus or thrush Respiratory system: Clear to auscultation. Respiratory effort normal. Cardiovascular system: S1 & S2 heard, RRR.  No murmurs  Gastrointestinal system: Abdomen soft, non-tender, moderately distended. Normal bowel sound. No organomegaly Central nervous system: Alert and oriented. No focal neurological deficits. Extremities: No cyanosis, clubbing or edema Skin: No rashes or ulcers Psychiatry:  Mood & affect appropriate.     Data Reviewed: I have personally reviewed following labs and imaging studies  CBC:  Recent Labs Lab 10/27/15 1334 10/28/15 0519  WBC 7.9 6.9  HGB 8.6* 7.6*  HCT 24.5* 21.8*  MCV 111.9* 113.5*  PLT 44* 43*   Basic Metabolic Panel:  Recent Labs Lab 10/27/15 1334 10/28/15 0519  NA 136 135  K 2.8* 3.0*  CL 95* 97*  CO2 27 31  GLUCOSE 113* 82  BUN 8 8  CREATININE <0.30* 0.33*  CALCIUM 8.4* 8.0*   GFR: Estimated Creatinine Clearance: 78.8 mL/min (by C-G formula based on Cr of 0.33). Liver Function Tests:  Recent Labs Lab 10/27/15 1334 10/28/15 0519  AST 220* 179*  ALT 37 31  ALKPHOS 112 94  BILITOT 18.3* 17.8*  PROT 7.6 6.7  ALBUMIN 2.4* 2.1*    Recent Labs Lab 10/27/15 1334  LIPASE 51   No results for input(s): AMMONIA in the last 168 hours. Coagulation Profile:  Recent Labs Lab 10/27/15 1605 10/28/15 0519   INR 1.86* 2.13*   Cardiac Enzymes: No results for input(s): CKTOTAL, CKMB, CKMBINDEX, TROPONINI in the last 168 hours. BNP (last 3 results) No results for input(s): PROBNP in the last 8760 hours. HbA1C: No results for input(s): HGBA1C in the last 72 hours. CBG: No results for input(s): GLUCAP in the last 168 hours. Lipid Profile: No results for input(s): CHOL, HDL, LDLCALC, TRIG, CHOLHDL, LDLDIRECT in the last 72 hours. Thyroid Function Tests: No results for input(s): TSH, T4TOTAL, FREET4, T3FREE, THYROIDAB in the last 72 hours. Anemia Panel:  Recent Labs  10/28/15 0943  VITAMINB12 1767*  FOLATE 8.2  FERRITIN 1234*  TIBC NOT CALCULATED  IRON 80  RETICCTPCT 4.9*   Urine analysis:    Component Value Date/Time   COLORURINE AMBER* 10/27/2015 1421   APPEARANCEUR TURBID* 10/27/2015 1421   LABSPEC 1.024 10/27/2015 1421   PHURINE 5.5 10/27/2015 1421   GLUCOSEU NEGATIVE 10/27/2015 1421   HGBUR NEGATIVE 10/27/2015 1421   BILIRUBINUR LARGE* 10/27/2015 1421   KETONESUR 40* 10/27/2015 1421   PROTEINUR NEGATIVE 10/27/2015 1421   UROBILINOGEN 0.2 06/23/2009 1732   NITRITE POSITIVE* 10/27/2015 1421   LEUKOCYTESUR MODERATE* 10/27/2015 1421   Sepsis Labs: (procalcitonin:4,lacticidven:4) )No results found for this or any previous visit (from the past 240 hour(s)).       Radiology Studies: Ct Abdomen Pelvis W Contrast  10/27/2015  CLINICAL DATA:  50 year old with low abdominal pain, nausea and vomiting for 3 days. Abdominal distention. History of alcoholic liver disease. EXAM: CT ABDOMEN AND PELVIS WITH CONTRAST TECHNIQUE: Multidetector CT imaging of the abdomen and pelvis was performed using the standard protocol following bolus administration of intravenous contrast. CONTRAST:  ISOVUE-300 IOPAMIDOL (ISOVUE-300) INJECTION 61% COMPARISON:  None. FINDINGS: Lower chest: There is a dependent small to moderate right-sided pleural effusion. There is trace pleural fluid on  the left. There is mild atelectasis dependently at both lung bases. No significant pericardial effusion. Hepatobiliary: The liver is diffusely abnormal with innumerable low-density lesions throughout all segments. The liver is enlarged, measuring 22.2 x 12.0 x 17.9 cm. Its contours are mildly irregular. No dominant hypervascular lesions are seen. The gallbladder is distended with mild wall thickening. There are small calcified gallstones and/or calcifications within the gallbladder wall. No significant biliary dilatation. Pancreas: Unremarkable. No pancreatic ductal dilatation or surrounding inflammatory changes. Spleen: Splenic size is at the upper limits of normal. The spleen measures 13.9 x 12.7 x 7.1 cm for an estimated volume of 627 ml. No focal splenic lesions are identified. Adrenals/Urinary Tract: Both adrenal glands appear normal. The kidneys appear normal without evidence of urinary tract calculus, suspicious lesion or hydronephrosis. No bladder abnormalities are seen. Stomach/Bowel: There is a small hiatal hernia. The stomach and small bowel appear unremarkable. There is mild nonspecific right colonic wall thickening which appears nonfocal. The appendix is not clearly seen, although may extend posteriorly from the cecum into ascites. There is no evidence  of pericecal inflammation. Vascular/Lymphatic: There are mildly prominent lymph nodes in the porta hepatis, likely related to chronic liver disease. No retroperitoneal lymphadenopathy. No significant arterial abnormalities identified. The portal, superior mesenteric and splenic veins are patent. There are several retroperitoneal venous collaterals on the left. Reproductive: The uterus appears normal. No evidence of adnexal mass. Other: Moderate ascites, greatest volume in the pelvis. No peritoneal nodularity identified. Musculoskeletal: No acute or significant osseous findings. Degenerative disc disease noted at L5-S1. IMPRESSION: 1. Distended gallbladder  with wall thickening, cholelithiasis and/or gallbladder wall calcification. In light of the patient's liver disease and ascites, these findings are not entirely specific. If cholecystitis is a clinical concern, correlation with ultrasound may be helpful. 2. Chronic liver disease with hepatomegaly, diffuse contour irregularity and innumerable low-density liver lesions, probably representing cirrhosis with regenerating nodules. No focal hypervascular lesions identified on routine imaging. 3. Ascites and retroperitoneal collaterals on the left suspicious for early portal hypertension. Splenic size is at the upper limits of normal. 4. Nonspecific proximal colonic wall thickening, likely related to liver disease and ascites. No evidence of bowel obstruction. 5. Right-greater-than-left pleural effusions with associated bibasilar atelectasis. Electronically Signed   By: Carey Bullocks M.D.   On: 10/27/2015 16:58   US Paracentesis  10/28/2015  INDICATION: Abdominal distention secondary to ascites. Cirrhosis. Request diagnostic and therapeutic paracentesis. EXAM: ULTRASOUND GUIDED RIGHT LOWER QUADRANT PARACENTESIS MEDICATIONS: None. COMPLICATIONS: None immediate. PROCEDURE: Informed written consent was obtained from the patient after a discussion of the risks, benefits and alternatives to treatment. A timeout was performed prior to the initiation of the procedure. Initial ultrasound scanning demonstrates a large amount of ascites within the right lower abdominal quadrant. The right lower abdomen was prepped and draped in the usual sterile fashion. 1% lidocaine with epinephrine was used for local anesthesia. Following this, a 7 cm, 19 gauge, Yueh catheter was introduced. An ultrasound image was saved for documentation purposes. The paracentesis was performed. The catheter was removed and a dressing was applied. The patient tolerated the procedure well without immediate post procedural complication. FINDINGS: A total of  approximately 780 mL of clear yellow fluid was removed. Samples were sent to the laboratory as requested by the clinical team. IMPRESSION: Successful ultrasound-guided paracentesis yielding 780 mL liters of peritoneal fluid. Read by: Brayton El PA-C Electronically Signed   By: Corlis Leak M.D.   On: 10/28/2015 11:29      Scheduled Meds: . cefTRIAXone (ROCEPHIN)  IV  1 g Intravenous Q24H  . folic acid  1 mg Oral Daily  . pantoprazole  40 mg Oral Daily  . sodium chloride flush  3 mL Intravenous Q12H  . thiamine  100 mg Oral Daily   Continuous Infusions:    LOS: 1 day    Time spent in minutes: 35    Jolanta Cabeza, MD [facility] Pager: [email or web address] Password [facility] 10/28/2015, 2:34 PM

## 2015-10-28 NOTE — Procedures (Signed)
Successful US guided paracentesis from RLQ.  Yielded 780mL of clear yellow fluid.  No immediate complications.  Pt tolerated well.   Specimen was sent for labs.  Brayton ElBRUNING, Machel Violante PA-C 10/28/2015 11:27 AM

## 2015-10-29 DIAGNOSIS — K7460 Unspecified cirrhosis of liver: Secondary | ICD-10-CM

## 2015-10-29 DIAGNOSIS — D696 Thrombocytopenia, unspecified: Secondary | ICD-10-CM

## 2015-10-29 DIAGNOSIS — R791 Abnormal coagulation profile: Secondary | ICD-10-CM

## 2015-10-29 DIAGNOSIS — D638 Anemia in other chronic diseases classified elsewhere: Secondary | ICD-10-CM

## 2015-10-29 DIAGNOSIS — R188 Other ascites: Secondary | ICD-10-CM

## 2015-10-29 LAB — COMPREHENSIVE METABOLIC PANEL
ALBUMIN: 2.1 g/dL — AB (ref 3.5–5.0)
ALK PHOS: 90 U/L (ref 38–126)
ALT: 33 U/L (ref 14–54)
ANION GAP: 8 (ref 5–15)
AST: 179 U/L — AB (ref 15–41)
BILIRUBIN TOTAL: 18 mg/dL — AB (ref 0.3–1.2)
BUN: 6 mg/dL (ref 6–20)
CHLORIDE: 92 mmol/L — AB (ref 101–111)
CO2: 33 mmol/L — AB (ref 22–32)
Calcium: 7.9 mg/dL — ABNORMAL LOW (ref 8.9–10.3)
Creatinine, Ser: 0.32 mg/dL — ABNORMAL LOW (ref 0.44–1.00)
GFR calc Af Amer: >60 mL/min (ref >60)
GFR calc non Af Amer: >60 mL/min (ref >60)
GLUCOSE: 98 mg/dL (ref 65–99)
POTASSIUM: 2.5 mmol/L — AB (ref 3.5–5.1)
Sodium: 133 mmol/L — ABNORMAL LOW (ref 135–145)
TOTAL PROTEIN: 6.6 g/dL (ref 6.5–8.1)

## 2015-10-29 LAB — PROTIME-INR
INR: 2.12 — AB (ref 0.00–1.49)
Prothrombin Time: 22.9 seconds — ABNORMAL HIGH (ref 11.6–15.2)

## 2015-10-29 LAB — CBC
HEMATOCRIT: 21.3 % — AB (ref 36.0–46.0)
HEMOGLOBIN: 7.5 g/dL — AB (ref 12.0–15.0)
MCH: 39.3 pg — AB (ref 26.0–34.0)
MCHC: 35.2 g/dL (ref 30.0–36.0)
MCV: 111.5 fL — ABNORMAL HIGH (ref 78.0–100.0)
Platelets: 43 10*3/uL — ABNORMAL LOW (ref 150–400)
RBC: 1.91 MIL/uL — ABNORMAL LOW (ref 3.87–5.11)
RDW: 16.8 % — ABNORMAL HIGH (ref 11.5–15.5)
WBC: 6.8 10*3/uL (ref 4.0–10.5)

## 2015-10-29 LAB — AFP TUMOR MARKER: AFP-Tumor Marker: 4.6 ng/mL (ref 0.0–8.3)

## 2015-10-29 LAB — MAGNESIUM: Magnesium: 1 mg/dL — ABNORMAL LOW (ref 1.7–2.4)

## 2015-10-29 MED ORDER — PREDNISOLONE 5 MG PO TABS
40.0000 mg | ORAL_TABLET | Freq: Every day | ORAL | Status: DC
  Administered 2015-10-29 – 2015-10-31 (×3): 40 mg via ORAL
  Filled 2015-10-29 (×5): qty 8

## 2015-10-29 MED ORDER — POTASSIUM CHLORIDE CRYS ER 20 MEQ PO TBCR
30.0000 meq | EXTENDED_RELEASE_TABLET | ORAL | Status: AC
  Administered 2015-10-29 (×2): 30 meq via ORAL
  Filled 2015-10-29 (×2): qty 1

## 2015-10-29 MED ORDER — PREDNISONE 20 MG PO TABS
40.0000 mg | ORAL_TABLET | Freq: Every day | ORAL | Status: DC
  Filled 2015-10-29 (×2): qty 2

## 2015-10-29 NOTE — Progress Notes (Signed)
[facility] PROGRESS NOTE Subjective patient states that she feels pretty bad. They discomfort but no severe abdominal pain. Her Maddrey discriminant function 73 indicating significant alcoholic hepatitis. When I told her that she did have alcohol in her blood when she was admitted she did not dispute this. Cell count paracentesis fluid 16.  Objective: Vital signs in last 24 hours: Temp:  [98.4 F (36.9 C)-99 F (37.2 C)] 98.8 F (37.1 C) (06/16 0252) Pulse Rate:  [115-120] 120 (06/16 0252) Resp:  [18-20] 18 (06/16 0252) BP: (111-119)/(73-84) 119/84 mmHg (06/16 0252) SpO2:  [93 %-96 %] 93 % (06/16 0252) Weight:  [70.9 kg (156 lb 4.9 oz)] 70.9 kg (156 lb 4.9 oz) (06/16 0500) Last BM Date: 10/27/15  Intake/Output from previous day: 06/15 0701 - 06/16 0700 In: 240 [P.O.:240] Out: -  Intake/Output this shift:    PE: General--alert oriented icteric  Abdomen-- slightly distended minimal tender.   Lab Results:  Recent Labs  10/27/15 1334 10/28/15 0519 10/29/15 0457  WBC 7.9 6.9 6.8  HGB 8.6* 7.6* 7.5*  HCT 24.5* 21.8* 21.3*  PLT 44* 43* 43*   BMET  Recent Labs  10/27/15 1334 10/28/15 0519 10/29/15 0457  NA 136 135 133*  K 2.8* 3.0* 2.5*  CL 95* 97* 92*  CO2 27 31 33*  CREATININE <0.30* 0.33* 0.32*   LFT  Recent Labs  10/27/15 1334 10/28/15 0519 10/29/15 0457  PROT 7.6 6.7 6.6  AST 220* 179* 179*  ALT 37 31 33  ALKPHOS 112 94 90  BILITOT 18.3* 17.8* 18.0*   PT/INR  Recent Labs  10/27/15 1605 10/28/15 0519 10/29/15 0457  LABPROT 20.8* 23.0* 22.9*  INR 1.86* 2.13* 2.12*   PANCREAS  Recent Labs  10/27/15 1334  LIPASE 51         Studies/Results: Ct Abdomen Pelvis W Contrast  10/27/2015  CLINICAL DATA:  50 year old with low abdominal pain, nausea and vomiting for 3 days. Abdominal distention. History of alcoholic liver disease. EXAM: CT ABDOMEN AND PELVIS WITH CONTRAST TECHNIQUE: Multidetector CT imaging of the abdomen and  pelvis was performed using the standard protocol following bolus administration of intravenous contrast. CONTRAST:  ISOVUE-300 IOPAMIDOL (ISOVUE-300) INJECTION 61% COMPARISON:  None. FINDINGS: Lower chest: There is a dependent small to moderate right-sided pleural effusion. There is trace pleural fluid on the left. There is mild atelectasis dependently at both lung bases. No significant pericardial effusion. Hepatobiliary: The liver is diffusely abnormal with innumerable low-density lesions throughout all segments. The liver is enlarged, measuring 22.2 x 12.0 x 17.9 cm. Its contours are mildly irregular. No dominant hypervascular lesions are seen. The gallbladder is distended with mild wall thickening. There are small calcified gallstones and/or calcifications within the gallbladder wall. No significant biliary dilatation. Pancreas: Unremarkable. No pancreatic ductal dilatation or surrounding inflammatory changes. Spleen: Splenic size is at the upper limits of normal. The spleen measures 13.9 x 12.7 x 7.1 cm for an estimated volume of 627 ml. No focal splenic lesions are identified. Adrenals/Urinary Tract: Both adrenal glands appear normal. The kidneys appear normal without evidence of urinary tract calculus, suspicious lesion or hydronephrosis. No bladder abnormalities are seen. Stomach/Bowel: There is a small hiatal hernia. The stomach and small bowel appear unremarkable. There is mild nonspecific right colonic wall thickening which appears nonfocal. The appendix is not clearly seen, although may extend posteriorly from the cecum into ascites. There is no evidence of pericecal inflammation. Vascular/Lymphatic: There are mildly prominent lymph nodes in the porta hepatis, likely related  to chronic liver disease. No retroperitoneal lymphadenopathy. No significant arterial abnormalities identified. The portal, superior mesenteric and splenic veins are patent. There are several retroperitoneal venous collaterals  on the left. Reproductive: The uterus appears normal. No evidence of adnexal mass. Other: Moderate ascites, greatest volume in the pelvis. No peritoneal nodularity identified. Musculoskeletal: No acute or significant osseous findings. Degenerative disc disease noted at L5-S1. IMPRESSION: 1. Distended gallbladder with wall thickening, cholelithiasis and/or gallbladder wall calcification. In light of the patient's liver disease and ascites, these findings are not entirely specific. If cholecystitis is a clinical concern, correlation with ultrasound may be helpful. 2. Chronic liver disease with hepatomegaly, diffuse contour irregularity and innumerable low-density liver lesions, probably representing cirrhosis with regenerating nodules. No focal hypervascular lesions identified on routine imaging. 3. Ascites and retroperitoneal collaterals on the left suspicious for early portal hypertension. Splenic size is at the upper limits of normal. 4. Nonspecific proximal colonic wall thickening, likely related to liver disease and ascites. No evidence of bowel obstruction. 5. Right-greater-than-left pleural effusions with associated bibasilar atelectasis. Electronically Signed   By: Carey BullocksWilliam  Veazey M.D.   On: 10/27/2015 16:58   [place] Paracentesis  10/28/2015  INDICATION: Abdominal distention secondary to ascites. Cirrhosis. Request diagnostic and therapeutic paracentesis. EXAM: ULTRASOUND GUIDED RIGHT LOWER QUADRANT PARACENTESIS MEDICATIONS: None. COMPLICATIONS: None immediate. PROCEDURE: Informed written consent was obtained from the patient after a discussion of the risks, benefits and alternatives to treatment. A timeout was performed prior to the initiation of the procedure. Initial ultrasound scanning demonstrates a large amount of ascites within the right lower abdominal quadrant. The right lower abdomen was prepped and draped in the usual sterile fashion. 1% lidocaine with epinephrine was used for local anesthesia.  Following this, a 7 cm, 19 gauge, Yueh catheter was introduced. An ultrasound image was saved for documentation purposes. The paracentesis was performed. The catheter was removed and a dressing was applied. The patient tolerated the procedure well without immediate post procedural complication. FINDINGS: A total of approximately 780 mL of clear yellow fluid was removed. Samples were sent to the laboratory as requested by the clinical team. IMPRESSION: Successful ultrasound-guided paracentesis yielding 780 mL liters of peritoneal fluid. Read by: Brayton ElKevin Bruning PA-C Electronically Signed   By: Corlis Leak  Hassell M.D.   On: 10/28/2015 11:29    Medications: I have reviewed the patient's current medications.  Assessment/Plan: 1. Alcoholic hepatitis. Paracentesis fluid does not indicate SPP. At this point I would go ahead and begin her on prednisolone 40 mg daily 4 to 6 weeks. If she is unable to afford this is an outpatient she could be discharged on prednisone which is much cheaper. [facility] weeks they could adjust this appropriately. 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    Justen Fonda JR,Elvert Cumpton L 10/29/2015, 8:54 AM  This note was created using voice recognition software. Minor errors may Have occurred unintentionally.  Pager: [phone] If no answer or after hours call [phone]

## 2015-10-29 NOTE — Progress Notes (Signed)
PROGRESS NOTE    Kristina Morris  ZOX:[id number] DOB: February 23, 1966 DOA: 10/27/2015  PCP: Neena RhymesKatherine Tabori, MD   Brief Narrative:  50 year old female presenting with abdominal distention, nausea and occasional vomiting and 2 weeks of jaundice. She states that she stopped drinking alcohol about 2 months ago. Her abdomen has distended suddenly over the past week. She has known alcoholic cirrhosis of the liver but her abdomen has never been distended before.  Subjective: No abdominal pain, nausea or vomiting. Urinating well with Lasix- explained the need for diuretics and the need to monitor PO fluid intake carefully.   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  -Appears that she has developed severe hepatic failure secondary to cirrhosis as a result of alcohol abuse-tells me she has been checked for viral hepatitis in the past and this has been negative - ferretin is elevated-  GI feels this is an acute phase response rather than a sign of hemochromatosis  -She has had a GI eval in the past-was never given the option for liver transplant-has only stopped drinking for about 2 months now - have consulted GI- has been started on prednisolone for acute alcoholic hepatitis  Ascites -New onset abdominal distention and pain -paracentesis done- 780 cc removed- non consistent with SBP- abdominal distension not much improved today -started lasix to prevent recurrence and explained not to drink water in excess which she tells me she has been doing to "flush" her system  Thrombocytopenia, elevated INR, portal hypertension -Due to alcohol abuse  Hypokalemia -Replace  Anemia -Anemia panel reveals high ferritin level, normal folate, normal b12- Iron is 80 IBC and saturation not calculated -No need to transfuse today   DVT prophylaxis: SCDs Code Status: full code Family Communication: none Disposition Plan: home when stable Consultants:   GI Procedures:    Paracentesis  Antimicrobials:  Anti-infectives    Start     Dose/Rate Route Frequency Ordered Stop   10/27/15 2030  cefTRIAXone (ROCEPHIN) 1 g in dextrose 5 % 50 mL IVPB  Status:  Discontinued     1 g 100 mL/hr over 30 Minutes Intravenous Every 24 hours 10/27/15 2002 10/29/15 0742       Objective: Filed Vitals:   10/28/15 2000 10/28/15 2119 10/29/15 0252 10/29/15 0500  BP:  111/76 119/84   Pulse: 120 120 120   Temp:  99 F (37.2 C) 98.8 F (37.1 C)   TempSrc:  Oral Oral   Resp:  19 18   Height:    5\' 6"  (1.676 m)  Weight:    70.9 kg (156 lb 4.9 oz)  SpO2:  95% 93%     Intake/Output Summary (Last 24 hours) at 10/29/15 1133 Last data filed at 10/28/15 1625  Gross per 24 hour  Intake    240 ml  Output      0 ml  Net    240 ml   Filed Weights   10/27/15 2016 10/28/15 0528 10/29/15 0500  Weight: 69.2 kg (152 lb 8.9 oz) 69.2 kg (152 lb 8.9 oz) 70.9 kg (156 lb 4.9 oz)    Examination: General exam: Appears comfortable - severely jaundiced HEENT: PERRLA, oral mucosa moist,+++ sclera icterus or thrush Respiratory system: Clear to auscultation. Respiratory effort normal. Cardiovascular system: S1 & S2 heard, RRR.  No murmurs  Gastrointestinal system: Abdomen soft, non-tender, moderately distended. Normal bowel sound. No organomegaly Central nervous system: Alert and oriented. No focal neurological deficits. Extremities: No cyanosis, clubbing or  edema Skin: No rashes or ulcers Psychiatry:  Mood & affect appropriate.     Data Reviewed: I have personally reviewed following labs and imaging studies  CBC:  Recent Labs Lab 10/27/15 1334 10/28/15 0519 10/29/15 0457  WBC 7.9 6.9 6.8  HGB 8.6* 7.6* 7.5*  HCT 24.5* 21.8* 21.3*  MCV 111.9* 113.5* 111.5*  PLT 44* 43* 43*   Basic Metabolic Panel:  Recent Labs Lab 10/27/15 1334 10/28/15 0519 10/29/15 0457  NA 136 135 133*  K 2.8* 3.0* 2.5*  CL 95* 97* 92*  CO2 27 31 33*  GLUCOSE 113* 82 98  BUN CREATININE <0.30* 0.33* 0.32*  CALCIUM 8.4* 8.0* 7.9*  MG  --   --  1.0*   GFR: Estimated Creatinine Clearance: 78.8 mL/min (by C-G formula based on Cr of 0.32). Liver Function Tests:  Recent Labs Lab 10/27/15 1334 10/28/15 0519 10/29/15 0457  AST 220* 179* 179*  ALT 37 31 33  ALKPHOS 112 94 90  BILITOT 18.3* 17.8* 18.0*  PROT 7.6 6.7 6.6  ALBUMIN 2.4* 2.1* 2.1*    Recent Labs Lab 10/27/15 1334  LIPASE 51   No results for input(s): AMMONIA in the last 168 hours. Coagulation Profile:  Recent Labs Lab 10/27/15 1605 10/28/15 0519 10/29/15 0457  INR 1.86* 2.13* 2.12*   Cardiac Enzymes: No results for input(s): CKTOTAL, CKMB, CKMBINDEX, TROPONINI in the last 168 hours. BNP (last 3 results) No results for input(s): PROBNP in the last 8760 hours. HbA1C: No results for input(s): HGBA1C in the last 72 hours. CBG: No results for input(s): GLUCAP in the last 168 hours. Lipid Profile: No results for input(s): CHOL, HDL, LDLCALC, TRIG, CHOLHDL, LDLDIRECT in the last 72 hours. Thyroid Function Tests: No results for input(s): TSH, T4TOTAL, FREET4, T3FREE, THYROIDAB in the last 72 hours. Anemia Panel:  Recent Labs  10/28/15 0943  VITAMINB12 1767*  FOLATE 8.2  FERRITIN 1234*  TIBC NOT CALCULATED  IRON 80  RETICCTPCT 4.9*   Urine analysis:    Component Value Date/Time   COLORURINE AMBER* 10/27/2015 1421   APPEARANCEUR TURBID* 10/27/2015 1421   LABSPEC 1.024 10/27/2015 1421   PHURINE 5.5 10/27/2015 1421   GLUCOSEU NEGATIVE 10/27/2015 1421   HGBUR NEGATIVE 10/27/2015 1421   BILIRUBINUR LARGE* 10/27/2015 1421   KETONESUR 40* 10/27/2015 1421   PROTEINUR NEGATIVE 10/27/2015 1421   UROBILINOGEN 0.2 06/23/2009 1732   NITRITE POSITIVE* 10/27/2015 1421   LEUKOCYTESUR MODERATE* 10/27/2015 1421   Sepsis Labs: (procalcitonin:4,lacticidven:4) ) Recent Results (from the past 240 hour(s))  Urine culture     Status: Abnormal   Collection Time: 10/27/15  2:21  PM  Result Value Ref Range Status   Specimen Description URINE, RANDOM  Final   Special Requests NONE  Final   Culture (A)  Final    3,000 COLONIES/mL INSIGNIFICANT GROWTH Performed at [facility]    Report Status 10/28/2015 FINAL  Final  Gram stain     Status: None   Collection Time: 10/28/15  2:29 PM  Result Value Ref Range Status   Specimen Description FLUID ASCITIC  Final   Special Requests NONE  Final   Gram Stain   Final    RARE WBC PRESENT, PREDOMINANTLY PMN NO ORGANISMS SEEN Performed at [facility]    Report Status 10/28/2015 FINAL  Final         Radiology Studies: Ct Abdomen Pelvis W Contrast  10/27/2015  CLINICAL DATA:  50 year old with low abdominal pain, nausea  and vomiting for 3 days. Abdominal distention. History of alcoholic liver disease. EXAM: CT ABDOMEN AND PELVIS WITH CONTRAST TECHNIQUE: Multidetector CT imaging of the abdomen and pelvis was performed using the standard protocol following bolus administration of intravenous contrast. CONTRAST:  ISOVUE-300 IOPAMIDOL (ISOVUE-300) INJECTION 61% COMPARISON:  None. FINDINGS: Lower chest: There is a dependent small to moderate right-sided pleural effusion. There is trace pleural fluid on the left. There is mild atelectasis dependently at both lung bases. No significant pericardial effusion. Hepatobiliary: The liver is diffusely abnormal with innumerable low-density lesions throughout all segments. The liver is enlarged, measuring 22.2 x 12.0 x 17.9 cm. Its contours are mildly irregular. No dominant hypervascular lesions are seen. The gallbladder is distended with mild wall thickening. There are small calcified gallstones and/or calcifications within the gallbladder wall. No significant biliary dilatation. Pancreas: Unremarkable. No pancreatic ductal dilatation or surrounding inflammatory changes. Spleen: Splenic size is at the upper limits of normal. The spleen measures 13.9 x 12.7 x 7.1 cm for an  estimated volume of 627 ml. No focal splenic lesions are identified. Adrenals/Urinary Tract: Both adrenal glands appear normal. The kidneys appear normal without evidence of urinary tract calculus, suspicious lesion or hydronephrosis. No bladder abnormalities are seen. Stomach/Bowel: There is a small hiatal hernia. The stomach and small bowel appear unremarkable. There is mild nonspecific right colonic wall thickening which appears nonfocal. The appendix is not clearly seen, although may extend posteriorly from the cecum into ascites. There is no evidence of pericecal inflammation. Vascular/Lymphatic: There are mildly prominent lymph nodes in the porta hepatis, likely related to chronic liver disease. No retroperitoneal lymphadenopathy. No significant arterial abnormalities identified. The portal, superior mesenteric and splenic veins are patent. There are several retroperitoneal venous collaterals on the left. Reproductive: The uterus appears normal. No evidence of adnexal mass. Other: Moderate ascites, greatest volume in the pelvis. No peritoneal nodularity identified. Musculoskeletal: No acute or significant osseous findings. Degenerative disc disease noted at L5-S1. IMPRESSION: 1. Distended gallbladder with wall thickening, cholelithiasis and/or gallbladder wall calcification. In light of the patient's liver disease and ascites, these findings are not entirely specific. If cholecystitis is a clinical concern, correlation with ultrasound may be helpful. 2. Chronic liver disease with hepatomegaly, diffuse contour irregularity and innumerable low-density liver lesions, probably representing cirrhosis with regenerating nodules. No focal hypervascular lesions identified on routine imaging. 3. Ascites and retroperitoneal collaterals on the left suspicious for early portal hypertension. Splenic size is at the upper limits of normal. 4. Nonspecific proximal colonic wall thickening, likely related to liver disease and  ascites. No evidence of bowel obstruction. 5. Right-greater-than-left pleural effusions with associated bibasilar atelectasis. Electronically Signed   By: Carey Bullocks M.D.   On: 10/27/2015 16:58   US Paracentesis  10/28/2015  INDICATION: Abdominal distention secondary to ascites. Cirrhosis. Request diagnostic and therapeutic paracentesis. EXAM: ULTRASOUND GUIDED RIGHT LOWER QUADRANT PARACENTESIS MEDICATIONS: None. COMPLICATIONS: None immediate. PROCEDURE: Informed written consent was obtained from the patient after a discussion of the risks, benefits and alternatives to treatment. A timeout was performed prior to the initiation of the procedure. Initial ultrasound scanning demonstrates a large amount of ascites within the right lower abdominal quadrant. The right lower abdomen was prepped and draped in the usual sterile fashion. 1% lidocaine with epinephrine was used for local anesthesia. Following this, a 7 cm, 19 gauge, Yueh catheter was introduced. An ultrasound image was saved for documentation purposes. The paracentesis was performed. The catheter was removed and a dressing was applied. The patient  tolerated the procedure well without immediate post procedural complication. FINDINGS: A total of approximately 780 mL of clear yellow fluid was removed. Samples were sent to the laboratory as requested by the clinical team. IMPRESSION: Successful ultrasound-guided paracentesis yielding 780 mL liters of peritoneal fluid. Read by: Brayton El PA-C Electronically Signed   By: Corlis Leak M.D.   On: 10/28/2015 11:29      Scheduled Meds: . folic acid  1 mg Oral Daily  . furosemide  20 mg Intravenous Q8H  . pantoprazole  40 mg Oral Daily  . prednisoLONE  40 mg Oral Q breakfast  . sodium chloride flush  3 mL Intravenous Q12H  . thiamine  100 mg Oral Daily   Continuous Infusions:    LOS: 2 days    Time spent in minutes: 35    Darryon Bastin, MD [facility] Pager: [email or web address] Password  [facility] 10/29/2015, 11:33 AM

## 2015-10-30 ENCOUNTER — Encounter (HOSPITAL_COMMUNITY): Payer: Self-pay | Admitting: *Deleted

## 2015-10-30 DIAGNOSIS — R188 Other ascites: Secondary | ICD-10-CM

## 2015-10-30 DIAGNOSIS — F1024 Alcohol dependence with alcohol-induced mood disorder: Secondary | ICD-10-CM

## 2015-10-30 DIAGNOSIS — D638 Anemia in other chronic diseases classified elsewhere: Secondary | ICD-10-CM

## 2015-10-30 DIAGNOSIS — D696 Thrombocytopenia, unspecified: Secondary | ICD-10-CM

## 2015-10-30 DIAGNOSIS — R791 Abnormal coagulation profile: Secondary | ICD-10-CM

## 2015-10-30 DIAGNOSIS — K7031 Alcoholic cirrhosis of liver with ascites: Secondary | ICD-10-CM

## 2015-10-30 DIAGNOSIS — E876 Hypokalemia: Secondary | ICD-10-CM

## 2015-10-30 DIAGNOSIS — R17 Unspecified jaundice: Secondary | ICD-10-CM

## 2015-10-30 DIAGNOSIS — K7011 Alcoholic hepatitis with ascites: Secondary | ICD-10-CM

## 2015-10-30 LAB — BASIC METABOLIC PANEL
Anion gap: 10 (ref 5–15)
Anion gap: 8 (ref 5–15)
BUN: 9 mg/dL (ref 6–20)
BUN: 8 mg/dL (ref 6–20)
CALCIUM: 7.7 mg/dL — AB (ref 8.9–10.3)
CHLORIDE: 91 mmol/L — AB (ref 101–111)
CO2: 32 mmol/L (ref 22–32)
CO2: 33 mmol/L — ABNORMAL HIGH (ref 22–32)
CREATININE: 0.34 mg/dL — AB (ref 0.44–1.00)
CREATININE: 0.35 mg/dL — AB (ref 0.44–1.00)
Calcium: 7.8 mg/dL — ABNORMAL LOW (ref 8.9–10.3)
Chloride: 89 mmol/L — ABNORMAL LOW (ref 101–111)
GFR calc Af Amer: >60 mL/min (ref >60)
GFR calc Af Amer: >60 mL/min (ref >60)
GFR calc non Af Amer: >60 mL/min (ref >60)
GLUCOSE: 160 mg/dL — AB (ref 65–99)
Glucose, Bld: 100 mg/dL — ABNORMAL HIGH (ref 65–99)
POTASSIUM: 2.4 mmol/L — AB (ref 3.5–5.1)
Potassium: 3 mmol/L — ABNORMAL LOW (ref 3.5–5.1)
Sodium: 133 mmol/L — ABNORMAL LOW (ref 135–145)
Sodium: 130 mmol/L — ABNORMAL LOW (ref 135–145)

## 2015-10-30 LAB — CBC
HEMATOCRIT: 22.7 % — AB (ref 36.0–46.0)
Hemoglobin: 7.8 g/dL — ABNORMAL LOW (ref 12.0–15.0)
MCH: 38.2 pg — AB (ref 26.0–34.0)
MCHC: 34.4 g/dL (ref 30.0–36.0)
MCV: 111.3 fL — AB (ref 78.0–100.0)
PLATELETS: 67 10*3/uL — AB (ref 150–400)
RBC: 2.04 MIL/uL — AB (ref 3.87–5.11)
RDW: 16.9 % — AB (ref 11.5–15.5)
WBC: 11.3 10*3/uL — ABNORMAL HIGH (ref 4.0–10.5)

## 2015-10-30 LAB — MAGNESIUM: Magnesium: 1 mg/dL — ABNORMAL LOW (ref 1.7–2.4)

## 2015-10-30 MED ORDER — POTASSIUM CHLORIDE 10 MEQ/100ML IV SOLN
10.0000 meq | INTRAVENOUS | Status: AC
  Administered 2015-10-30 (×4): 10 meq via INTRAVENOUS
  Filled 2015-10-30 (×4): qty 100

## 2015-10-30 MED ORDER — POTASSIUM CHLORIDE CRYS ER 20 MEQ PO TBCR
40.0000 meq | EXTENDED_RELEASE_TABLET | ORAL | Status: AC
  Administered 2015-10-30 (×2): 40 meq via ORAL
  Filled 2015-10-30 (×2): qty 2

## 2015-10-30 MED ORDER — SPIRONOLACTONE 25 MG PO TABS
25.0000 mg | ORAL_TABLET | Freq: Once | ORAL | Status: AC
  Administered 2015-10-30: 25 mg via ORAL
  Filled 2015-10-30: qty 1

## 2015-10-30 MED ORDER — PREDNISOLONE 5 MG PO TABS: 40.0000 mg | ORAL_TABLET | Freq: Every day | ORAL | Status: DC

## 2015-10-30 MED ORDER — SPIRONOLACTONE 25 MG PO TABS: 25.0000 mg | ORAL_TABLET | Freq: Every day | ORAL | Status: DC

## 2015-10-30 MED ORDER — FUROSEMIDE 20 MG PO TABS: 40.0000 mg | ORAL_TABLET | Freq: Every day | ORAL | Status: DC

## 2015-10-30 MED ORDER — TRAMADOL HCL 50 MG PO TABS: 50.0000 mg | ORAL_TABLET | Freq: Two times a day (BID) | ORAL | Status: DC | PRN

## 2015-10-30 MED ORDER — POTASSIUM CHLORIDE CRYS ER 20 MEQ PO TBCR: 40.0000 meq | EXTENDED_RELEASE_TABLET | ORAL | Status: DC

## 2015-10-30 MED ORDER — MAGNESIUM SULFATE 50 % IJ SOLN
3.0000 g | Freq: Once | INTRAVENOUS | Status: AC
  Administered 2015-10-30: 3 g via INTRAVENOUS
  Filled 2015-10-30: qty 6

## 2015-10-30 MED ORDER — POTASSIUM CHLORIDE 10 MEQ/100ML IV SOLN
10.0000 meq | INTRAVENOUS | Status: AC
Start: 2015-10-30 — End: 2015-10-30
  Administered 2015-10-30 (×4): 10 meq via INTRAVENOUS
  Filled 2015-10-30 (×4): qty 100

## 2015-10-30 NOTE — Progress Notes (Signed)
Critical lab value: potassium level of 2.4. Dr. Butler Denmarkizwan on the floor and notified of the result. New orders received.

## 2015-10-30 NOTE — Progress Notes (Signed)
PROGRESS NOTE    Kristina Morris  [id number] DOB: 12-Mar-1966 DOA: 10/27/2015  PCP: Neena RhymesKatherine Tabori, MD   Brief Narrative:  50 year old female presenting with abdominal distention, nausea and occasional vomiting and 2 weeks of jaundice. She states that she stopped drinking alcohol about 2 months ago. Her abdomen has distended suddenly over the past week. She has known alcoholic cirrhosis of the liver but her abdomen has never been distended before.  Subjective: No complaints. Urinating well.   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  -Appears that she has developed severe hepatic failure secondary to cirrhosis as a result of alcohol abuse-tells me she has been checked for viral hepatitis in the past and this has been negative - ferretin is elevated-  GI feels this is an acute phase response rather than a sign of hemochromatosis  - have consulted GI- has been started on prednisolone for acute alcoholic hepatitis  Ascites -New onset abdominal distention and pain -paracentesis done- 780 cc removed- non consistent with SBP -started lasix to prevent recurrence and explained not to drink water in excess which she tells me she has been doing to "flush" her system - started Lasix, limit fluid intake- in negative balance by about 2 L  Hypokalemia/hypomagnesemia - replace  Thrombocytopenia, elevated INR, portal hypertension -Due to alcohol abuse  Anemia -Anemia panel reveals high ferritin level, normal folate, normal b12- Iron is 80-  IBC and saturation not calculated -No need to transfuse today   DVT prophylaxis: SCDs Code Status: full code Family Communication: none Disposition Plan: home when stable Consultants:   GI Procedures:   Paracentesis  Antimicrobials:  Anti-infectives    Start     Dose/Rate Route Frequency Ordered Stop   10/27/15 2030  cefTRIAXone (ROCEPHIN) 1 g in dextrose 5 % 50 mL IVPB  Status:  Discontinued     1 g 100  mL/hr over 30 Minutes Intravenous Every 24 hours 10/27/15 2002 10/29/15 0742       Objective: Filed Vitals:   10/29/15 2115 10/30/15 0613 10/30/15 1300 10/30/15 1638  BP: 120/77 112/69 111/68   Pulse: 115 112 101   Temp: 98.9 F (37.2 C) 98.2 F (36.8 C) 98 F (36.7 C)   TempSrc: Oral Oral Oral   Resp: 17 17 18    Height:      Weight:    73 kg (160 lb 15 oz)  SpO2: 97% 97% 98%     Intake/Output Summary (Last 24 hours) at 10/30/15 1841 Last data filed at 10/30/15 1530  Gross per 24 hour  Intake    240 ml  Output   1725 ml  Net  -1485 ml   Filed Weights   10/28/15 0528 10/29/15 0500 10/30/15 1638  Weight: 69.2 kg (152 lb 8.9 oz) 70.9 kg (156 lb 4.9 oz) 73 kg (160 lb 15 oz)    Examination: General exam: Appears comfortable - severely jaundiced HEENT: PERRLA, oral mucosa moist,+++ sclera icterus or thrush Respiratory system: Clear to auscultation. Respiratory effort normal. Cardiovascular system: S1 & S2 heard, RRR.  No murmurs  Gastrointestinal system: Abdomen soft, non-tender, moderately distended. Normal bowel sound. No organomegaly Central nervous system: Alert and oriented. No focal neurological deficits. Extremities: No cyanosis, clubbing or edema Skin: No rashes or ulcers Psychiatry:  Mood & affect appropriate.     Data Reviewed: I have personally reviewed following labs and imaging studies  CBC:  Recent Labs Lab 10/27/15 1334 10/28/15 0519 10/29/15 0457 10/30/15  0918  WBC 7.9 6.9 6.8 11.3*  HGB 8.6* 7.6* 7.5* 7.8*  HCT 24.5* 21.8* 21.3* 22.7*  MCV 111.9* 113.5* 111.5* 111.3*  PLT 44* 43* 43* 67*   Basic Metabolic Panel:  Recent Labs Lab 10/27/15 1334 10/28/15 0519 10/29/15 0457 10/30/15 0918 10/30/15 1720  NA 136 135 133* 133* 130*  K 2.8* 3.0* 2.5* 2.4* 3.0*  CL 95* 97* 92* 91* 89*  CO2 27 31 33* 32 33*  GLUCOSE 113* 82 98 100* 160*  BUN 8 8 6 9 8   CREATININE <0.30* 0.33* 0.32* 0.34* 0.35*  CALCIUM 8.4* 8.0* 7.9* 7.8* 7.7*  MG  --    --  1.0* 1.0*  --    GFR: Estimated Creatinine Clearance: 86.1 mL/min (by C-G formula based on Cr of 0.35). Liver Function Tests:  Recent Labs Lab 10/27/15 1334 10/28/15 0519 10/29/15 0457  AST 220* 179* 179*  ALT 37 31 33  ALKPHOS 112 94 90  BILITOT 18.3* 17.8* 18.0*  PROT 7.6 6.7 6.6  ALBUMIN 2.4* 2.1* 2.1*    Recent Labs Lab 10/27/15 1334  LIPASE 51   No results for input(s): AMMONIA in the last 168 hours. Coagulation Profile:  Recent Labs Lab 10/27/15 1605 10/28/15 0519 10/29/15 0457  INR 1.86* 2.13* 2.12*   Cardiac Enzymes: No results for input(s): CKTOTAL, CKMB, CKMBINDEX, TROPONINI in the last 168 hours. BNP (last 3 results) No results for input(s): PROBNP in the last 8760 hours. HbA1C: No results for input(s): HGBA1C in the last 72 hours. CBG: No results for input(s): GLUCAP in the last 168 hours. Lipid Profile: No results for input(s): CHOL, HDL, LDLCALC, TRIG, CHOLHDL, LDLDIRECT in the last 72 hours. Thyroid Function Tests: No results for input(s): TSH, T4TOTAL, FREET4, T3FREE, THYROIDAB in the last 72 hours. Anemia Panel:  Recent Labs  10/28/15 0943  VITAMINB12 1767*  FOLATE 8.2  FERRITIN 1234*  TIBC NOT CALCULATED  IRON 80  RETICCTPCT 4.9*   Urine analysis:    Component Value Date/Time   COLORURINE AMBER* 10/27/2015 1421   APPEARANCEUR TURBID* 10/27/2015 1421   LABSPEC 1.024 10/27/2015 1421   PHURINE 5.5 10/27/2015 1421   GLUCOSEU NEGATIVE 10/27/2015 1421   HGBUR NEGATIVE 10/27/2015 1421   BILIRUBINUR LARGE* 10/27/2015 1421   KETONESUR 40* 10/27/2015 1421   PROTEINUR NEGATIVE 10/27/2015 1421   UROBILINOGEN 0.2 06/23/2009 1732   NITRITE POSITIVE* 10/27/2015 1421   LEUKOCYTESUR MODERATE* 10/27/2015 1421   Sepsis Labs: @LABRCNTIP (procalcitonin:4,lacticidven:4) ) Recent Results (from the past 240 hour(s))  Urine culture     Status: Abnormal   Collection Time: 10/27/15  2:21 PM  Result Value Ref Range Status   Specimen  Description URINE, RANDOM  Final   Special Requests NONE  Final   Culture (A)  Final    3,000 COLONIES/mL INSIGNIFICANT GROWTH Performed at [facility]    Report Status 10/28/2015 FINAL  Final  Culture, body fluid-bottle     Status: None (Preliminary result)   Collection Time: 10/28/15  2:29 PM  Result Value Ref Range Status   Specimen Description FLUID ASCITIC  Final   Special Requests NONE  Final   Culture   Final    NO GROWTH 2 DAYS Performed at [facility]    Report Status PENDING  Incomplete  Gram stain     Status: None   Collection Time: 10/28/15  2:29 PM  Result Value Ref Range Status   Specimen Description FLUID ASCITIC  Final   Special Requests NONE  Final  Gram Stain   Final    RARE WBC PRESENT, PREDOMINANTLY PMN NO ORGANISMS SEEN Performed at [facility]    Report Status 10/28/2015 FINAL  Final         Radiology Studies: No results found.    Scheduled Meds: . folic acid  1 mg Oral Daily  . pantoprazole  40 mg Oral Daily  . potassium chloride  10 mEq Intravenous Q1 Hr x 4  . potassium chloride  40 mEq Oral Q4H  . prednisoLONE  40 mg Oral Q breakfast  . sodium chloride flush  3 mL Intravenous Q12H  . thiamine  100 mg Oral Daily   Continuous Infusions:    LOS: 3 days    Time spent in minutes: 35    Darrel Baroni, MD [facility] Pager: [email or web address] Password [email or web address] 10/30/2015, 6:41 PM

## 2015-10-30 NOTE — Discharge Summary (Addendum)
Physician Discharge Summary  RICK WARNICK ZOX:[id number] DOB: [birth year] DOA: 10/27/2015  PCP: Neena Rhymes, MD  Admit date: 10/27/2015 Discharge date: 11/03/2015  Time spent: 55 minutes  Recommendations for Outpatient Follow-up:  1. F/u electrolytes and Hb periodically   Discharge Condition: stable    Discharge Diagnoses:  Principal Problem:   Alcoholic hepatitis/ Hepatic failure with ascites Active Problems:   EtOH dependence (HCC)   Hypokalemia   Cirrhosis (HCC)   Anemia of chronic disease   Elevated INR   Ascites   Thrombocytopenia (HCC)   Hypomagnesemia   Jaundice   History of present illness:  50 year old female presenting with abdominal distention, nausea, occasional vomiting and 2 weeks of jaundice. She states that she stopped drinking alcohol about 2 months ago. Her abdomen has distended suddenly over the past week. She has known alcoholic cirrhosis of the liver but her abdomen has never been distended before.  Hospital Course:  Active Problems:  Alcoholic hepatitis/ elevated LFTs/ Jaundice - acute alcoholic hepatitis/ hepatic failure with elevated Bilirubin and INR - have consulted GI- has been started on prednisolone for acute alcoholic hepatitis- goal is to taper off in 6 wks- she is to f/u with GI for this- she has an appt coming up at [facility] that was made prior to this admission  Ascites -New onset abdominal distention and pain -paracentesis done- 780 cc removed- non consistent with SBP -started lasix to prevent recurrence and explained not to drink water in excess which she tells me she has been doing to "flush" her system - advised to notify PCP is abdomen becomes distended at home - in negative balance by about 3 L after paracentesis was done - cont Lasix and Aldactone- f/u Bmet at PCPs office in few days to ensure electrolytes are stable  Hypokalemia/hypomagnesemia - replaced  Hyponatremia -follow closely while on diuretics    Thrombocytopenia, elevated INR, portal hypertension -Due to alcohol abuse  Anemia -Anemia panel reveals high ferritin level, normal folate, normal b12- Iron is 80- IBC and saturation not calculated -No need to transfuse - asymptomatic   Procedures:   paracentesis  Consultations:  GI  Discharge Exam: Filed Weights   10/29/15 0500 10/30/15 1638 10/31/15 0628  Weight: 70.9 kg (156 lb 4.9 oz) 73 kg (160 lb 15 oz) 73.2 kg (161 lb 6 oz)   Filed Vitals:   10/30/15 2148 10/31/15 0628  BP: 111/77 103/73  Pulse: 99 99  Temp: 98.5 F (36.9 C) 98.5 F (36.9 C)  Resp: 18 18    General: AAO x 3, no distress- severe icterus  Cardiovascular: RRR, no murmurs  Respiratory: clear to auscultation bilaterally GI: soft, non tender, mildly distended, bowel sound positive  Discharge Instructions You were cared for by a hospitalist during your hospital stay. If you have any questions about your discharge medications or the care you received while you were in the hospital after you are discharged, you can call the unit and asked to speak with the hospitalist on call if the hospitalist that took care of you is not available. Once you are discharged, your primary care physician will handle any further medical issues. Please note that NO REFILLS for any discharge medications will be authorized once you are discharged, as it is imperative that you return to your primary care physician (or establish a relationship with a primary care physician if you do not have one) for your aftercare needs so that they can reassess your need for medications and monitor your lab values.  Discharge Instructions    (HEART FAILURE PATIENTS) Call MD:  Anytime you have any of the following symptoms: 1) 3 pound weight gain in 24 hours or 5 pounds in 1 week 2) shortness of breath, with or without a dry hacking cough 3) swelling in the hands, feet or stomach 4) if you have to sleep on extra pillows at night in order to  breathe.    Complete by:  As directed      Diet - low sodium heart healthy    Complete by:  As directed      Diet - low sodium heart healthy    Complete by:  As directed      Discharge instructions    Complete by:  As directed   Weight daily You need a Bmet and Magnesium in 1 wk.     Increase activity slowly    Complete by:  As directed      Increase activity slowly    Complete by:  As directed             Medication List    STOP taking these medications        cefUROXime 250 MG tablet  Commonly known as:  CEFTIN     promethazine-dextromethorphan 6.25-15 MG/5ML syrup  Commonly known as:  PROMETHAZINE-DM      TAKE these medications        b complex vitamins tablet  Take 1 tablet by mouth 2 (two) times a week. Reported on 11/02/2015     BIOTIN PO  Take 10,000 mcg by mouth daily. Reported on 11/02/2015     calcium carbonate 600 MG Tabs tablet  Commonly known as:  OS-CAL  Take 600 mg by mouth daily. Reported on 11/02/2015     cyclobenzaprine 5 MG tablet  Commonly known as:  FLEXERIL  Take 1 tablet (5 mg total) by mouth at bedtime.     DAILY MULTIVITAMIN PO  Take 1 tablet by mouth daily.     EPINEPHrine 0.3 mg/0.3 mL Soaj injection  Commonly known as:  EPI-PEN  Inject 0.3 mg into the muscle as needed. Reported on 11/02/2015     fexofenadine 180 MG tablet  Commonly known as:  ALLEGRA  Take 180 mg by mouth as needed for allergies. Reported on 11/02/2015     fluticasone 50 MCG/ACT nasal spray  Commonly known as:  FLONASE  Place 2 sprays into both nostrils daily as needed for allergies. Reported on 11/02/2015     folic acid 800 MCG tablet  Commonly known as:  FOLVITE  Take 800 mcg by mouth daily.     furosemide 20 MG tablet  Commonly known as:  LASIX  Take 2 tablets (40 mg total) by mouth daily.     GILDESS FE 1/20 1-20 MG-MCG tablet  Generic drug:  norethindrone-ethinyl estradiol  Take 1 tablet by mouth daily. Reported on 11/02/2015     hydrOXYzine 25 MG  capsule  Commonly known as:  VISTARIL  1 tab Q4-6 prn and nightly for sleep     ibuprofen 200 MG tablet  Commonly known as:  ADVIL,MOTRIN  Take 200 mg by mouth every 6 (six) hours as needed for moderate pain. Reported on 11/02/2015     potassium chloride SA 20 MEQ tablet  Commonly known as:  K-DUR,KLOR-CON  Take 1 tablet (20 mEq total) by mouth daily.     prednisoLONE 5 MG Tabs tablet  Take 8 tablets (40 mg total) by mouth daily with breakfast.  sertraline 100 MG tablet  Commonly known as:  ZOLOFT  Take 1 tablet (100 mg total) by mouth every evening.     spironolactone 50 MG tablet  Commonly known as:  ALDACTONE  Take 1 tablet (50 mg total) by mouth daily.     traMADol 50 MG tablet  Commonly known as:  ULTRAM  Take 1 tablet (50 mg total) by mouth every 12 (twelve) hours as needed.     vitamin E 400 UNIT capsule  Take 400 Units by mouth daily. Reported on 11/02/2015       Allergies  Allergen Reactions  . Avelox [Moxifloxacin Hcl In Nacl] Shortness Of Breath, Itching and Rash    GI Upset  . Bee Venom Anaphylaxis  . Doxycycline Nausea Only and Rash  . Amoxicillin Rash    Upset stomach   . Erythromycin Rash    Upset stomach  . Sulfa Antibiotics Rash    Upset stomach   Follow-up Information    Follow up with Neena Rhymes, MD.   Specialty:  Family Medicine   Why:  needs Bmet and magnesium levels on Tues and again on Thurs   Contact information:   [address] [place] [address] [phone]        The results of significant diagnostics from this hospitalization (including imaging, microbiology, ancillary and laboratory) are listed below for reference.    Significant Diagnostic Studies: Ct Abdomen Pelvis W Contrast  10/27/2015  CLINICAL DATA:  50 year old with low abdominal pain, nausea and vomiting for 3 days. Abdominal distention. History of alcoholic liver disease. EXAM: CT ABDOMEN AND PELVIS WITH CONTRAST TECHNIQUE: Multidetector CT imaging of  the abdomen and pelvis was performed using the standard protocol following bolus administration of intravenous contrast. CONTRAST:  ISOVUE-300 IOPAMIDOL (ISOVUE-300) INJECTION 61% COMPARISON:  None. FINDINGS: Lower chest: There is a dependent small to moderate right-sided pleural effusion. There is trace pleural fluid on the left. There is mild atelectasis dependently at both lung bases. No significant pericardial effusion. Hepatobiliary: The liver is diffusely abnormal with innumerable low-density lesions throughout all segments. The liver is enlarged, measuring 22.2 x 12.0 x 17.9 cm. Its contours are mildly irregular. No dominant hypervascular lesions are seen. The gallbladder is distended with mild wall thickening. There are small calcified gallstones and/or calcifications within the gallbladder wall. No significant biliary dilatation. Pancreas: Unremarkable. No pancreatic ductal dilatation or surrounding inflammatory changes. Spleen: Splenic size is at the upper limits of normal. The spleen measures 13.9 x 12.7 x 7.1 cm for an estimated volume of 627 ml. No focal splenic lesions are identified. Adrenals/Urinary Tract: Both adrenal glands appear normal. The kidneys appear normal without evidence of urinary tract calculus, suspicious lesion or hydronephrosis. No bladder abnormalities are seen. Stomach/Bowel: There is a small hiatal hernia. The stomach and small bowel appear unremarkable. There is mild nonspecific right colonic wall thickening which appears nonfocal. The appendix is not clearly seen, although may extend posteriorly from the cecum into ascites. There is no evidence of pericecal inflammation. Vascular/Lymphatic: There are mildly prominent lymph nodes in the porta hepatis, likely related to chronic liver disease. No retroperitoneal lymphadenopathy. No significant arterial abnormalities identified. The portal, superior mesenteric and splenic veins are patent. There are several retroperitoneal  venous collaterals on the left. Reproductive: The uterus appears normal. No evidence of adnexal mass. Other: Moderate ascites, greatest volume in the pelvis. No peritoneal nodularity identified. Musculoskeletal: No acute or significant osseous findings. Degenerative disc disease noted at L5-S1. IMPRESSION:  1. Distended gallbladder with wall thickening, cholelithiasis and/or gallbladder wall calcification. In light of the patient's liver disease and ascites, these findings are not entirely specific. If cholecystitis is a clinical concern, correlation with ultrasound may be helpful. 2. Chronic liver disease with hepatomegaly, diffuse contour irregularity and innumerable low-density liver lesions, probably representing cirrhosis with regenerating nodules. No focal hypervascular lesions identified on routine imaging. 3. Ascites and retroperitoneal collaterals on the left suspicious for early portal hypertension. Splenic size is at the upper limits of normal. 4. Nonspecific proximal colonic wall thickening, likely related to liver disease and ascites. No evidence of bowel obstruction. 5. Right-greater-than-left pleural effusions with associated bibasilar atelectasis. Electronically Signed   By: Carey BullocksWilliam  Veazey M.D.   On: 10/27/2015 16:58   [place] Paracentesis  10/28/2015  INDICATION: Abdominal distention secondary to ascites. Cirrhosis. Request diagnostic and therapeutic paracentesis. EXAM: ULTRASOUND GUIDED RIGHT LOWER QUADRANT PARACENTESIS MEDICATIONS: None. COMPLICATIONS: None immediate. PROCEDURE: Informed written consent was obtained from the patient after a discussion of the risks, benefits and alternatives to treatment. A timeout was performed prior to the initiation of the procedure. Initial ultrasound scanning demonstrates a large amount of ascites within the right lower abdominal quadrant. The right lower abdomen was prepped and draped in the usual sterile fashion. 1% lidocaine with epinephrine was used for  local anesthesia. Following this, a 7 cm, 19 gauge, Yueh catheter was introduced. An ultrasound image was saved for documentation purposes. The paracentesis was performed. The catheter was removed and a dressing was applied. The patient tolerated the procedure well without immediate post procedural complication. FINDINGS: A total of approximately 780 mL of clear yellow fluid was removed. Samples were sent to the laboratory as requested by the clinical team. IMPRESSION: Successful ultrasound-guided paracentesis yielding 780 mL liters of peritoneal fluid. Read by: Brayton ElKevin Bruning PA-C Electronically Signed   By: Corlis Leak  Hassell M.D.   On: 10/28/2015 11:29    Microbiology: Recent Results (from the past 240 hour(s))  Urine culture     Status: Abnormal   Collection Time: 10/27/15  2:21 PM  Result Value Ref Range Status   Specimen Description URINE, RANDOM  Final   Special Requests NONE  Final   Culture (A)  Final    3,000 COLONIES/mL INSIGNIFICANT GROWTH Performed at [facility]    Report Status 10/28/2015 FINAL  Final  Culture, body fluid-bottle     Status: None   Collection Time: 10/28/15  2:29 PM  Result Value Ref Range Status   Specimen Description FLUID ASCITIC  Final   Special Requests NONE  Final   Culture   Final    NO GROWTH 5 DAYS Performed at [facility]    Report Status 11/02/2015 FINAL  Final  Gram stain     Status: None   Collection Time: 10/28/15  2:29 PM  Result Value Ref Range Status   Specimen Description FLUID ASCITIC  Final   Special Requests NONE  Final   Gram Stain   Final    RARE WBC PRESENT, PREDOMINANTLY PMN NO ORGANISMS SEEN Performed at [facility]    Report Status 10/28/2015 FINAL  Final     Labs: Basic Metabolic Panel:  Recent Labs Lab 10/29/15 0457 10/30/15 0918 10/30/15 1720 10/31/15 0800 11/02/15 1152  NA 133* 133* 130* 132* 130*  K 2.5* 2.4* 3.0* 3.6 3.8  CL 92* 91* 89* 93* 93*  CO2 33* 32 33* 31 31  GLUCOSE 98 100* 160*  86 137*  BUN 6 9  8 11 14   CREATININE 0.32* 0.34* 0.35* <0.30* 0.69  CALCIUM 7.9* 7.8* 7.7* 8.3* 9.1  MG 1.0* 1.0*  --  1.7 1.6   Liver Function Tests:  Recent Labs Lab 10/27/15 1334 10/28/15 0519 10/29/15 0457 11/02/15 1152  AST 220* 179* 179* 157*  ALT 37 31 33 47*  ALKPHOS 112 94 90 137*  BILITOT 18.3* 17.8* 18.0* 18.1*  PROT 7.6 6.7 6.6 7.4  ALBUMIN 2.4* 2.1* 2.1* 3.1*    Recent Labs Lab 10/27/15 1334  LIPASE 51   No results for input(s): AMMONIA in the last 168 hours. CBC:  Recent Labs Lab 10/27/15 1334 10/28/15 0519 10/29/15 0457 10/30/15 0918 11/02/15 1152  WBC 7.9 6.9 6.8 11.3* 10.4  NEUTROABS  --   --   --   --  8.7*  HGB 8.6* 7.6* 7.5* 7.8* 9.1*  HCT 24.5* 21.8* 21.3* 22.7* 26.8 Repeated and verified X2.*  MCV 111.9* 113.5* 111.5* 111.3* 119.0 Repeated and verified X2.*  PLT 44* 43* 43* 67* 122.0*   Cardiac Enzymes: No results for input(s): CKTOTAL, CKMB, CKMBINDEX, TROPONINI in the last 168 hours. BNP: BNP (last 3 results) No results for input(s): BNP in the last 8760 hours.  ProBNP (last 3 results) No results for input(s): PROBNP in the last 8760 hours.  CBG: No results for input(s): GLUCAP in the last 168 hours.     SignedCalvert Cantor, MD [facility] 11/03/2015, 12:38 PM

## 2015-10-31 LAB — BASIC METABOLIC PANEL
Anion gap: 8 (ref 5–15)
BUN: 11 mg/dL (ref 6–20)
CALCIUM: 8.3 mg/dL — AB (ref 8.9–10.3)
CO2: 31 mmol/L (ref 22–32)
Chloride: 93 mmol/L — ABNORMAL LOW (ref 101–111)
Glucose, Bld: 86 mg/dL (ref 65–99)
Potassium: 3.6 mmol/L (ref 3.5–5.1)
SODIUM: 132 mmol/L — AB (ref 135–145)

## 2015-10-31 LAB — MAGNESIUM: MAGNESIUM: 1.7 mg/dL (ref 1.7–2.4)

## 2015-10-31 MED ORDER — MAGNESIUM SULFATE 2 GM/50ML IV SOLN
2.0000 g | Freq: Once | INTRAVENOUS | Status: DC
  Filled 2015-10-31: qty 50

## 2015-10-31 MED ORDER — SPIRONOLACTONE 50 MG PO TABS
50.0000 mg | ORAL_TABLET | Freq: Every day | ORAL | Status: AC
Start: 2015-10-31 — End: ?

## 2015-10-31 MED ORDER — POTASSIUM CHLORIDE CRYS ER 20 MEQ PO TBCR
40.0000 meq | EXTENDED_RELEASE_TABLET | ORAL | Status: AC
  Administered 2015-10-31 (×2): 40 meq via ORAL
  Filled 2015-10-31 (×3): qty 2

## 2015-10-31 MED ORDER — POTASSIUM CHLORIDE 10 MEQ/100ML IV SOLN
10.0000 meq | INTRAVENOUS | Status: AC
  Filled 2015-10-31 (×4): qty 100

## 2015-10-31 MED ORDER — SPIRONOLACTONE 50 MG PO TABS
50.0000 mg | ORAL_TABLET | Freq: Every day | ORAL | Status: DC
  Administered 2015-10-31: 50 mg via ORAL
  Filled 2015-10-31: qty 1

## 2015-10-31 NOTE — Progress Notes (Signed)
Patient's husband called in because the pharmacy gave them prednisone instead of prednisolone and he believed he had been told she should not take prednisone. Called the pharmacy and prednisolone is on back order and unable to be obtained. Called the MD and she said it was fine for the patient to take the prednisone. Called patient back and informed her that the MD had said she could take the prednisone since prednisolone was not available.

## 2015-11-01 ENCOUNTER — Telehealth: Payer: Self-pay | Admitting: Family Medicine

## 2015-11-01 NOTE — Telephone Encounter (Addendum)
Transition Care Management Follow-up Telephone Call  Per Discharge AVS: needs Bmet and magnesium levels on Tues and again on Thurs   Date discharged? 10/31/15   How have you been since you were released from the hospital? "Well they're trying to get all my levels equaled out and that's why she wants me to have blood work Tues/Thurs because we're supposed to be going [place], so we're trying to get me regulated. I feel much better. I've been sick on and off for a couple of months now. I haven't been eating regularly. My 50 year old has been driving me crazy. He doesn't live here, but all it takes is a phone call and he's a screamer. I just let it all pile up and get to me. I had a lot of things happen and I lost 10 lbs in 10-12 days and I woke up Thursday w/ my belly swollen so tight I looked like I was about to have a baby so when they drew fluid off my belly they drew 780 mL. Then the next day it was starting to swell again so they put me on Lasix through the IV and then my potassium went down so now I'm learning how to equal that out."   Do you understand why you were in the hospital? yes   Do you understand the discharge instructions? yes   Where were you discharged to? Home   Items Reviewed:  Medications reviewed: yes  Allergies reviewed: yes  Dietary changes reviewed: yes, low sodium diet per pt  Referrals reviewed: no, none made   Functional Questionnaire:   Activities of Daily Living (ADLs):   She states they are independent in the following: ambulation, bathing and hygiene, feeding, continence, grooming, toileting and dressing States they require assistance with the following: None   Any transportation issues/concerns?: no   Any patient concerns? yes, pt states she needs refill of tramadol w/ instructions stating to take q12h PRN. States hospitalist did not actually refill medication, but gave her paperwork showing new sig. She will bring  paperwork to appointment. Pt c/o reflux, states in the hospital she was given medication for this and is wondering if she needs something rx or OTC for symptoms. She is taking prednisone and needs to be advised on how to taper.   Confirmed importance and date/time of follow-up visits scheduled yes  Provider Appointment booked with Dr. Neena RhymesKatherine Tabori 11/02/15 @ 11am  Confirmed with patient if condition begins to worsen call PCP or go to the ER.  Patient was given the office number and encouraged to call back with question or concerns.  : yes

## 2015-11-01 NOTE — Telephone Encounter (Signed)
Pt states that she was in hosp and Dr states that she needs labs drawn on Tues & Thurs of this week and asking if she could have this done at [facility] location. Pt has scheduled an Hosp f/u for this Friday with Dr Beverely Lowabori.

## 2015-11-01 NOTE — Addendum Note (Signed)
Addended by: Starla LinkAKLEY, Able Malloy J on: 11/01/2015 10:01 AM   Modules accepted: Medications

## 2015-11-01 NOTE — Telephone Encounter (Signed)
Per Hospital note pt needs BMET in 1 week. We can just draw labs on Friday since she was discharged on 6/17 correct?

## 2015-11-01 NOTE — Telephone Encounter (Signed)
Noted and pt informed

## 2015-11-01 NOTE — Telephone Encounter (Signed)
Per hospital d/c summary she can have everything done at the time of her visit on Friday

## 2015-11-02 ENCOUNTER — Encounter: Payer: Self-pay | Admitting: Family Medicine

## 2015-11-02 ENCOUNTER — Ambulatory Visit (INDEPENDENT_AMBULATORY_CARE_PROVIDER_SITE_OTHER): Payer: 59 | Admitting: Family Medicine

## 2015-11-02 VITALS — BP 136/82 | HR 104 | Temp 98.6°F | Resp 17 | Ht 66.0 in | Wt 147.2 lb

## 2015-11-02 DIAGNOSIS — D696 Thrombocytopenia, unspecified: Secondary | ICD-10-CM

## 2015-11-02 DIAGNOSIS — Z5189 Encounter for other specified aftercare: Secondary | ICD-10-CM | POA: Diagnosis not present

## 2015-11-02 DIAGNOSIS — E876 Hypokalemia: Secondary | ICD-10-CM

## 2015-11-02 DIAGNOSIS — K7040 Alcoholic hepatic failure without coma: Secondary | ICD-10-CM | POA: Diagnosis not present

## 2015-11-02 LAB — CBC WITH DIFFERENTIAL/PLATELET
BASOS ABS: 0 10*3/uL (ref 0.0–0.1)
BASOS PCT: 0 % (ref 0.0–3.0)
EOS ABS: 0 10*3/uL (ref 0.0–0.7)
Eosinophils Relative: 0.3 % (ref 0.0–5.0)
HCT: 26.8 % — ABNORMAL LOW (ref 36.0–46.0)
HEMOGLOBIN: 9.1 g/dL — AB (ref 12.0–15.0)
Lymphocytes Relative: 7.7 % — ABNORMAL LOW (ref 12.0–46.0)
Lymphs Abs: 0.8 10*3/uL (ref 0.7–4.0)
MCHC: 34 g/dL (ref 30.0–36.0)
MCV: 119 fl — ABNORMAL HIGH (ref 78.0–100.0)
MONO ABS: 0.9 10*3/uL (ref 0.1–1.0)
Monocytes Relative: 8.4 % (ref 3.0–12.0)
NEUTROS ABS: 8.7 10*3/uL — AB (ref 1.4–7.7)
Neutrophils Relative %: 83.6 % — ABNORMAL HIGH (ref 43.0–77.0)
PLATELETS: 122 10*3/uL — AB (ref 150.0–400.0)
RBC: 2.26 Mil/uL — ABNORMAL LOW (ref 3.87–5.11)
RDW: 17.9 % — AB (ref 11.5–15.5)
WBC: 10.4 10*3/uL (ref 4.0–10.5)

## 2015-11-02 LAB — CULTURE, BODY FLUID W GRAM STAIN -BOTTLE

## 2015-11-02 LAB — BASIC METABOLIC PANEL
BUN: 14 mg/dL (ref 6–23)
CHLORIDE: 93 meq/L — AB (ref 96–112)
CO2: 31 meq/L (ref 19–32)
CREATININE: 0.69 mg/dL (ref 0.40–1.20)
Calcium: 9.1 mg/dL (ref 8.4–10.5)
GFR: 95.55 mL/min (ref >60.00)
GLUCOSE: 137 mg/dL — AB (ref 70–99)
Potassium: 3.8 mEq/L (ref 3.5–5.1)
Sodium: 130 mEq/L — ABNORMAL LOW (ref 135–145)

## 2015-11-02 LAB — HEPATIC FUNCTION PANEL
ALBUMIN: 3.1 g/dL — AB (ref 3.5–5.2)
ALK PHOS: 137 U/L — AB (ref 39–117)
ALT: 47 U/L — AB (ref 0–35)
AST: 157 U/L — ABNORMAL HIGH (ref 0–37)
BILIRUBIN DIRECT: 8.7 mg/dL — AB (ref 0.0–0.3)
TOTAL PROTEIN: 7.4 g/dL (ref 6.0–8.3)
Total Bilirubin: 18.1 mg/dL — ABNORMAL HIGH (ref 0.2–1.2)

## 2015-11-02 LAB — MAGNESIUM: MAGNESIUM: 1.6 mg/dL (ref 1.5–2.5)

## 2015-11-02 LAB — CULTURE, BODY FLUID-BOTTLE: CULTURE: NO GROWTH

## 2015-11-02 NOTE — Progress Notes (Signed)
   Subjective:    Patient ID: Kristina Morris, female    DOB: 1965-10-14, 50 y.o.   MRN: [id number]  HPI Hospital f/u- pt was admitted 6/14-17 w/ acute alcoholic hepatitis w/ liver failure.  Daughter reported jaundice for ~1 month prior to admission.  She had paracentesis for her ascites and 780ml removed.  No evidence of SBP.  GI recommended steroids for acute hepatitis.  Noted to have thrombocytopenia, hypokalemia, hypomagnesemia.  Bili was 18.  Pt states she is not eating b/c she is depressed and stressed about contentious relationship w/ son.  Pt admits to recurrent drinking but denies drinking last week at time of hospitalization despite + ETOH level.  Pt says that she has only drank episodically a 'few times' since Christmas.  Pt has appt w/ Dr Veto Kempsudd [facility] on 7/9.  Denies abd pain, N/V.  Had BM last night.  Denies CP, SOB.   Review of Systems For ROS see HPI     Objective:   Physical Exam  Constitutional: She is oriented to person, place, and time. She appears well-developed and well-nourished. No distress.  HENT:  Head: Normocephalic and atraumatic.  Eyes: EOM are normal. Pupils are equal, round, and reactive to light.  jaundiced  Cardiovascular: Normal heart sounds and intact distal pulses.   No murmur heard. Tachy but regular  Abdominal: Soft. Bowel sounds are normal. She exhibits distension (mild). There is no tenderness. There is no rebound and no guarding.  Musculoskeletal: She exhibits no edema.  Neurological: She is alert and oriented to person, place, and time.  Skin: Skin is warm and dry.  Marked jaundice of face and eyes  Vitals reviewed.         Assessment & Plan:

## 2015-11-02 NOTE — Assessment & Plan Note (Signed)
New.  Pt had difficulty maintaining her K+ levels while hospitalized.  Now taking supplementation and attempting to follow a high K+, low Na diet.  Check labs.  Replete prn.

## 2015-11-02 NOTE — Patient Instructions (Addendum)
Follow up w/ [facility] as scheduled and determine if and when you need to wean off the prednisone We'll notify you of your lab results and make any changes if needed Do not drink alcohol- your condition is quite serious Continue the Lasix as directed Call with any questions or concerns Hang in there!!!

## 2015-11-02 NOTE — Assessment & Plan Note (Signed)
Noted while hospitalized.  Repeat labs today and replete prn.

## 2015-11-02 NOTE — Assessment & Plan Note (Signed)
New to provider.  Again stressed need to refrain from all alcohol as pt's condition is quite serious.  She has f/u w/ GI at [facility] 7[facility]9 but is interested in establishing care locally so that she doesn't have to drive to [facility].  Currently on Prednisone 40mg  daily- will defer to GI to determine when it is appropriate to wean this.  Pt is markedly jaundiced today but denies hepatic sxs- itching, confusion.  Will continue to follow.

## 2015-11-02 NOTE — Progress Notes (Signed)
Pre visit review using our clinic review tool, if applicable. No additional management support is needed unless otherwise documented below in the visit note. 

## 2015-11-02 NOTE — Assessment & Plan Note (Signed)
New.  Due to her chronic alcoholic liver disease.  Repeat labs today and monitor closely.  May need heme referral in the future if counts continue to drop.

## 2015-11-03 DIAGNOSIS — K7011 Alcoholic hepatitis with ascites: Secondary | ICD-10-CM

## 2015-11-03 DIAGNOSIS — R17 Unspecified jaundice: Secondary | ICD-10-CM

## 2015-11-04 ENCOUNTER — Other Ambulatory Visit: Payer: Self-pay | Admitting: General Practice

## 2015-11-04 ENCOUNTER — Telehealth: Payer: Self-pay | Admitting: Gastroenterology

## 2015-11-04 ENCOUNTER — Other Ambulatory Visit (INDEPENDENT_AMBULATORY_CARE_PROVIDER_SITE_OTHER): Payer: 59

## 2015-11-04 ENCOUNTER — Telehealth: Payer: Self-pay | Admitting: Family Medicine

## 2015-11-04 ENCOUNTER — Other Ambulatory Visit: Payer: Self-pay | Admitting: Family Medicine

## 2015-11-04 DIAGNOSIS — R7989 Other specified abnormal findings of blood chemistry: Secondary | ICD-10-CM

## 2015-11-04 LAB — BASIC METABOLIC PANEL
BUN: 14 mg/dL (ref 6–23)
CALCIUM: 9.2 mg/dL (ref 8.4–10.5)
CO2: 35 mEq/L — ABNORMAL HIGH (ref 19–32)
CREATININE: 0.63 mg/dL (ref 0.40–1.20)
Chloride: 91 mEq/L — ABNORMAL LOW (ref 96–112)
GFR: 106.13 mL/min (ref >60.00)
Glucose, Bld: 116 mg/dL — ABNORMAL HIGH (ref 70–99)
Potassium: 3.6 mEq/L (ref 3.5–5.1)
SODIUM: 128 meq/L — AB (ref 135–145)

## 2015-11-04 MED ORDER — OMEPRAZOLE 40 MG PO CPDR: 40.0000 mg | DELAYED_RELEASE_CAPSULE | Freq: Every day | ORAL | Status: DC

## 2015-11-04 MED ORDER — TRAMADOL HCL 50 MG PO TABS: 50.0000 mg | ORAL_TABLET | Freq: Two times a day (BID) | ORAL | Status: DC | PRN

## 2015-11-04 NOTE — Telephone Encounter (Signed)
Please call pharmacy and verify that this was not filled.  If not filled, we can fill #[phone], use BID prn.  No refills.

## 2015-11-04 NOTE — Telephone Encounter (Signed)
Spoke with pt today and gave her lab results. Pt advised that she went to the hospital to have provider sign her tramadol prescription and she is off work this week. Pt wants to know if PCP will sign a tramadol rx for her because she uses it for her migraines. I advised pt that she may have to drive out here to pick up prescription so that we can verify old one was not filled and she stated that she does not have time to come all the way out here. Please advise?

## 2015-11-04 NOTE — Telephone Encounter (Signed)
Ok for pt to have BMP done at [facility] today or tomorrow due to low Na She just got a prescription for Tramadol on 6/17- no refills at this time [facility] for Om[facility]razole 20mg  daily for heart burn, #30, 3 refills

## 2015-11-04 NOTE — Telephone Encounter (Signed)
Spoke with pt and advised that omeprazole was filled to local pharmacy and labs were ordered. Pt "found" the rx for tramadol while we were on the phone and stated it was not signed. I advised pt that she need to go back and see if someone at the hospital could sign it for her since they were the ones who printed it. Pt stated an understanding.

## 2015-11-04 NOTE — Telephone Encounter (Signed)
Pharmacy verified this has not bee filled.

## 2015-11-04 NOTE — Telephone Encounter (Signed)
Patient calling to request the following:   1.  Needs clarification on if she needs to repeat lab work that was done on Tuesday, if so she needs to come in today or tomorrow before she goes out of town.  2.  Refill of Tramadol, she states pcp forgot to give it to her.  Patient states she takes 1 tablet every 12 hours.  3.  Didn't get to tell pcp about heartburn she has been experiencing, she was given something for reflux intermittently while in the hospital.  She needs to know if she should be taking something regularly.  If so, [facility] [address]

## 2015-11-04 NOTE — Telephone Encounter (Signed)
Patient states that she has an appointment at [facility] on 11/19/15 and she is wanting to keep that appt. She states that she wants to be seen by our office after that because our office is closer to her. I informed her that I could not schedule an appointment until after she is seen and her records would have to be reviewed by our doctors before I can schedule an appointment(because she has seen GI elsewhere within the past year). Patient became very rude because I would not schedule her an appointment. I spoke to Dr. Beverely Lowabori and she states that she is fine that appointment cannot be scheduled until after patient is seen at [facility] and understands that our doctors will need to review patient's records before an appointment can be scheduled.

## 2015-11-05 ENCOUNTER — Ambulatory Visit: Payer: 59 | Admitting: Family Medicine

## 2015-11-05 ENCOUNTER — Telehealth: Payer: Self-pay | Admitting: Family Medicine

## 2015-11-05 MED ORDER — PREDNISONE 20 MG PO TABS: 20.0000 mg | ORAL_TABLET | Freq: Every day | ORAL | Status: DC

## 2015-11-05 NOTE — Telephone Encounter (Signed)
Please advise 

## 2015-11-05 NOTE — Telephone Encounter (Signed)
Pt states that she will not have enough prednisone to get her through until her next visit and asking for a refill, [facility] on [address]

## 2015-11-05 NOTE — Telephone Encounter (Signed)
Ok to refill prednisone but if they want her on 40mg  daily (5mg , 8 tabs daily) we will simplify the dosing and switch to 20mg  tabs, 2 pills daily, #60.  She is to take until she sees GI at [facility] and then discuss this w/ them.

## 2015-11-05 NOTE — Telephone Encounter (Signed)
Spoke with the pt and she was advised of new dosing directions and expressed an understanding.

## 2015-11-05 NOTE — Telephone Encounter (Signed)
Med filled to local pharmacy and pt called and detailed message left for patient to advise of the new dosing instructions.. Will try reaching the pt again later to discuss.

## 2015-12-06 ENCOUNTER — Other Ambulatory Visit: Payer: Self-pay | Admitting: Family Medicine

## 2015-12-14 ENCOUNTER — Other Ambulatory Visit: Payer: Self-pay | Admitting: Neurology

## 2015-12-14 ENCOUNTER — Other Ambulatory Visit: Payer: Self-pay | Admitting: Family Medicine

## 2015-12-14 DIAGNOSIS — M4802 Spinal stenosis, cervical region: Secondary | ICD-10-CM

## 2015-12-14 DIAGNOSIS — G4486 Cervicogenic headache: Secondary | ICD-10-CM

## 2015-12-14 DIAGNOSIS — R51 Headache: Secondary | ICD-10-CM

## 2015-12-15 NOTE — Telephone Encounter (Signed)
Last ov 11/02/15 Tramadol last filled 11/04/15 #30 with 0 Flexeril last filled 12/24/14 #30 with 5

## 2015-12-27 ENCOUNTER — Ambulatory Visit (INDEPENDENT_AMBULATORY_CARE_PROVIDER_SITE_OTHER): Payer: 59 | Admitting: Family

## 2015-12-27 ENCOUNTER — Encounter: Payer: Self-pay | Admitting: Family

## 2015-12-27 ENCOUNTER — Telehealth: Payer: Self-pay | Admitting: *Deleted

## 2015-12-27 ENCOUNTER — Ambulatory Visit (HOSPITAL_BASED_OUTPATIENT_CLINIC_OR_DEPARTMENT_OTHER)
Admission: RE | Admit: 2015-12-27 | Discharge: 2015-12-27 | Disposition: A | Discharge status: Home / Self Care | Payer: 59 | Source: Ambulatory Visit | Attending: Family | Admitting: Family

## 2015-12-27 ENCOUNTER — Telehealth: Payer: Self-pay | Admitting: Family

## 2015-12-27 VITALS — BP 110/60 | HR 122 | Temp 98.1°F | Resp 16 | Ht 66.0 in | Wt 132.1 lb

## 2015-12-27 DIAGNOSIS — M5136 Other intervertebral disc degeneration, lumbar region: Secondary | ICD-10-CM | POA: Diagnosis not present

## 2015-12-27 DIAGNOSIS — M545 Low back pain, unspecified: Secondary | ICD-10-CM

## 2015-12-27 DIAGNOSIS — T1490 Injury, unspecified: Secondary | ICD-10-CM | POA: Insufficient documentation

## 2015-12-27 DIAGNOSIS — K7031 Alcoholic cirrhosis of liver with ascites: Secondary | ICD-10-CM | POA: Diagnosis not present

## 2015-12-27 DIAGNOSIS — M5137 Other intervertebral disc degeneration, lumbosacral region: Secondary | ICD-10-CM | POA: Insufficient documentation

## 2015-12-27 DIAGNOSIS — W1809XA Striking against other object with subsequent fall, initial encounter: Secondary | ICD-10-CM | POA: Diagnosis not present

## 2015-12-27 DIAGNOSIS — E875 Hyperkalemia: Secondary | ICD-10-CM

## 2015-12-27 DIAGNOSIS — D72829 Elevated white blood cell count, unspecified: Secondary | ICD-10-CM

## 2015-12-27 DIAGNOSIS — IMO0002 Reserved for concepts with insufficient information to code with codable children: Secondary | ICD-10-CM

## 2015-12-27 LAB — CBC WITH DIFFERENTIAL/PLATELET
BASOS ABS: 0.1 10*3/uL (ref 0.0–0.1)
Basophils Relative: 0.3 % (ref 0.0–3.0)
EOS ABS: 0.1 10*3/uL (ref 0.0–0.7)
Eosinophils Relative: 0.3 % (ref 0.0–5.0)
HEMATOCRIT: 36.7 % (ref 36.0–46.0)
HEMOGLOBIN: 12.5 g/dL (ref 12.0–15.0)
LYMPHS PCT: 19.5 % (ref 12.0–46.0)
Lymphs Abs: 4 10*3/uL (ref 0.7–4.0)
MCHC: 34.2 g/dL (ref 30.0–36.0)
MCV: 104.3 fl — AB (ref 78.0–100.0)
MONOS PCT: 8.8 % (ref 3.0–12.0)
Monocytes Absolute: 1.8 10*3/uL — ABNORMAL HIGH (ref 0.1–1.0)
Neutro Abs: 14.5 10*3/uL — ABNORMAL HIGH (ref 1.4–7.7)
Neutrophils Relative %: 71.1 % (ref 43.0–77.0)
Platelets: 103 10*3/uL — ABNORMAL LOW (ref 150.0–400.0)
RBC: 3.51 Mil/uL — AB (ref 3.87–5.11)
RDW: 17.5 % — ABNORMAL HIGH (ref 11.5–15.5)

## 2015-12-27 LAB — COMPREHENSIVE METABOLIC PANEL
ALT: 66 U/L — AB (ref 0–35)
AST: 79 U/L — AB (ref 0–37)
Albumin: 3.5 g/dL (ref 3.5–5.2)
Alkaline Phosphatase: 215 U/L — ABNORMAL HIGH (ref 39–117)
BILIRUBIN TOTAL: 10 mg/dL — AB (ref 0.2–1.2)
BUN: 49 mg/dL — AB (ref 6–23)
CALCIUM: 9.8 mg/dL (ref 8.4–10.5)
CO2: 25 mEq/L (ref 19–32)
CREATININE: 1.6 mg/dL — AB (ref 0.40–1.20)
Chloride: 91 mEq/L — ABNORMAL LOW (ref 96–112)
GFR: 36.18 mL/min — AB (ref >60.00)
Glucose, Bld: 90 mg/dL (ref 70–99)
Potassium: 5.8 mEq/L — ABNORMAL HIGH (ref 3.5–5.1)
Sodium: 125 mEq/L — ABNORMAL LOW (ref 135–145)
TOTAL PROTEIN: 7.2 g/dL (ref 6.0–8.3)

## 2015-12-27 LAB — AMMONIA: AMMONIA: 66 umol/L — AB (ref 11–35)

## 2015-12-27 MED ORDER — SODIUM POLYSTYRENE SULFONATE 15 GM/60ML PO SUSP: 30.0000 g | Freq: Once | ORAL | 0 refills | Status: AC

## 2015-12-27 MED ORDER — TRAMADOL HCL 50 MG PO TABS: ORAL_TABLET | ORAL | 0 refills | Status: AC

## 2015-12-27 NOTE — Progress Notes (Signed)
Pre visit review using our clinic review tool, if applicable. No additional management support is needed unless otherwise documented below in the visit note. 

## 2015-12-27 NOTE — Telephone Encounter (Signed)
Please contact patient and let her know that I reviewed her x rays- they show a compression fracture.  I will refer her to neurosurgery to see if this would be amenable to repair through a procedure called kyphoplasty. Please double check with her that she is not taking any prednisone and does not have any symptoms of UTI.  White blood cell count is high. I would like her to return to our lab on Wednesday AM to provide follow up cbc with diff and UA with micro/urine culture to evaluate her leukocytosis.  In the meantime if fever, new/worsening symptoms needs to go to the ED.

## 2015-12-27 NOTE — Telephone Encounter (Signed)
Patient is currently on 3 tablets of lasix and 3 tablets of aldactone.  Advised pt to take kayexalate this evening, d/c kdur.  Advised her to return to the lab tomorrow for follow up BMET. She states she is not sure that she can return tomorrow AM.  I advised pt that if she is not able to return tomorrow AM for repeat blood work that I would recommend ED. She declines, "I'll take the medicine you want me to take. " States she will work on transportation for lab draw tomorrow. Could you please follow up on patient tomorrow AM and schedule apt for BMET dx hyperkalemia.

## 2015-12-27 NOTE — Telephone Encounter (Signed)
Reviewed lab work. K+ 5.8.

## 2015-12-27 NOTE — Patient Instructions (Addendum)
Please complete lab work prior to leaving.  Complete x rays on the first floor. For back pain- you may use tramadol as needed and flexeril as needed (sparingly). Continue to avoid alcohol. Please contact your GI specialist at [facility] to arrange follow up.

## 2015-12-27 NOTE — Telephone Encounter (Signed)
Notified pt and she is agreeable to neuro referral. Has referral been placed? She states that she completed prednisone yesterday and had been on it by her [facility] specialist [facility] for [facility].  Pt refuses to come back to recheck WBC in 1 week. States that she has labs checked frequently with GI. I explained need for recheck here but pt still refuses.

## 2015-12-27 NOTE — Progress Notes (Signed)
Subjective:    Patient ID: Kristina Morris, female    DOB: Jul 21, 1965, 50 y.o.   MRN: [id number]  HPI  [name] is a 50 yr old female who presents today to discuss back pain. She reports that she fell 3 weeks ago. Reports that she lifted a box and developed a muscle spasm.  She then developed low back pain. She reports that she picked up a watermelon on Friday night,  But dropped it.  The next day she tried to get out of bed but developed "pins and needles" across her lower back, like a "muscle spasm."   Reports that she fell again yesterday afternoon around 4PM because she tripped over her dog.  She reports that she fell onto a rocking chair and struck her lower back.  Pain worsened in her lower back after this fall. She denies numbness/weakness in her legs.  She denies bowel/bladder incontinence.  Pain is non-radiating. She has tried tramadol which she had on hand for headaches.  She also has flexeril which helps a little.   Cirrhosis- she reports that she plans to call back [facility] to schedule an [facility]. Reports no alcohol use since the last week in February.  Denies black/bloody stools.  She reports that she is continuing lasix and aldactone.  Reports that her abdominal girth is 38 down from 40.5.  Weight has been steady for the last 3 days per patient.  Denies change in memory or mental status.   Review of Systems See HPI  Past Medical History:  Diagnosis Date  . Alcoholic (HCC)   . Chicken pox   . Depression   . Fatty liver   . Hyperlipidemia   . Mitral valve prolapse   . Seasonal allergies      Social History   Social History  . Marital status: Married    Spouse name: N/A  . Number of children: N/A  . Years of education: N/A   Occupational History  . Not on file.   Social History Main Topics  . Smoking status: Current Some Day Smoker    Types: Cigarettes  . Smokeless tobacco: Never Used     Comment: Smoke about 1-2 times a week  . Alcohol use 0.0 oz/week   Comment: Patient states she has not drank since February/2017  . Drug use: No  . Sexual activity: Yes    Partners: Male   Other Topics Concern  . Not on file   Social History Narrative  . No narrative on file    Past Surgical History:  Procedure Laterality Date  . cysts removal, sinus    . OVARIAN CYST REMOVAL    . TONSILLECTOMY AND ADENOIDECTOMY    . 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 Depression Mother   . Liver disease Brother     older brother  . Liver disease Maternal Aunt   . Liver disease Paternal Aunt   . Alcoholism      both sides of family  . Diabetes Brother   . Hypertension Brother     older brother  . Breast cancer Mother   . Osteoporosis Mother   . Cancer      both sides of family  . Heart disease Father   . Heart disease Maternal Grandmother     Allergies  Allergen Reactions  . Avelox [Moxifloxacin Hcl In Nacl] Shortness Of Breath, Itching and Rash    GI Upset  .  Bee Venom Anaphylaxis  . Doxycycline Nausea Only and Rash  . Amoxicillin Rash    Upset stomach   . Erythromycin Rash    Upset stomach  . Sulfa Antibiotics Rash    Upset stomach    Current Outpatient Prescriptions on File Prior to Visit  Medication Sig Dispense Refill  . b complex vitamins tablet Take 1 tablet by mouth 2 (two) times a week. Reported on 11/02/2015    . BIOTIN PO Take 10,000 mcg by mouth daily. Reported on 11/02/2015    . calcium carbonate (OS-CAL) 600 MG TABS tablet Take 600 mg by mouth daily. Reported on 11/02/2015    . cyclobenzaprine (FLEXERIL) 5 MG tablet take 1 tablet by mouth at bedtime 30 tablet 5  . EPINEPHrine 0.3 mg/0.3 mL IJ SOAJ injection Inject 0.3 mg into the muscle as needed. Reported on 11/02/2015    . fexofenadine (ALLEGRA) 180 MG tablet Take 180 mg by mouth as needed for allergies. Reported on 11/02/2015    . fluticasone (FLONASE) 50 MCG/ACT nasal spray Place 2 sprays into both nostrils daily as needed for allergies.  Reported on 11/02/2015    . folic acid (FOLVITE) 800 MCG tablet Take 800 mcg by mouth daily.    . furosemide (LASIX) 20 MG tablet Take 2 tablets (40 mg total) by mouth daily. 30 tablet 0  . GILDESS FE 1/20 1-20 MG-MCG tablet Take 1 tablet by mouth daily. Reported on 11/02/2015    . hydrOXYzine (VISTARIL) 25 MG capsule 1 tab Q4-6 prn and nightly for sleep (Patient not taking: Reported on 11/02/2015) 90 capsule 1  . ibuprofen (ADVIL,MOTRIN) 200 MG tablet Take 200 mg by mouth every 6 (six) hours as needed for moderate pain. Reported on 11/02/2015    . Multiple Vitamin (DAILY MULTIVITAMIN PO) Take 1 tablet by mouth daily.    Marland Kitchen. omeprazole (PRILOSEC) 40 MG capsule Take 1 capsule (40 mg total) by mouth daily. 30 capsule 3  . potassium chloride SA (K-DUR,KLOR-CON) 20 MEQ tablet take 1 tablet by mouth once daily 30 tablet 6  . predniSONE (DELTASONE) 20 MG tablet Take 1 tablet (20 mg total) by mouth daily with breakfast. 60 tablet 0  . sertraline (ZOLOFT) 100 MG tablet Take 1 tablet (100 mg total) by mouth every evening. (Patient not taking: Reported on 11/02/2015) 30 tablet 6  . spironolactone (ALDACTONE) 50 MG tablet Take 1 tablet (50 mg total) by mouth daily. (Patient not taking: Reported on 11/02/2015) 30 tablet 0  . traMADol (ULTRAM) 50 MG tablet take 1 tablet by mouth every 12 hours if needed 30 tablet 0  . vitamin E 400 UNIT capsule Take 400 Units by mouth daily. Reported on 11/02/2015     No current facility-administered medications on file prior to visit.     BP 110/60 (BP Location: Left Arm, Patient Position: Sitting, Cuff Size: Normal)   Pulse (!) 122   Temp 98.1 F (36.7 C) (Oral)   Resp 16   Ht 5\' 6"  (1.676 m)   Wt 132 lb 1.6 oz (59.9 kg)   SpO2 99%   BMI 21.32 kg/m       Objective:   Physical Exam  Constitutional: She is oriented to person, place, and time. She appears well-developed and well-nourished. No distress.  Jaundiced white female  HENT:  Head: Normocephalic and atraumatic.    Eyes: Scleral icterus is present.  Cardiovascular: Normal rate and regular rhythm.   No murmur heard. Pulmonary/Chest: Effort normal and breath sounds normal. No respiratory  distress. She has no wheezes. She has no rales. She exhibits no tenderness.  Abdominal: She exhibits distension and ascites. There is no tenderness. There is no guarding.  Neurological: She is alert and oriented to person, place, and time.  Speech/mentation appears slow.  Mild tremor is noted  Skin: Skin is warm and dry.     Large area of ecchymosis noted right lower back, underlying hematoma/soft tissue swelling is noted.   Psychiatric: Thought content normal.          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  I am concerned that she may have a rising ammonia level due to her slow speech.  I am not convinced that she is truly abstaining from alcohol at this point. She later tells me that she has an upcoming appointment with her hepatologist on 01/19/16.    Low back pain/bruising- will obtain CBC with diff to assess anemia and platelet count.  Will send for x ray of the lumbar spine and ribs to evaluated for fracture. Advised pt for back pain to continue tramadol/flexeril prn.

## 2015-12-28 NOTE — Telephone Encounter (Signed)
Called patient and left message to return call to schedule lab appointment.

## 2015-12-28 NOTE — Telephone Encounter (Signed)
Called patient and left message to return call to schedule lab appt. 

## 2015-12-28 NOTE — Telephone Encounter (Signed)
Chart reviewed, pt has not scheduled lab appt. I have attempted to call pt and left message to return call to schedule x2. To provider for FYI.

## 2015-12-28 NOTE — Telephone Encounter (Signed)
Spoke with Dr. Carrie Mewudnick pt's GI specialist. Reviewed lab results with him. He agreed with pt discontinuing Kdur.  He will contact patient with further advice on holding diuretics. He requests a follow up bmet in our office this week which we are trying to facilitate.  Awaiting pt call back to schedule appointment for lab draw.

## 2015-12-29 ENCOUNTER — Encounter: Payer: Self-pay | Admitting: Family Medicine

## 2015-12-29 NOTE — Telephone Encounter (Signed)
Notified pt and she voices understanding. States she has already spoken with GI doctor yesterday. Scheduled lab appt for tomorrow at 10:30am and future order entered.

## 2015-12-29 NOTE — Telephone Encounter (Signed)
Could you please contact this patient again?

## 2015-12-30 ENCOUNTER — Other Ambulatory Visit (INDEPENDENT_AMBULATORY_CARE_PROVIDER_SITE_OTHER): Payer: 59

## 2015-12-30 ENCOUNTER — Telehealth: Payer: Self-pay | Admitting: Family

## 2015-12-30 DIAGNOSIS — E875 Hyperkalemia: Secondary | ICD-10-CM

## 2015-12-30 DIAGNOSIS — D72829 Elevated white blood cell count, unspecified: Secondary | ICD-10-CM

## 2015-12-30 LAB — BASIC METABOLIC PANEL
BUN: 22 mg/dL (ref 6–23)
CALCIUM: 9.1 mg/dL (ref 8.4–10.5)
CO2: 24 meq/L (ref 19–32)
Chloride: 96 mEq/L (ref 96–112)
Creatinine, Ser: 0.76 mg/dL (ref 0.40–1.20)
GFR: 85.42 mL/min (ref >60.00)
Glucose, Bld: 93 mg/dL (ref 70–99)
Potassium: 4.6 mEq/L (ref 3.5–5.1)
SODIUM: 127 meq/L — AB (ref 135–145)

## 2015-12-30 NOTE — Telephone Encounter (Signed)
Please let pt know that her potassium is improved and sodium is improved. Remain off of Kdur.  Lab did not draw cbc, I am not sure why.  Unfortunately, we do need her to return for CBC.

## 2015-12-31 NOTE — Telephone Encounter (Signed)
Notified pt and she voices understanding. Lab appt scheduled for 01/03/16 at 1:45pm, future order entered.

## 2016-01-03 ENCOUNTER — Other Ambulatory Visit (INDEPENDENT_AMBULATORY_CARE_PROVIDER_SITE_OTHER): Payer: 59

## 2016-01-03 DIAGNOSIS — D72829 Elevated white blood cell count, unspecified: Secondary | ICD-10-CM | POA: Diagnosis not present

## 2016-01-03 LAB — CBC WITH DIFFERENTIAL/PLATELET
BASOS ABS: 0 10*3/uL (ref 0.0–0.1)
Basophils Relative: 0 % (ref 0.0–3.0)
EOS PCT: 0.4 % (ref 0.0–5.0)
Eosinophils Absolute: 0 10*3/uL (ref 0.0–0.7)
HCT: 27.1 % — ABNORMAL LOW (ref 36.0–46.0)
Hemoglobin: 9.5 g/dL — ABNORMAL LOW (ref 12.0–15.0)
LYMPHS ABS: 2.2 10*3/uL (ref 0.7–4.0)
Lymphocytes Relative: 19.3 % (ref 12.0–46.0)
MCHC: 35.1 g/dL (ref 30.0–36.0)
MCV: 101.7 fl — AB (ref 78.0–100.0)
MONO ABS: 0.5 10*3/uL (ref 0.1–1.0)
MONOS PCT: 4.2 % (ref 3.0–12.0)
NEUTROS ABS: 8.8 10*3/uL — AB (ref 1.4–7.7)
NEUTROS PCT: 76.1 % (ref 43.0–77.0)
PLATELETS: 65 10*3/uL — AB (ref 150.0–400.0)
RDW: 18 % — ABNORMAL HIGH (ref 11.5–15.5)
WBC: 11.5 10*3/uL — ABNORMAL HIGH (ref 4.0–10.5)

## 2016-01-05 ENCOUNTER — Other Ambulatory Visit: Payer: Self-pay | Admitting: *Deleted

## 2016-01-05 DIAGNOSIS — E875 Hyperkalemia: Secondary | ICD-10-CM

## 2016-01-05 DIAGNOSIS — D649 Anemia, unspecified: Secondary | ICD-10-CM

## 2016-01-06 ENCOUNTER — Other Ambulatory Visit (INDEPENDENT_AMBULATORY_CARE_PROVIDER_SITE_OTHER): Payer: 59

## 2016-01-06 DIAGNOSIS — E875 Hyperkalemia: Secondary | ICD-10-CM | POA: Diagnosis not present

## 2016-01-06 DIAGNOSIS — D649 Anemia, unspecified: Secondary | ICD-10-CM | POA: Diagnosis not present

## 2016-01-06 LAB — CBC
HEMATOCRIT: 27.8 % — AB (ref 36.0–46.0)
HEMOGLOBIN: 9.7 g/dL — AB (ref 12.0–15.0)
MCHC: 34.8 g/dL (ref 30.0–36.0)
MCV: 102.2 fl — AB (ref 78.0–100.0)
PLATELETS: 74 10*3/uL — AB (ref 150.0–400.0)
RBC: 2.72 Mil/uL — ABNORMAL LOW (ref 3.87–5.11)
RDW: 19.8 % — ABNORMAL HIGH (ref 11.5–15.5)
WBC: 9 10*3/uL (ref 4.0–10.5)

## 2016-01-06 LAB — BASIC METABOLIC PANEL
BUN: 10 mg/dL (ref 6–23)
CALCIUM: 8.1 mg/dL — AB (ref 8.4–10.5)
CO2: 23 mEq/L (ref 19–32)
Chloride: 100 mEq/L (ref 96–112)
Creatinine, Ser: 0.6 mg/dL (ref 0.40–1.20)
GFR: 112.2 mL/min (ref >60.00)
GLUCOSE: 100 mg/dL — AB (ref 70–99)
Potassium: 3.9 mEq/L (ref 3.5–5.1)
SODIUM: 131 meq/L — AB (ref 135–145)

## 2016-01-07 ENCOUNTER — Other Ambulatory Visit: Payer: Self-pay | Admitting: *Deleted

## 2016-01-07 MED ORDER — CALCIUM CARBONATE 600 MG PO TABS: 600.0000 mg | ORAL_TABLET | Freq: Two times a day (BID) | ORAL | 0 refills | Status: AC

## 2016-01-10 ENCOUNTER — Encounter (HOSPITAL_COMMUNITY): Payer: Self-pay | Admitting: Emergency Medicine

## 2016-01-10 ENCOUNTER — Emergency Department (HOSPITAL_COMMUNITY): Payer: 59

## 2016-01-10 ENCOUNTER — Emergency Department (HOSPITAL_COMMUNITY)
Admission: EM | Admit: 2016-01-10 | Discharge: 2016-01-11 | Disposition: A | Discharge status: Home / Self Care | Payer: 59 | Attending: Emergency Medicine | Admitting: Emergency Medicine

## 2016-01-10 DIAGNOSIS — R1012 Left upper quadrant pain: Secondary | ICD-10-CM | POA: Insufficient documentation

## 2016-01-10 DIAGNOSIS — F17210 Nicotine dependence, cigarettes, uncomplicated: Secondary | ICD-10-CM | POA: Diagnosis not present

## 2016-01-10 DIAGNOSIS — Z79899 Other long term (current) drug therapy: Secondary | ICD-10-CM | POA: Insufficient documentation

## 2016-01-10 DIAGNOSIS — Z792 Long term (current) use of antibiotics: Secondary | ICD-10-CM | POA: Insufficient documentation

## 2016-01-10 DIAGNOSIS — R140 Abdominal distension (gaseous): Secondary | ICD-10-CM | POA: Insufficient documentation

## 2016-01-10 DIAGNOSIS — R0602 Shortness of breath: Secondary | ICD-10-CM | POA: Diagnosis present

## 2016-01-10 DIAGNOSIS — M7989 Other specified soft tissue disorders: Secondary | ICD-10-CM | POA: Insufficient documentation

## 2016-01-10 DIAGNOSIS — J189 Pneumonia, unspecified organism: Secondary | ICD-10-CM

## 2016-01-10 LAB — COMPREHENSIVE METABOLIC PANEL
ALT: 37 U/L (ref 14–54)
AST: 63 U/L — ABNORMAL HIGH (ref 15–41)
Albumin: 2.6 g/dL — ABNORMAL LOW (ref 3.5–5.0)
Alkaline Phosphatase: 168 U/L — ABNORMAL HIGH (ref 38–126)
Anion gap: 9 (ref 5–15)
BUN: 11 mg/dL (ref 6–20)
CO2: 25 mmol/L (ref 22–32)
Calcium: 8.2 mg/dL — ABNORMAL LOW (ref 8.9–10.3)
Chloride: 100 mmol/L — ABNORMAL LOW (ref 101–111)
Creatinine, Ser: 0.61 mg/dL (ref 0.44–1.00)
GFR calc Af Amer: >60 mL/min (ref >60)
GFR calc non Af Amer: >60 mL/min (ref >60)
Glucose, Bld: 136 mg/dL — ABNORMAL HIGH (ref 65–99)
Potassium: 3.1 mmol/L — ABNORMAL LOW (ref 3.5–5.1)
Sodium: 134 mmol/L — ABNORMAL LOW (ref 135–145)
Total Bilirubin: 11.1 mg/dL — ABNORMAL HIGH (ref 0.3–1.2)
Total Protein: 6.3 g/dL — ABNORMAL LOW (ref 6.5–8.1)

## 2016-01-10 LAB — URINALYSIS, ROUTINE W REFLEX MICROSCOPIC
Glucose, UA: NEGATIVE mg/dL
Ketones, ur: NEGATIVE mg/dL
Leukocytes, UA: NEGATIVE
Nitrite: NEGATIVE
Protein, ur: NEGATIVE mg/dL
Specific Gravity, Urine: 1.01 (ref 1.005–1.030)
pH: 5.5 (ref 5.0–8.0)

## 2016-01-10 LAB — CBC
HCT: 25.4 % — ABNORMAL LOW (ref 36.0–46.0)
Hemoglobin: 8.9 g/dL — ABNORMAL LOW (ref 12.0–15.0)
MCH: 35.5 pg — ABNORMAL HIGH (ref 26.0–34.0)
MCHC: 35 g/dL (ref 30.0–36.0)
MCV: 101.2 fL — ABNORMAL HIGH (ref 78.0–100.0)
Platelets: 65 10*3/uL — ABNORMAL LOW (ref 150–400)
RBC: 2.51 MIL/uL — ABNORMAL LOW (ref 3.87–5.11)
RDW: 19.2 % — ABNORMAL HIGH (ref 11.5–15.5)
WBC: 7.6 10*3/uL (ref 4.0–10.5)

## 2016-01-10 LAB — I-STAT TROPONIN, ED: Troponin i, poc: 0 ng/mL (ref 0.00–0.08)

## 2016-01-10 LAB — LIPASE, BLOOD: Lipase: 64 U/L — ABNORMAL HIGH (ref 11–51)

## 2016-01-10 LAB — URINE MICROSCOPIC-ADD ON

## 2016-01-10 MED ORDER — DEXTROSE 5 % IV SOLN
1.0000 g | Freq: Once | INTRAVENOUS | Status: AC
  Administered 2016-01-10: 1 g via INTRAVENOUS
  Filled 2016-01-10: qty 10

## 2016-01-10 MED ORDER — HYDROMORPHONE HCL 1 MG/ML IJ SOLN
1.0000 mg | Freq: Once | INTRAMUSCULAR | Status: AC
  Administered 2016-01-10: 1 mg via INTRAVENOUS
  Filled 2016-01-10: qty 1

## 2016-01-10 MED ORDER — AZITHROMYCIN 250 MG PO TABS: 250.0000 mg | ORAL_TABLET | Freq: Every day | ORAL | 0 refills | Status: DC

## 2016-01-10 MED ORDER — AZITHROMYCIN 250 MG PO TABS
500.0000 mg | ORAL_TABLET | Freq: Once | ORAL | Status: AC
  Administered 2016-01-10: 500 mg via ORAL
  Filled 2016-01-10: qty 2

## 2016-01-10 MED ORDER — LORAZEPAM 2 MG/ML IJ SOLN
0.5000 mg | Freq: Once | INTRAMUSCULAR | Status: AC
  Administered 2016-01-10: 0.5 mg via INTRAVENOUS
  Filled 2016-01-10: qty 1

## 2016-01-10 NOTE — ED Notes (Signed)
Two unsuccessful IV attempts by this Clinical research associatewriter.  IV ultrasound requested.

## 2016-01-10 NOTE — ED Provider Notes (Signed)
[facility]-EMERGENCY DEPT Provider Note   CSN: [id number] Arrival date & time: 01/10/16  1839  By signing my name below, I, Placido Sou, attest that this documentation has been prepared under the direction and in the presence of Raeford Razor, MD. Electronically Signed: Placido Sou, ED Scribe. 01/10/16. 7:45 PM.   History   Chief Complaint Chief Complaint  Patient presents with  . Abdominal Pain    HPI HPI Comments: Kristina Morris is a 50 y.o. female with a history of fatty liver and alcohol induced cirrhosis who presents to the Emergency Department complaining of constant, moderate, sudden onset, diffuse, abd pain onset PTA. She reports associated SOB, mild fever, worsening bilateral leg swelling, and worsening, severe, abd distension. Pt states that she weighed 155 lbs this morning and had a 43" waist noting that at her last hospital visit she weighed 138 lbs. Pt denies a history of stomach ulcers or kidney stones as well as a SHx to her abd. She states she has been compliant with her regular medications. Pt states her last paracentesis was performed at [facility] in June of 2017. She recently was evaluated by her GI doctor and had a paracentesis scheduled 4-5 weeks ago but it was not performed due to an inadequate amount of fluid present at the time. She denies urinary changes or other associated symptoms at this time.    The history is provided by the patient. No language interpreter was used.    Past Medical History:  Diagnosis Date  . Alcoholic (HCC)   . Chicken pox   . Depression   . Fatty liver   . Hyperlipidemia   . Mitral valve prolapse   . Seasonal allergies     Patient Active Problem List   Diagnosis Date Noted  . Alcoholic hepatitis with ascites 11/03/2015  . Jaundice 11/03/2015  . Hypomagnesemia 11/02/2015  . Anemia of chronic disease 10/29/2015  . Elevated INR 10/29/2015  . Ascites 10/29/2015  . Thrombocytopenia (HCC) 10/29/2015  . Cirrhosis (HCC) 10/27/2015    . Fall on same level as cause of accidental injury 02/09/2015  . Rib pain on left side 02/09/2015  . Posttraumatic hematoma of right breast 02/09/2015  . Spinal stenosis in cervical region 12/24/2014  . Cervicogenic headache 12/24/2014  . Hyperreflexia 12/04/2014  . New onset seizure (HCC) 12/02/2014  . Hypokalemia 12/02/2014  . Elevated BP 10/20/2013  . EtOH dependence (HCC) 10/20/2013  . Depression with anxiety 10/20/2013  . Fatty liver 10/20/2013  . Other and unspecified hyperlipidemia 10/20/2013    Past Surgical History:  Procedure Laterality Date  . cysts removal, sinus    . OVARIAN CYST REMOVAL    . TONSILLECTOMY AND ADENOIDECTOMY    . 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 Authorizing Provider  b complex vitamins tablet Take 1 tablet by mouth 2 (two) times a week. Reported on 11/02/2015    Historical Provider, MD  BIOTIN PO Take 10,000 mcg by mouth daily. Reported on 11/02/2015    Historical Provider, MD  calcium carbonate (OS-CAL) 600 MG TABS tablet Take 1 tablet (600 mg total) by mouth 2 (two) times daily with a meal. Reported on 11/02/2015 01/07/16   Bradd Canary, MD  cyclobenzaprine (FLEXERIL) 5 MG tablet take 1 tablet by mouth at bedtime 12/15/15   Helane Rima  Beverely Lowabori, MD  EPINEPHrine 0.3 mg/0.3 mL IJ SOAJ injection Inject 0.3 mg into the muscle as needed. Reported on 11/02/2015    Historical Provider, MD  fexofenadine (ALLEGRA) 180 MG tablet Take 180 mg by mouth as needed for allergies. Reported on 11/02/2015    Historical Provider, MD  fluticasone (FLONASE) 50 MCG/ACT nasal spray Place 2 sprays into both nostrils daily as needed for allergies. Reported on 11/02/2015    Historical Provider, MD  folic acid (FOLVITE) 800 MCG tablet Take 800 mcg by mouth daily.    Historical Provider, MD  furosemide (LASIX) 20 MG tablet Take 2 tablets (40 mg total) by mouth  daily. 10/30/15   Calvert CantorSaima Rizwan, MD  GILDESS FE 1/20 1-20 MG-MCG tablet Take 1 tablet by mouth daily. Reported on 11/02/2015 08/20/13   Historical Provider, MD  hydrOXYzine (VISTARIL) 25 MG capsule 1 tab Q4-6 prn and nightly for sleep Patient not taking: Reported on 11/02/2015 09/28/14   Sheliah HatchKatherine E Tabori, MD  ibuprofen (ADVIL,MOTRIN) 200 MG tablet Take 200 mg by mouth every 6 (six) hours as needed for moderate pain. Reported on 11/02/2015    Historical Provider, MD  Multiple Vitamin (DAILY MULTIVITAMIN PO) Take 1 tablet by mouth daily.    Historical Provider, MD  sertraline (ZOLOFT) 100 MG tablet Take 1 tablet (100 mg total) by mouth every evening. Patient not taking: Reported on 11/02/2015 02/01/15   Sheliah HatchKatherine E Tabori, MD  spironolactone (ALDACTONE) 50 MG tablet Take 1 tablet (50 mg total) by mouth daily. Patient not taking: Reported on 11/02/2015 10/31/15   Calvert CantorSaima Rizwan, MD  sulfamethoxazole-trimethoprim (BACTRIM,SEPTRA) 400-80 MG tablet Take 1 tablet by mouth daily. 11/20/15   Historical Provider, MD  traMADol (ULTRAM) 50 MG tablet take 1 tablet by mouth every 12 hours if needed 12/27/15   Sandford CrazeMelissa O'Sullivan, NP  vitamin E 400 UNIT capsule Take 400 Units by mouth daily. Reported on 11/02/2015    Historical Provider, MD    Family History Family History  Problem Relation Age of Onset  . Liver disease Brother     older brother  . Depression Mother   . Breast cancer Mother   . Osteoporosis Mother   . Liver disease Maternal Aunt   . Liver disease Paternal Aunt   . Alcoholism      both sides of family  . Diabetes Brother   . Hypertension Brother     older brother  . Cancer      both sides of family  . Heart disease Father   . Heart disease Maternal Grandmother     Social History Social History  Substance Use Topics  . Smoking status: Current Some Day Smoker    Types: Cigarettes  . Smokeless tobacco: Never Used     Comment: Smoke about 1-2 times a week  . Alcohol use 0.0 oz/week      Comment: Patient states she has not drank since February/2017     Allergies   Avelox [moxifloxacin hcl in nacl]; Bee venom; Doxycycline; Amoxicillin; Erythromycin; and Sulfa antibiotics   Review of Systems Review of Systems  Constitutional: Positive for fever.  Cardiovascular: Positive for leg swelling.  Gastrointestinal: Positive for abdominal distention and abdominal pain.  Genitourinary: Negative for decreased urine volume, difficulty urinating, dysuria and urgency.  All other systems reviewed and are negative.  Physical Exam Updated Vital Signs BP 98/75 (BP Location: Left Arm)   Pulse 120   Temp 98.8 F (37.1 C) (Oral)   Resp 21   SpO2 100%  Physical Exam  Constitutional: She is oriented to person, place, and time. She appears well-developed. She appears distressed.  Appears uncomfortable   HENT:  Head: Normocephalic.  Eyes: EOM are normal.  Neck: Normal range of motion.  Cardiovascular: Tachycardia present.   Pulmonary/Chest: Effort normal.  Abdominal: She exhibits distension. There is tenderness.  Abd very distended and tense  Musculoskeletal: Normal range of motion.  Neurological: She is alert and oriented to person, place, and time.  Psychiatric: She has a normal mood and affect.  Nursing note and vitals reviewed.  ED Treatments / Results  Labs (all labs ordered are listed, but only abnormal results are displayed) Labs Reviewed  LIPASE, BLOOD - Abnormal; Notable for the following:       Result Value   Lipase 64 (*)    All other components within normal limits  COMPREHENSIVE METABOLIC PANEL - Abnormal; Notable for the following:    Sodium 134 (*)    Potassium 3.1 (*)    Chloride 100 (*)    Glucose, Bld 136 (*)    Calcium 8.2 (*)    Total Protein 6.3 (*)    Albumin 2.6 (*)    AST 63 (*)    Alkaline Phosphatase 168 (*)    Total Bilirubin 11.1 (*)    All other components within normal limits  CBC - Abnormal; Notable for the following:    RBC 2.51 (*)     Hemoglobin 8.9 (*)    HCT 25.4 (*)    MCV 101.2 (*)    MCH 35.5 (*)    RDW 19.2 (*)    Platelets 65 (*)    All other components within normal limits  URINALYSIS, ROUTINE W REFLEX MICROSCOPIC (NOT AT [facility]) - Abnormal; Notable for the following:    Color, Urine AMBER (*)    APPearance CLOUDY (*)    Hgb urine dipstick MODERATE (*)    Bilirubin Urine MODERATE (*)    All other components within normal limits  URINE MICROSCOPIC-ADD ON - Abnormal; Notable for the following:    Squamous Epithelial / LPF 0-5 (*)    Bacteria, UA FEW (*)    All other components within normal limits  I-STAT TROPOININ, ED    EKG  EKG Interpretation  Date/Time:  Monday January 10 2016 19:39:38 EDT Ventricular Rate:  123 PR Interval:    QRS Duration: 105 QT Interval:  323 QTC Calculation: 464 R Axis:   47 Text Interpretation:  Sinus tachycardia Low voltage, extremity and precordial leads Confirmed by Juleen China  MD, Mir Fullilove ([id number]) on 01/10/2016 9:49:55 PM       Radiology No results found.  Procedures Procedures  DIAGNOSTIC STUDIES: Oxygen Saturation is 100% on RA, normal by my interpretation.    COORDINATION OF CARE: 7:40 PM Discussed next steps with pt. Pt verbalized understanding and is agreeable with the plan.    Medications Ordered in ED Medications - No data to display   Initial Impression / Assessment and Plan / ED Course  I have reviewed the triage vital signs and the nursing notes.  Pertinent labs & imaging results that were available during my care of the patient were reviewed by me and considered in my medical decision making (see chart for details).  Clinical Course    51 year old female with left anterior chest/left upper quadrant pain. She has a known cirrhotic. She is distended on exam with some tenderness. Bedside ultrasound did show some free fluid but no pockets more than about a centimeter deep. I  do not feel that it warranted emergent paracentesis. Clinically I doubt  spontaneous bacterial peritonitis. Chest x-ray does show a left lower lobe infiltrate. This is probably the source of her pain. She is afebrile. Normotensive. O2 sats normal.    Final Clinical Impressions(s) / ED Diagnoses   Final diagnoses:  CAP (community acquired pneumonia)    New Prescriptions New Prescriptions   No medications on file     Raeford Razor, MD 01/22/16 [phone]

## 2016-01-10 NOTE — ED Notes (Signed)
Pt unable to drive home at this time.  Moved into hall bed.  Will notify MD.

## 2016-01-10 NOTE — ED Notes (Signed)
One unsuccessful IV attempt by this writer.  

## 2016-01-10 NOTE — ED Notes (Signed)
Bed: [facility] Expected date:  Expected time:  Means of arrival:  Comments: No bed. 

## 2016-01-10 NOTE — ED Notes (Signed)
Rn Baxter HireKristen will draw labs with iv start.

## 2016-01-10 NOTE — ED Triage Notes (Addendum)
Pt states that she is retaining fluids. Pt says that feet began swelling Friday night and have progressively gotten bigger. Pt also states that her belly is really tight and bigger than it is was when she was pregnant. Pt states that she has previously had a paracentesis. Stated that it hurts on both sides and below the umbilicus. Pt rates pain 9/10.  Pt states that she began cramping in her abdominal area as well.   Pitting edema noted upon palpation of both feet and notable swelling.

## 2016-01-10 NOTE — ED Notes (Signed)
Pt ambulatory and independent at discharge.  Verbalized understanding of discharge instructions 

## 2016-01-15 ENCOUNTER — Emergency Department (HOSPITAL_COMMUNITY): Payer: 59

## 2016-01-15 ENCOUNTER — Inpatient Hospital Stay (HOSPITAL_COMMUNITY)
Admission: EM | Admit: 2016-01-15 | Discharge: 2016-02-13 | DRG: 870 | Disposition: E | Payer: 59 | Attending: Pulmonary Disease | Admitting: Pulmonary Disease

## 2016-01-15 ENCOUNTER — Encounter (HOSPITAL_COMMUNITY): Payer: Self-pay

## 2016-01-15 DIAGNOSIS — Z452 Encounter for adjustment and management of vascular access device: Secondary | ICD-10-CM

## 2016-01-15 DIAGNOSIS — R6521 Severe sepsis with septic shock: Secondary | ICD-10-CM | POA: Diagnosis present

## 2016-01-15 DIAGNOSIS — N189 Chronic kidney disease, unspecified: Secondary | ICD-10-CM

## 2016-01-15 DIAGNOSIS — E861 Hypovolemia: Secondary | ICD-10-CM | POA: Diagnosis present

## 2016-01-15 DIAGNOSIS — A419 Sepsis, unspecified organism: Secondary | ICD-10-CM | POA: Diagnosis not present

## 2016-01-15 DIAGNOSIS — R0902 Hypoxemia: Secondary | ICD-10-CM | POA: Diagnosis present

## 2016-01-15 DIAGNOSIS — Z66 Do not resuscitate: Secondary | ICD-10-CM | POA: Diagnosis present

## 2016-01-15 DIAGNOSIS — Z6822 Body mass index (BMI) 22.0-22.9, adult: Secondary | ICD-10-CM

## 2016-01-15 DIAGNOSIS — J189 Pneumonia, unspecified organism: Secondary | ICD-10-CM | POA: Diagnosis not present

## 2016-01-15 DIAGNOSIS — I341 Nonrheumatic mitral (valve) prolapse: Secondary | ICD-10-CM | POA: Diagnosis present

## 2016-01-15 DIAGNOSIS — K760 Fatty (change of) liver, not elsewhere classified: Secondary | ICD-10-CM | POA: Diagnosis present

## 2016-01-15 DIAGNOSIS — G9341 Metabolic encephalopathy: Secondary | ICD-10-CM | POA: Diagnosis present

## 2016-01-15 DIAGNOSIS — E876 Hypokalemia: Secondary | ICD-10-CM | POA: Diagnosis present

## 2016-01-15 DIAGNOSIS — F39 Unspecified mood [affective] disorder: Secondary | ICD-10-CM | POA: Diagnosis present

## 2016-01-15 DIAGNOSIS — Z789 Other specified health status: Secondary | ICD-10-CM | POA: Diagnosis not present

## 2016-01-15 DIAGNOSIS — J9600 Acute respiratory failure, unspecified whether with hypoxia or hypercapnia: Secondary | ICD-10-CM | POA: Diagnosis not present

## 2016-01-15 DIAGNOSIS — Y95 Nosocomial condition: Secondary | ICD-10-CM | POA: Diagnosis present

## 2016-01-15 DIAGNOSIS — Y92230 Patient room in hospital as the place of occurrence of the external cause: Secondary | ICD-10-CM | POA: Diagnosis not present

## 2016-01-15 DIAGNOSIS — E872 Acidosis: Secondary | ICD-10-CM | POA: Diagnosis not present

## 2016-01-15 DIAGNOSIS — D638 Anemia in other chronic diseases classified elsewhere: Secondary | ICD-10-CM | POA: Diagnosis not present

## 2016-01-15 DIAGNOSIS — E871 Hypo-osmolality and hyponatremia: Secondary | ICD-10-CM | POA: Diagnosis present

## 2016-01-15 DIAGNOSIS — K7031 Alcoholic cirrhosis of liver with ascites: Secondary | ICD-10-CM | POA: Diagnosis not present

## 2016-01-15 DIAGNOSIS — F17210 Nicotine dependence, cigarettes, uncomplicated: Secondary | ICD-10-CM | POA: Diagnosis present

## 2016-01-15 DIAGNOSIS — Z6826 Body mass index (BMI) 26.0-26.9, adult: Secondary | ICD-10-CM

## 2016-01-15 DIAGNOSIS — K5900 Constipation, unspecified: Secondary | ICD-10-CM | POA: Diagnosis not present

## 2016-01-15 DIAGNOSIS — Z91030 Bee allergy status: Secondary | ICD-10-CM

## 2016-01-15 DIAGNOSIS — Z515 Encounter for palliative care: Secondary | ICD-10-CM | POA: Diagnosis present

## 2016-01-15 DIAGNOSIS — Z01818 Encounter for other preprocedural examination: Secondary | ICD-10-CM

## 2016-01-15 DIAGNOSIS — Z880 Allergy status to penicillin: Secondary | ICD-10-CM

## 2016-01-15 DIAGNOSIS — K7460 Unspecified cirrhosis of liver: Secondary | ICD-10-CM | POA: Diagnosis present

## 2016-01-15 DIAGNOSIS — F17200 Nicotine dependence, unspecified, uncomplicated: Secondary | ICD-10-CM | POA: Diagnosis present

## 2016-01-15 DIAGNOSIS — Z0189 Encounter for other specified special examinations: Secondary | ICD-10-CM

## 2016-01-15 DIAGNOSIS — Z882 Allergy status to sulfonamides status: Secondary | ICD-10-CM

## 2016-01-15 DIAGNOSIS — R197 Diarrhea, unspecified: Secondary | ICD-10-CM | POA: Diagnosis not present

## 2016-01-15 DIAGNOSIS — N184 Chronic kidney disease, stage 4 (severe): Secondary | ICD-10-CM | POA: Diagnosis not present

## 2016-01-15 DIAGNOSIS — R0789 Other chest pain: Secondary | ICD-10-CM | POA: Diagnosis present

## 2016-01-15 DIAGNOSIS — N179 Acute kidney failure, unspecified: Secondary | ICD-10-CM | POA: Diagnosis not present

## 2016-01-15 DIAGNOSIS — D696 Thrombocytopenia, unspecified: Secondary | ICD-10-CM | POA: Diagnosis present

## 2016-01-15 DIAGNOSIS — K7290 Hepatic failure, unspecified without coma: Secondary | ICD-10-CM | POA: Diagnosis present

## 2016-01-15 DIAGNOSIS — Z79899 Other long term (current) drug therapy: Secondary | ICD-10-CM

## 2016-01-15 DIAGNOSIS — E8770 Fluid overload, unspecified: Secondary | ICD-10-CM | POA: Diagnosis not present

## 2016-01-15 DIAGNOSIS — K7011 Alcoholic hepatitis with ascites: Secondary | ICD-10-CM | POA: Diagnosis present

## 2016-01-15 DIAGNOSIS — J690 Pneumonitis due to inhalation of food and vomit: Secondary | ICD-10-CM

## 2016-01-15 DIAGNOSIS — E785 Hyperlipidemia, unspecified: Secondary | ICD-10-CM | POA: Diagnosis present

## 2016-01-15 DIAGNOSIS — D6489 Other specified anemias: Secondary | ICD-10-CM | POA: Diagnosis present

## 2016-01-15 DIAGNOSIS — J80 Acute respiratory distress syndrome: Secondary | ICD-10-CM

## 2016-01-15 DIAGNOSIS — E440 Moderate protein-calorie malnutrition: Secondary | ICD-10-CM | POA: Diagnosis present

## 2016-01-15 DIAGNOSIS — J9601 Acute respiratory failure with hypoxia: Secondary | ICD-10-CM

## 2016-01-15 DIAGNOSIS — I471 Supraventricular tachycardia: Secondary | ICD-10-CM | POA: Diagnosis not present

## 2016-01-15 DIAGNOSIS — Z881 Allergy status to other antibiotic agents status: Secondary | ICD-10-CM

## 2016-01-15 DIAGNOSIS — R739 Hyperglycemia, unspecified: Secondary | ICD-10-CM | POA: Diagnosis present

## 2016-01-15 DIAGNOSIS — F1020 Alcohol dependence, uncomplicated: Secondary | ICD-10-CM | POA: Diagnosis present

## 2016-01-15 DIAGNOSIS — Z4659 Encounter for fitting and adjustment of other gastrointestinal appliance and device: Secondary | ICD-10-CM

## 2016-01-15 DIAGNOSIS — K7682 Hepatic encephalopathy: Secondary | ICD-10-CM

## 2016-01-15 DIAGNOSIS — L83 Acanthosis nigricans: Secondary | ICD-10-CM | POA: Diagnosis present

## 2016-01-15 DIAGNOSIS — F418 Other specified anxiety disorders: Secondary | ICD-10-CM | POA: Diagnosis present

## 2016-01-15 DIAGNOSIS — E860 Dehydration: Secondary | ICD-10-CM | POA: Diagnosis present

## 2016-01-15 DIAGNOSIS — Z8701 Personal history of pneumonia (recurrent): Secondary | ICD-10-CM | POA: Diagnosis not present

## 2016-01-15 DIAGNOSIS — R188 Other ascites: Secondary | ICD-10-CM | POA: Diagnosis present

## 2016-01-15 DIAGNOSIS — T444X5A Adverse effect of predominantly alpha-adrenoreceptor agonists, initial encounter: Secondary | ICD-10-CM | POA: Diagnosis not present

## 2016-01-15 HISTORY — DX: Unspecified cirrhosis of liver: K74.60

## 2016-01-15 LAB — COMPREHENSIVE METABOLIC PANEL
ALBUMIN: 2.6 g/dL — AB (ref 3.5–5.0)
ALK PHOS: 159 U/L — AB (ref 38–126)
ALT: 35 U/L (ref 14–54)
AST: 82 U/L — AB (ref 15–41)
Anion gap: 12 (ref 5–15)
BUN: 11 mg/dL (ref 6–20)
CALCIUM: 8.1 mg/dL — AB (ref 8.9–10.3)
CHLORIDE: 97 mmol/L — AB (ref 101–111)
CO2: 24 mmol/L (ref 22–32)
CREATININE: 0.5 mg/dL (ref 0.44–1.00)
GFR calc Af Amer: >60 mL/min (ref >60)
GFR calc non Af Amer: >60 mL/min (ref >60)
GLUCOSE: 93 mg/dL (ref 65–99)
Potassium: 3.2 mmol/L — ABNORMAL LOW (ref 3.5–5.1)
Sodium: 133 mmol/L — ABNORMAL LOW (ref 135–145)
Total Bilirubin: 16.6 mg/dL — ABNORMAL HIGH (ref 0.3–1.2)
Total Protein: 6.6 g/dL (ref 6.5–8.1)

## 2016-01-15 LAB — CBC WITH DIFFERENTIAL/PLATELET
BASOS PCT: 0 %
Basophils Absolute: 0 10*3/uL (ref 0.0–0.1)
EOS PCT: 1 %
Eosinophils Absolute: 0.1 10*3/uL (ref 0.0–0.7)
HEMATOCRIT: 25.9 % — AB (ref 36.0–46.0)
HEMOGLOBIN: 9 g/dL — AB (ref 12.0–15.0)
LYMPHS PCT: 19 %
Lymphs Abs: 2.5 10*3/uL (ref 0.7–4.0)
MCH: 34.9 pg — ABNORMAL HIGH (ref 26.0–34.0)
MCHC: 34.7 g/dL (ref 30.0–36.0)
MCV: 100.4 fL — AB (ref 78.0–100.0)
MONO ABS: 1.3 10*3/uL — AB (ref 0.1–1.0)
MONOS PCT: 10 %
NEUTROS PCT: 70 %
Neutro Abs: 9.1 10*3/uL — ABNORMAL HIGH (ref 1.7–7.7)
Platelets: 88 10*3/uL — ABNORMAL LOW (ref 150–400)
RBC: 2.58 MIL/uL — ABNORMAL LOW (ref 3.87–5.11)
RDW: 19.2 % — ABNORMAL HIGH (ref 11.5–15.5)
WBC: 13 10*3/uL — ABNORMAL HIGH (ref 4.0–10.5)

## 2016-01-15 LAB — URINALYSIS, ROUTINE W REFLEX MICROSCOPIC
GLUCOSE, UA: NEGATIVE mg/dL
HGB URINE DIPSTICK: NEGATIVE
KETONES UR: NEGATIVE mg/dL
Nitrite: POSITIVE — AB
PH: 5.5 (ref 5.0–8.0)
Protein, ur: NEGATIVE mg/dL
Specific Gravity, Urine: 1.014 (ref 1.005–1.030)

## 2016-01-15 LAB — URINE MICROSCOPIC-ADD ON

## 2016-01-15 LAB — I-STAT CG4 LACTIC ACID, ED: Lactic Acid, Venous: 3.42 mmol/L (ref 0.5–1.9)

## 2016-01-15 LAB — BRAIN NATRIURETIC PEPTIDE: B Natriuretic Peptide: 115.6 pg/mL — ABNORMAL HIGH (ref 0.0–100.0)

## 2016-01-15 MED ORDER — DEXTROSE 5 % IV SOLN
2.0000 g | Freq: Once | INTRAVENOUS | Status: AC
  Administered 2016-01-15: 2 g via INTRAVENOUS
  Filled 2016-01-15: qty 2

## 2016-01-15 MED ORDER — VANCOMYCIN HCL IN DEXTROSE 1-5 GM/200ML-% IV SOLN
1000.0000 mg | Freq: Once | INTRAVENOUS | Status: AC
  Administered 2016-01-15: 1000 mg via INTRAVENOUS
  Filled 2016-01-15: qty 200

## 2016-01-15 MED ORDER — HYDROMORPHONE HCL 1 MG/ML IJ SOLN
0.5000 mg | Freq: Once | INTRAMUSCULAR | Status: AC
Start: 2016-01-15 — End: 2016-01-15
  Administered 2016-01-15: 0.5 mg via INTRAVENOUS

## 2016-01-15 MED ORDER — HYDROMORPHONE HCL 1 MG/ML IJ SOLN
1.0000 mg | Freq: Once | INTRAMUSCULAR | Status: AC
  Administered 2016-01-15: 1 mg via INTRAVENOUS
  Filled 2016-01-15: qty 1

## 2016-01-15 MED ORDER — SODIUM CHLORIDE 0.9 % IV BOLUS (SEPSIS)
1000.0000 mL | Freq: Once | INTRAVENOUS | Status: AC
  Administered 2016-01-15: 1000 mL via INTRAVENOUS

## 2016-01-15 MED ORDER — ALBUTEROL SULFATE (2.5 MG/3ML) 0.083% IN NEBU
5.0000 mg | INHALATION_SOLUTION | Freq: Once | RESPIRATORY_TRACT | Status: AC
  Administered 2016-01-15: 5 mg via RESPIRATORY_TRACT
  Filled 2016-01-15: qty 6

## 2016-01-15 MED ORDER — HYDROMORPHONE HCL 1 MG/ML IJ SOLN
0.5000 mg | Freq: Once | INTRAMUSCULAR | Status: DC
  Filled 2016-01-15: qty 1

## 2016-01-15 MED ORDER — SODIUM CHLORIDE 0.9 % IV BOLUS (SEPSIS)
250.0000 mL | Freq: Once | INTRAVENOUS | Status: AC
Start: 2016-01-15 — End: 2016-01-15
  Administered 2016-01-15: 250 mL via INTRAVENOUS

## 2016-01-15 NOTE — ED Provider Notes (Signed)
[facility]-EMERGENCY DEPT Provider Note   CSN: [id number] Arrival date & time: 2016/02/10  2057     History   Chief Complaint Chief Complaint  Patient presents with  . Shortness of Breath    recent hx of pneumonia with shortness of breath for 3 days     HPI Kristina Morris is a 50 y.o. female presenting with dyspnea. History is somewhat limited as she is currently on CPAP. Patient states she's been short of breath for several days. She was diagnosed with pneumonia couple days ago in this emergency department. Discharged on azithromycin. Worsening shortness of breath and left-sided chest pain. EMS noted her oxygen saturation to be 88% on room air and was not coming up with nasal cannula. She was also tachycardic and so they placed her on CPAP. Patient notes some improvement on CPAP. Denies fevers. She has a history of cirrhosis with ascites and bilateral lower she may swelling. She states that she has been on Lasix and her lower extremity swelling is better. Has abdominal distention but states is not as bad as typical. She does not think her abdominal distention is causing her dyspnea. There is no difference in her dyspnea when laying flat or sitting up.  HPI  Past Medical History:  Diagnosis Date  . Alcoholic (HCC)   . Chicken pox   . Depression   . Fatty liver   . Hyperlipidemia   . Mitral valve prolapse   . Seasonal allergies     Patient Active Problem List   Diagnosis Date Noted  . Sepsis (HCC) 02/10/16  . Alcoholic hepatitis with ascites 11/03/2015  . Jaundice 11/03/2015  . Hypomagnesemia 11/02/2015  . Anemia of chronic disease 10/29/2015  . Elevated INR 10/29/2015  . Ascites 10/29/2015  . Thrombocytopenia (HCC) 10/29/2015  . Cirrhosis (HCC) 10/27/2015  . Fall on same level as cause of accidental injury 02/09/2015  . Rib pain on left side 02/09/2015  . Posttraumatic hematoma of right breast 02/09/2015  . Spinal stenosis in cervical region 12/24/2014  . Cervicogenic headache  12/24/2014  . Hyperreflexia 12/04/2014  . New onset seizure (HCC) 12/02/2014  . Hypokalemia 12/02/2014  . Elevated BP 10/20/2013  . EtOH dependence (HCC) 10/20/2013  . Depression with anxiety 10/20/2013  . Fatty liver 10/20/2013  . Other and unspecified hyperlipidemia 10/20/2013    Past Surgical History:  Procedure Laterality Date  . cysts removal, sinus    . OVARIAN CYST REMOVAL    . TONSILLECTOMY AND ADENOIDECTOMY    . 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 Authorizing Provider  azithromycin (ZITHROMAX) 250 MG tablet Take 1 tablet (250 mg total) by mouth daily. Take first 2 tablets together, then 1 every day until finished. 01/10/16   Raeford Razor, MD  b complex vitamins tablet Take 1 tablet by mouth 2 (two) times a week. Reported on 11/02/2015    Historical Provider, MD  BIOTIN PO Take 10,000 mcg by mouth daily. Reported on 11/02/2015    Historical Provider, MD  calcium carbonate (OS-CAL) 600 MG TABS tablet Take 1 tablet (600 mg total) by mouth 2 (two) times daily with a meal. Reported on 11/02/2015 01/07/16   Bradd Canary, MD  cyclobenzaprine (FLEXERIL) 5 MG tablet take 1 tablet by mouth at bedtime 12/15/15   Sheliah Hatch, MD  EPINEPHrine 0.3 mg/0.3 mL IJ SOAJ injection Inject 0.3 mg into the muscle as needed. Reported on 11/02/2015    Historical Provider, MD  fexofenadine (ALLEGRA) 180 MG tablet Take 180 mg by mouth as needed for allergies. Reported on 11/02/2015    Historical Provider, MD  fluticasone (FLONASE) 50 MCG/ACT nasal spray Place 2 sprays into both nostrils daily as needed for allergies. Reported on 11/02/2015    Historical Provider, MD  folic acid (FOLVITE) 800 MCG tablet Take 800 mcg by mouth daily.    Historical Provider, MD  furosemide (LASIX) 20 MG tablet Take 2 tablets (40 mg total) by mouth daily. 10/30/15   Calvert Cantor, MD  GILDESS FE 1/20  1-20 MG-MCG tablet Take 1 tablet by mouth daily. Reported on 11/02/2015 08/20/13   Historical Provider, MD  hydrOXYzine (VISTARIL) 25 MG capsule 1 tab Q4-6 prn and nightly for sleep Patient not taking: Reported on 11/02/2015 09/28/14   Sheliah Hatch, MD  ibuprofen (ADVIL,MOTRIN) 200 MG tablet Take 200 mg by mouth every 6 (six) hours as needed for moderate pain. Reported on 11/02/2015    Historical Provider, MD  Multiple Vitamin (DAILY MULTIVITAMIN PO) Take 1 tablet by mouth daily.    Historical Provider, MD  sertraline (ZOLOFT) 100 MG tablet Take 1 tablet (100 mg total) by mouth every evening. Patient not taking: Reported on 11/02/2015 02/01/15   Sheliah Hatch, MD  spironolactone (ALDACTONE) 50 MG tablet Take 1 tablet (50 mg total) by mouth daily. Patient not taking: Reported on 11/02/2015 10/31/15   Calvert Cantor, MD  sulfamethoxazole-trimethoprim (BACTRIM,SEPTRA) 400-80 MG tablet Take 1 tablet by mouth daily. 11/20/15   Historical Provider, MD  traMADol (ULTRAM) 50 MG tablet take 1 tablet by mouth every 12 hours if needed 12/27/15   Sandford Craze, NP  vitamin E 400 UNIT capsule Take 400 Units by mouth daily. Reported on 11/02/2015    Historical Provider, MD    Family History Family History  Problem Relation Age of Onset  . Liver disease Brother     older brother  . Depression Mother   . Breast cancer Mother   . Osteoporosis Mother   . Liver disease Maternal Aunt   . Liver disease Paternal Aunt   . Alcoholism      both sides of family  . Diabetes Brother   . Hypertension Brother     older brother  . Cancer      both sides of family  . Heart disease Father   . Heart disease Maternal Grandmother     Social History Social History  Substance Use Topics  . Smoking status: Current Some Day Smoker    Types: Cigarettes  . Smokeless tobacco: Never Used     Comment: Smoke about 1-2 times a week  . Alcohol use 0.0 oz/week     Comment: Patient states she has not drank since  February/2017     Allergies   Avelox [moxifloxacin hcl in nacl]; Bee venom; Doxycycline; Amoxicillin; Erythromycin; and Sulfa antibiotics   Review of Systems Review of Systems  Constitutional: Negative for fever.  Respiratory: Positive for shortness of breath. Negative for cough.   Cardiovascular: Positive for chest pain. Negative for leg swelling.  Gastrointestinal: Positive for abdominal distention. Negative for abdominal pain.  All other systems reviewed and are negative.    Physical Exam Updated Vital Signs BP 116/78 (BP Location: Right Arm)   Pulse 119   Temp 98.9 F (37.2 C) (Oral)   Resp 20  Ht 5\' 6"  (1.676 m)   Wt 152 lb (68.9 kg)   SpO2 94%   BMI 24.53 kg/m   Physical Exam  Constitutional: She is oriented to person, place, and time. She appears well-developed and well-nourished.  HENT:  Head: Normocephalic and atraumatic.  Right Ear: External ear normal.  Left Ear: External ear normal.  Nose: Nose normal.  Eyes: Right eye exhibits no discharge. Left eye exhibits no discharge.  jaundiced  Cardiovascular: Regular rhythm and normal heart sounds.  Tachycardia present.   Pulmonary/Chest: Accessory muscle usage present. Tachypnea noted. She has decreased breath sounds in the right lower field and the left lower field. She has rales. She exhibits tenderness.  Abdominal: Soft. She exhibits distension. There is no tenderness.  Musculoskeletal: She exhibits no edema.  Neurological: She is alert and oriented to person, place, and time.  Skin: Skin is warm and dry.  Jaundiced face  Nursing note and vitals reviewed.    ED Treatments / Results  Labs (all labs ordered are listed, but only abnormal results are displayed) Labs Reviewed  CBC WITH DIFFERENTIAL/PLATELET - Abnormal; Notable for the following:       Result Value   WBC 13.0 (*)    RBC 2.58 (*)    Hemoglobin 9.0 (*)    HCT 25.9 (*)    MCV 100.4 (*)    MCH 34.9 (*)    RDW 19.2 (*)    Platelets 88 (*)     Neutro Abs 9.1 (*)    Monocytes Absolute 1.3 (*)    All other components within normal limits  COMPREHENSIVE METABOLIC PANEL - Abnormal; Notable for the following:    Sodium 133 (*)    Potassium 3.2 (*)    Chloride 97 (*)    Calcium 8.1 (*)    Albumin 2.6 (*)    AST 82 (*)    Alkaline Phosphatase 159 (*)    Total Bilirubin 16.6 (*)    All other components within normal limits  URINALYSIS, ROUTINE W REFLEX MICROSCOPIC (NOT AT [facility]) - Abnormal; Notable for the following:    Color, Urine ORANGE (*)    APPearance CLOUDY (*)    Bilirubin Urine LARGE (*)    Nitrite POSITIVE (*)    Leukocytes, UA TRACE (*)    All other components within normal limits  BRAIN NATRIURETIC PEPTIDE - Abnormal; Notable for the following:    B Natriuretic Peptide 115.6 (*)    All other components within normal limits  URINE MICROSCOPIC-ADD ON - Abnormal; Notable for the following:    Squamous Epithelial / LPF 0-5 (*)    Bacteria, UA MANY (*)    Casts HYALINE CASTS (*)    All other components within normal limits  I-STAT CG4 LACTIC ACID, ED - Abnormal; Notable for the following:    Lactic Acid, Venous 3.42 (*)    All other components within normal limits  CULTURE, BLOOD (ROUTINE X 2)  CULTURE, BLOOD (ROUTINE X 2)  URINE CULTURE    EKG  EKG Interpretation  Date/Time:  Saturday 2016/01/21 21:02:38 EDT Ventricular Rate:  126 PR Interval:    QRS Duration: 76 QT Interval:  321 QTC Calculation: 465 R Axis:   85 Text Interpretation:  Sinus tachycardia Borderline low voltage, extremity leads similar to Jan 10 2016 Confirmed by Criss Alvine MD, Isabela Nardelli [phone]) on 21-Jan-2016 10:10:24 PM       Radiology Dg Chest 2 View  Result Date: 01/21/2016 CLINICAL DATA:  Acute onset of shortness of  breath. Initial encounter. EXAM: CHEST  2 VIEW COMPARISON:  Chest radiograph performed 01/10/2016 FINDINGS: Bilateral apical airspace opacification is noted, concerning for multifocal pneumonia. Small bilateral pleural  effusions are noted. No pneumothorax is seen. The heart is borderline enlarged. No acute osseous abnormalities are identified. IMPRESSION: Biapical airspace opacification, concerning for significantly worsening multifocal pneumonia. Small bilateral pleural effusions noted. Borderline cardiomegaly. Electronically Signed   By: Roanna RaiderJeffery  Chang M.D.   On: 02/04/2016 21:40    Procedures Procedures (including critical care time)  CRITICAL CARE Performed by: Pricilla LovelessGOLDSTON, Esmerelda Finnigan T   Total critical care time: 30 minutes  Critical care time was exclusive of separately billable procedures and treating other patients.  Critical care was necessary to treat or prevent imminent or life-threatening deterioration.  Critical care was time spent personally by me on the following activities: development of treatment plan with patient and/or surrogate as well as nursing, discussions with consultants, evaluation of patient's response to treatment, examination of patient, obtaining history from patient or surrogate, ordering and performing treatments and interventions, ordering and review of laboratory studies, ordering and review of radiographic studies, pulse oximetry and re-evaluation of patient's condition.   Medications Ordered in ED Medications  albuterol (PROVENTIL) (2.5 MG/3ML) 0.083% nebulizer solution 5 mg (5 mg Nebulization Given 01/22/2016 2133)  HYDROmorphone (DILAUDID) injection 1 mg (1 mg Intravenous Given 02/03/2016 2134)  ceFEPIme (MAXIPIME) 2 g in dextrose 5 % 50 mL IVPB (0 g Intravenous Stopped 02/05/2016 2232)  vancomycin (VANCOCIN) IVPB 1000 mg/200 mL premix (0 mg Intravenous Stopped 01/24/2016 2308)  sodium chloride 0.9 % bolus 1,000 mL (0 mLs Intravenous Stopped 01/18/2016 2311)    And  sodium chloride 0.9 % bolus 1,000 mL (0 mLs Intravenous Stopped 02/11/2016 2309)    And  sodium chloride 0.9 % bolus 250 mL (0 mLs Intravenous Stopped 01/14/2016 2233)  HYDROmorphone (DILAUDID) injection 0.5 mg (0.5 mg Intravenous Given  01/24/2016 2236)     Initial Impression / Assessment and Plan / ED Course  I have reviewed the triage vital signs and the nursing notes.  Pertinent labs & imaging results that were available during my care of the patient were reviewed by me and considered in my medical decision making (see chart for details).  Clinical Course  Comment By Time  Bilateral rales on exam. CHF vs PNA. Will check CXR, labs. Eval lactate and temperature. Will try off bipap at this time Pricilla LovelessScott Kitzia Camus, MD 09/02 2124  Lactate is 3.4, and patient's Xray has diffuse infiltrates. Admitted just under 3 months ago. Will give HCAP antibiotics, call code sepsis, give IV fluids. Pricilla LovelessScott Fields Oros, MD 09/02 2139  She appears improved. Maintaining O2 sats on [place]. Work of breathing is better but still seems to be splinting. She indicates this is because of pain but her dyspnea is much better after a breathing treatment.  Will continue to closely monitor Pricilla LovelessScott Autie Vasudevan, MD 09/02 2210  Patient appears in some pain but overall reports improvement. Will need admission. At this point she is improving and I do not think she needs intubation or airway intervention at this time. Pricilla LovelessScott Azaela Caracci, MD 09/02 2302  Dr. Maryfrances Bunnellanford to admit to stepdown. Pricilla LovelessScott Sussan Meter, MD 09/02 2322    Final Clinical Impressions(s) / ED Diagnoses   Final diagnoses:  Sepsis, due to unspecified organism (HCC)  HCAP (healthcare-associated pneumonia)  Hypoxia    New Prescriptions New Prescriptions   No medications on file     Pricilla LovelessScott Kenna Kirn, MD 01/16/16 0007

## 2016-01-15 NOTE — Progress Notes (Signed)
Pt arrived via ems on CPAP.  Pt discussed with MD Goldston in Pt room and decision made to try Pt off CPAP and not place Pt on BiPAP as of yet.  Pt currently tolerating 6 Lpm nasal cannula at this time.  RT will continue to monitor as needed.

## 2016-01-15 NOTE — ED Triage Notes (Signed)
Shortness of breath for 3 days with hx of recent pneumonia still on antibiotics of zithromax.

## 2016-01-15 NOTE — ED Notes (Signed)
Bed: WA04 Expected date:  Expected time:  Means of arrival:  Comments: 50 yo F  Shortness of breath CPAP

## 2016-01-15 NOTE — ED Notes (Signed)
No respiratory or acute distress noted alert and oriented x 3 call light in reach no reaction to medication noted. 

## 2016-01-16 ENCOUNTER — Encounter (HOSPITAL_COMMUNITY): Payer: Self-pay | Admitting: Family Medicine

## 2016-01-16 DIAGNOSIS — E871 Hypo-osmolality and hyponatremia: Secondary | ICD-10-CM

## 2016-01-16 DIAGNOSIS — E876 Hypokalemia: Secondary | ICD-10-CM

## 2016-01-16 DIAGNOSIS — A419 Sepsis, unspecified organism: Principal | ICD-10-CM

## 2016-01-16 DIAGNOSIS — K7031 Alcoholic cirrhosis of liver with ascites: Secondary | ICD-10-CM

## 2016-01-16 LAB — COMPREHENSIVE METABOLIC PANEL
ALK PHOS: 140 U/L — AB (ref 38–126)
ALT: 32 U/L (ref 14–54)
ANION GAP: 8 (ref 5–15)
AST: 72 U/L — ABNORMAL HIGH (ref 15–41)
Albumin: 2.4 g/dL — ABNORMAL LOW (ref 3.5–5.0)
BUN: 12 mg/dL (ref 6–20)
CALCIUM: 8 mg/dL — AB (ref 8.9–10.3)
CHLORIDE: 101 mmol/L (ref 101–111)
CO2: 25 mmol/L (ref 22–32)
Creatinine, Ser: 0.53 mg/dL (ref 0.44–1.00)
GFR calc non Af Amer: >60 mL/min (ref >60)
Glucose, Bld: 119 mg/dL — ABNORMAL HIGH (ref 65–99)
POTASSIUM: 3.5 mmol/L (ref 3.5–5.1)
SODIUM: 134 mmol/L — AB (ref 135–145)
Total Bilirubin: 15.5 mg/dL — ABNORMAL HIGH (ref 0.3–1.2)
Total Protein: 5.9 g/dL — ABNORMAL LOW (ref 6.5–8.1)

## 2016-01-16 LAB — BLOOD GAS, ARTERIAL
ACID-BASE DEFICIT: 0.4 mmol/L (ref 0.0–2.0)
BICARBONATE: 24.5 mmol/L (ref 20.0–28.0)
DRAWN BY: 11249
O2 CONTENT: 4 L/min
O2 SAT: 86.2 %
PATIENT TEMPERATURE: 97.8
pCO2 arterial: 43.7 mmHg (ref 32.0–48.0)
pH, Arterial: 7.365 (ref 7.350–7.450)
pO2, Arterial: 58.7 mmHg — ABNORMAL LOW (ref 83.0–108.0)

## 2016-01-16 LAB — CBC
HCT: 23.4 % — ABNORMAL LOW (ref 36.0–46.0)
HEMOGLOBIN: 8 g/dL — AB (ref 12.0–15.0)
MCH: 34.3 pg — AB (ref 26.0–34.0)
MCHC: 34.2 g/dL (ref 30.0–36.0)
MCV: 100.4 fL — ABNORMAL HIGH (ref 78.0–100.0)
Platelets: 73 10*3/uL — ABNORMAL LOW (ref 150–400)
RBC: 2.33 MIL/uL — AB (ref 3.87–5.11)
RDW: 19.3 % — ABNORMAL HIGH (ref 11.5–15.5)
WBC: 11.8 10*3/uL — ABNORMAL HIGH (ref 4.0–10.5)

## 2016-01-16 LAB — I-STAT CG4 LACTIC ACID, ED: Lactic Acid, Venous: 1.58 mmol/L (ref 0.5–1.9)

## 2016-01-16 LAB — PROTIME-INR
INR: 2.24
PROTHROMBIN TIME: 25.2 s — AB (ref 11.4–15.2)

## 2016-01-16 LAB — STREP PNEUMONIAE URINARY ANTIGEN: Strep Pneumo Urinary Antigen: NEGATIVE

## 2016-01-16 LAB — LACTIC ACID, PLASMA
LACTIC ACID, VENOUS: 1.5 mmol/L (ref 0.5–1.9)
Lactic Acid, Venous: 1.7 mmol/L (ref 0.5–1.9)

## 2016-01-16 LAB — MRSA PCR SCREENING: MRSA by PCR: NEGATIVE

## 2016-01-16 MED ORDER — HYDROMORPHONE HCL 1 MG/ML IJ SOLN
0.5000 mg | INTRAMUSCULAR | Status: DC | PRN
  Administered 2016-01-16 – 2016-01-18 (×10): 1 mg via INTRAVENOUS
  Filled 2016-01-16 (×10): qty 1

## 2016-01-16 MED ORDER — ONDANSETRON HCL 4 MG/2ML IJ SOLN: 4.0000 mg | Freq: Four times a day (QID) | INTRAMUSCULAR | Status: DC | PRN

## 2016-01-16 MED ORDER — SODIUM CHLORIDE 0.9% FLUSH
3.0000 mL | Freq: Two times a day (BID) | INTRAVENOUS | Status: DC
Start: 2016-01-16 — End: 2016-01-24
  Administered 2016-01-16 – 2016-01-23 (×13): 3 mL via INTRAVENOUS

## 2016-01-16 MED ORDER — SERTRALINE HCL 100 MG PO TABS
100.0000 mg | ORAL_TABLET | Freq: Every evening | ORAL | Status: DC
  Administered 2016-01-16 – 2016-01-18 (×3): 100 mg via ORAL
  Filled 2016-01-16 (×3): qty 1

## 2016-01-16 MED ORDER — FOLIC ACID 1 MG PO TABS
1.0000 mg | ORAL_TABLET | Freq: Every day | ORAL | Status: DC
  Administered 2016-01-16 – 2016-01-18 (×3): 1 mg via ORAL
  Filled 2016-01-16 (×3): qty 1

## 2016-01-16 MED ORDER — ALBUTEROL SULFATE (2.5 MG/3ML) 0.083% IN NEBU: 2.5000 mg | INHALATION_SOLUTION | RESPIRATORY_TRACT | Status: DC | PRN

## 2016-01-16 MED ORDER — ACETAMINOPHEN 650 MG RE SUPP: 650.0000 mg | Freq: Four times a day (QID) | RECTAL | Status: DC | PRN

## 2016-01-16 MED ORDER — VANCOMYCIN HCL IN DEXTROSE 750-5 MG/150ML-% IV SOLN
750.0000 mg | Freq: Two times a day (BID) | INTRAVENOUS | Status: DC
  Administered 2016-01-16: 750 mg via INTRAVENOUS
  Filled 2016-01-16 (×2): qty 150

## 2016-01-16 MED ORDER — PNEUMOCOCCAL VAC POLYVALENT 25 MCG/0.5ML IJ INJ
0.5000 mL | INJECTION | INTRAMUSCULAR | Status: AC
Start: 2016-01-17 — End: 2016-01-17
  Administered 2016-01-17: 0.5 mL via INTRAMUSCULAR
  Filled 2016-01-16 (×2): qty 0.5

## 2016-01-16 MED ORDER — ACETAMINOPHEN 325 MG PO TABS: 650.0000 mg | ORAL_TABLET | Freq: Four times a day (QID) | ORAL | Status: DC | PRN

## 2016-01-16 MED ORDER — DEXTROSE 5 % IV SOLN
1.0000 g | Freq: Three times a day (TID) | INTRAVENOUS | Status: DC
  Administered 2016-01-16 – 2016-01-18 (×7): 1 g via INTRAVENOUS
  Filled 2016-01-16 (×8): qty 1

## 2016-01-16 MED ORDER — POTASSIUM CHLORIDE CRYS ER 20 MEQ PO TBCR
40.0000 meq | EXTENDED_RELEASE_TABLET | Freq: Once | ORAL | Status: AC
  Administered 2016-01-16: 40 meq via ORAL
  Filled 2016-01-16: qty 2

## 2016-01-16 MED ORDER — ONDANSETRON HCL 4 MG PO TABS: 4.0000 mg | ORAL_TABLET | Freq: Four times a day (QID) | ORAL | Status: DC | PRN

## 2016-01-16 NOTE — Progress Notes (Signed)
eLink Physician-Brief Progress Note Patient Name: Kristina Morris DOB: 11/29/1965 MRN: [id number]   Date of Service  01/16/2016  HPI/Events of Note  Follow-up with patient. No issues overnight noted.   Complaining of pain. On and off drowsiness. 110/53, 118, 28-30,  95% ON 4l.   Lactic acid is now normal at 1.7.  abg 7.37, 44, 59 on 4 L. O2 sats were 86%  A> multilobar pneumonia. Likely aspiration. Liver cirrhosis..    WILL  Let [facility] determine if official PCCM consult is needed.         Jose Bridgette Habermannngelo A De Dios 01/16/2016, 6:55 AM

## 2016-01-16 NOTE — Progress Notes (Signed)
eLink Physician-Brief Progress Note Patient Name: Kristina CashLaura J Knoles DOB: 07-28-1965 MRN: [id number]   Date of Service  01/16/2016  HPI/Events of Note  Dr. Maryfrances Bunnellanford requested patient to be seen once she is admitted at stepdown unit.  Patient with alcoholic cirrhosis, admitted for multilobar pneumonia. Camerad in the room. Spoke with the patient and nurse. Patient seen, comfortable,  Complaining of being sore all over. Not in distress. Denies cough, chest pain. No fever. Blood pressure 128/56, heart rate 120, respiratory rate of 24, sats 94% on 2 L.  Patient is received 2.2 L at the emergency room.  Labs reviewed. Lactic acid improved.  1.58 from 3.42 at baseline.    eICU Interventions  Continue present management. Continue broad-spectrum antibiotics.  Will observe for now.  Suggest albumin infusion if further volume resuscitation will be needed. Keep in  stepdown unit for now.         Brance Dartt [id number] CourtAngelo A De Dios 01/16/2016, 1:19 AM

## 2016-01-16 NOTE — Progress Notes (Signed)
Flutter valve done with patient, pt tolerated it well no distress or complications noted.

## 2016-01-16 NOTE — H&P (Signed)
History and Physical  Patient Name: Kristina Morris     ZOX:[id number]    DOB: 1966/01/15    DOA: 01/31/2016 PCP: Neena RhymesKatherine Tabori, MD   Patient coming from: Home  Chief Complaint: Chest pain  HPI: Kristina Morris is a 50 y.o. female with a past medical history significant for alcoholic cirrhosis with recent liver failure who presents with chest pain and worsening pneumonia.  The patient was admitted to the hospitalist service here in mid June for acute liver failure in the setting of alcoholic cirrhosis. She was seen by our GI, started on steroids, Lasix, and spironolactone, received paracentesis (no SBP) and was [facility]   Since then, she has stabilized, had well controlled leg swelling, no more ascites and completed her course of steroids.  She is abstinent from alcohol now.    About 4 days ago, she started to develop some chest pain, fever and shortness of breath.  She was seen in our ER, had a new left lower lobe infiltrate on CXR but was without hypoxia or leukocytosis and was prescribed azithromycin and discharged.  Since then, she has taken the azithromycin but feels progressively worse pain and dyspnea so today she called EMS.  EMS found her hypoxic in the high 80s, but placed CPAP because they couldn't improve her O2 sat with supplemental O2.   ED course: -Temp 99.10F, heart rate 120s, respirations 30s, BP 120s/80s, hypoxic in 80s -Na 133, K 3.2, Cr 0.5 (baseline), WBC 13K, Hgb 9 (recent baseline 8-9), platelets 88K -Lactic acid 3.4 -CXR showed worsening multifocal now bilateral apical pneumonia -Blood and urine cultures were obtained, 2.5L plus vancomycin and cefepime were administered, and TRH were asked to evaluate for admission     ROS: Review of Systems  Constitutional: Positive for fever and malaise/fatigue.  Respiratory: Positive for cough and shortness of breath.   Cardiovascular: Positive for chest pain.    Gastrointestinal: Positive for abdominal pain and nausea.       Ascites  All other systems reviewed and are negative.         Past Medical History:  Diagnosis Date  . Alcoholic (HCC)   . Chicken pox   . Cirrhosis (HCC)   . Depression   . Fatty liver   . Hyperlipidemia   . Mitral valve prolapse   . Seasonal allergies     Past Surgical History:  Procedure Laterality Date  . cysts removal, sinus    . OVARIAN CYST REMOVAL    . TONSILLECTOMY AND ADENOIDECTOMY    . 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  The patient walks unasssited.  She smokes currently.  She no longer drinks alcohol.    Allergies  Allergen Reactions  . Avelox [Moxifloxacin Hcl In Nacl] Shortness Of Breath, Itching and Rash    GI Upset  . Bee Venom Anaphylaxis  . Doxycycline Nausea Only and Rash  . Amoxicillin Rash    Upset stomach   . Erythromycin Rash    Upset stomach  . Sulfa Antibiotics Rash    Upset stomach    Family history: family history includes Breast cancer in her mother; Depression in her mother; Diabetes in her brother; Heart disease in her father and maternal grandmother; Hypertension in her brother; Liver disease in her brother, maternal aunt, and paternal aunt; Osteoporosis in her mother.  Prior to Admission medications   Medication Sig  Start Date End Date Taking? Authorizing Provider  b complex vitamins tablet Take 1 tablet by mouth 2 (two) times a week. Reported on 11/02/2015   Yes Historical Provider, MD  BIOTIN PO Take 10,000 mcg by mouth daily. Reported on 11/02/2015   Yes Historical Provider, MD  calcium carbonate (OS-CAL) 600 MG TABS tablet Take 1 tablet (600 mg total) by mouth 2 (two) times daily with a meal. Reported on 11/02/2015 01/07/16  Yes Bradd Canary, MD  cyclobenzaprine (FLEXERIL) 5 MG tablet take 1 tablet by mouth at bedtime Patient taking differently: take 1 tablet by mouth at bedtime as needed for sleep/spasms 12/15/15   Yes Sheliah Hatch, MD  EPINEPHrine 0.3 mg/0.3 mL IJ SOAJ injection Inject 0.3 mg into the muscle as needed. Reported on 11/02/2015   Yes Historical Provider, MD  fexofenadine (ALLEGRA) 180 MG tablet Take 180 mg by mouth as needed for allergies. Reported on 11/02/2015   Yes Historical Provider, MD  fluticasone (FLONASE) 50 MCG/ACT nasal spray Place 2 sprays into both nostrils daily as needed for allergies. Reported on 11/02/2015   Yes Historical Provider, MD  folic acid (FOLVITE) 800 MCG tablet Take 800 mcg by mouth daily.   Yes Historical Provider, MD  Multiple Vitamin (DAILY MULTIVITAMIN PO) Take 1 tablet by mouth daily.   Yes Historical Provider, MD  sertraline (ZOLOFT) 100 MG tablet Take 1 tablet (100 mg total) by mouth every evening. 02/01/15  Yes Sheliah Hatch, MD  spironolactone (ALDACTONE) 50 MG tablet Take 1 tablet (50 mg total) by mouth daily. 10/31/15  Yes Calvert Cantor, MD  sulfamethoxazole-trimethoprim (BACTRIM,SEPTRA) 400-80 MG tablet Take 1 tablet by mouth daily. 11/20/15  Yes Historical Provider, MD  traMADol (ULTRAM) 50 MG tablet take 1 tablet by mouth every 12 hours if needed Patient taking differently: Take 50 mg by mouth every 12 (twelve) hours as needed (for pain). take 1 tablet by mouth every 12 hours if needed 12/27/15  Yes Sandford Craze, NP  vitamin E 400 UNIT capsule Take 400 Units by mouth daily. Reported on 11/02/2015   Yes Historical Provider, MD  ibuprofen (ADVIL,MOTRIN) 200 MG tablet Take 200 mg by mouth every 6 (six) hours as needed for moderate pain. Reported on 11/02/2015    Historical Provider, MD       Physical Exam: BP 116/78 (BP Location: Right Arm)   Pulse 119   Temp 98.9 F (37.2 C) (Oral)   Resp 20   Ht 5\' 6"  (1.676 m)   Wt 68.9 kg (152 lb)   SpO2 94%   BMI 24.53 kg/m  General appearance: Thin ill-appearing adult female, alert and in distress from pain and dyspnea.  Somnolent from fentanyl, drifts off to sleep with every question. Eyes:  Icteric, conjunctiva pink, lids and lashes normal.     ENT: No nasal deformity, discharge, or epistaxis.  OP moist without lesions.   Lymph: No cervical or supraclavicular lymphadenopathy. Skin: Hot and moist.  Jaundice present.   Cardiac: Tachycardic, regular, nl S1-S2, no murmurs appreciated.  Capillary refill is brisk.  No JVD.  Minimial LE edema.  Radial and DP pulses 2+ and symmetric. Respiratory: Normal respiratory rate and rhythm.  Coarse rales in anterior fields.  No wheezes. GI: Abdomen distended with ascites, not tense.  No significant pain.   MSK: No deformities or effusions.  No clubbing/cyanosis. Neuro: Cranial nerves grossly normal.  Drifts to sleep with every question.  Oriented to person, place and time.  Speech is fluent.  Moves  all extremities equally and with normal coordination but globally weak and limited by pani.    Psych: Affect tired.  Judgment and insight appear normal.       Labs on Admission:  I have personally reviewed following labs and imaging studies: CBC:  Recent Labs Lab 01/10/16 2009 2016/02/01 2115  WBC 7.6 13.0*  NEUTROABS  --  9.1*  HGB 8.9* 9.0*  HCT 25.4* 25.9*  MCV 101.2* 100.4*  PLT 65* 88*   Basic Metabolic Panel:  Recent Labs Lab 01/10/16 2009 01-Feb-2016 2115  NA 134* 133*  K 3.1* 3.2*  CL 100* 97*  CO2 25 24  GLUCOSE 136* 93  BUN 11 11  CREATININE 0.61 0.50  CALCIUM 8.2* 8.1*   GFR: Estimated Creatinine Clearance: 78.8 mL/min (by C-G formula based on SCr of 0.8 mg/dL).  Liver Function Tests:  Recent Labs Lab 01/10/16 2009 2016-02-01 2115  AST 63* 82*  ALT 37 35  ALKPHOS 168* 159*  BILITOT 11.1* 16.6*  PROT 6.3* 6.6  ALBUMIN 2.6* 2.6*    Recent Labs Lab 01/10/16 2009  LIPASE 64*   Sepsis Labs: Lactic acid 3.42       Radiological Exams on Admission: Personally reviewed: Dg Chest 2 View  Result Date: 02/01/16 CLINICAL DATA:  Acute onset of shortness of breath. Initial encounter. EXAM: CHEST  2 VIEW  COMPARISON:  Chest radiograph performed 01/10/2016 FINDINGS: Bilateral apical airspace opacification is noted, concerning for multifocal pneumonia. Small bilateral pleural effusions are noted. No pneumothorax is seen. The heart is borderline enlarged. No acute osseous abnormalities are identified. IMPRESSION: Biapical airspace opacification, concerning for significantly worsening multifocal pneumonia. Small bilateral pleural effusions noted. Borderline cardiomegaly. Electronically Signed   By: Roanna Raider M.D.   On: 2016-02-01 21:40    EKG: Independently reviewed. Rate 126, QTc 465, sinus rhythm.    Assessment/Plan 1. Sepsis from pneumonia:  Suspected source pneumonia. Organism unknown. Patient meets criteria given tachycardia, tachypnea, fever, leukocytosis, and evidence of organ dysfunction.  Lactate 3.42 mmol/L and repeat ordered within 6 hours.  This patient is at high risk of poor outcomes with a SOFA score of 2 (at least 2 of the following clinical criteria: respiratory rate of 22/min or greater, altered mentation, or systolic blood pressure of 100 mm Hg or less).  Antibiotics delivered in the ED.    -Sepsis bundle utilized:  -Blood and urine cultures drawn  -30 ml/kg bolus given in ED, will repeat lactic acid  -Start targeted antibiotics with vancomycin and cefepime, based on suspected source of infection    -Repeat renal function and complete blood count in AM  -Code SEPSIS called to E-link  -Bolus albumin if further fluid resuscitation is needed     2. Cirrhosis:  Patient reports abdominal girth is about her baseline at admission.  No significant abdominal pain. -Continue folate -Hold spironolactone for now -Hold Bactrim for prophylaxis while on broad spectrum antibiotics  3. Anemia of chronic disease:  Stable.  Transfusion threshold 7 g/dL -Daily CBC  4. Hyponatremia:  Hypovolemic from liver disease -Monitor BMP  5. Hypokalemia:  Supplemented in ER -Trend BMP  6.  Thrombocytopenia:  Stable from previous. -Daily CBC  7. Mood disorder:  -Continue sertraline     DVT prophylaxis: SCDs  Code Status: FULL  Family Communication: None present, called to husband, left VM, no response  Disposition Plan: Anticipate admission to stepdown unit for close respiratory monitoring.  Broad spectrum antibiotics, fluid resuscitation as needed.  Monitor electrolytes and renal function.  Consults called: None Admission status: INPATIENT, stepdown    Medical decision making: Patient seen at 11:52 AM on 02/08/2016.  The patient was discussed with Dr. Criss Alvine and Dr. Christene Slates by phone. What exists of the patient's chart and outside records in [facility] were reviewed in depth.  Clinical condition: requiring close respiratory and hemodynamic monitoring.  Sepsis and multifocal pneumonia in setting of advanced liver disease.  Prognosis concerning.        Alberteen Sam [facility] Pager [phone]

## 2016-01-16 NOTE — Progress Notes (Signed)
Pharmacy Antibiotic Note  Kristina Morris is a 50 y.o. female admitted on 01/21/2016 with pneumonia.  Pharmacy has been consulted for Vancomycin, cefepime  dosing.  Plan: Vancomycin 750mg  IV every 12 hours.  Goal trough 15-20 mcg/mL.  Cefepime 1gm iv q8hr  Height: 5\' 6"  (167.6 cm) Weight: 152 lb (68.9 kg) IBW/kg (Calculated) : 59.3  Temp (24hrs), Avg:99.4 F (37.4 C), Min:98.9 F (37.2 C), Max:99.8 F (37.7 C)   Recent Labs Lab 01/10/16 2009 Apr 05, 2016 2115 Apr 05, 2016 2135  WBC 7.6 13.0*  --   CREATININE 0.61 0.50  --   LATICACIDVEN  --   --  3.42*    Estimated Creatinine Clearance: 78.8 mL/min (by C-G formula based on SCr of 0.8 mg/dL).    Allergies  Allergen Reactions  . Avelox [Moxifloxacin Hcl In Nacl] Shortness Of Breath, Itching and Rash    GI Upset  . Bee Venom Anaphylaxis  . Doxycycline Nausea Only and Rash  . Amoxicillin Rash    Upset stomach   . Erythromycin Rash    Upset stomach  . Sulfa Antibiotics Rash    Upset stomach    Antimicrobials this admission: Vancomycin 02/03/2016 >> Cefepime 01/14/2016 >>   Dose adjustments this admission: -  Microbiology results: pending  Thank you for allowing pharmacy to be a part of this patient's care.  Aleene DavidsonGrimsley Jr, Kimani Hovis Crowford 01/16/2016 12:40 AM

## 2016-01-16 NOTE — Progress Notes (Signed)
PROGRESS NOTE    Kristina Morris  ZOX:[id number] DOB: 10-Jun-1965 DOA: 01/14/2016 PCP: Neena Rhymes, MD    Brief Narrative:  50 y.o. female with a past medical history significant for alcoholic cirrhosis with recent liver failure who presents with chest pain and worsening pneumonia  Assessment & Plan:   Principal Problem:   Sepsis (HCC) - Continue Cefepime and vancomycin - f/u with blood cultures   Active Problems:   Depression with anxiety - continue sertraline, stable    Hypokalemia - resolved.    Cirrhosis (HCC) - AST/ALT stable    Anemia of chronic disease - stable. No active bleeding    Ascites   Thrombocytopenia (HCC) - stable    Hyponatremia - mild currently. And most likely due to ascites   DVT prophylaxis: scd's Code Status: Full Family Communication: spoke with husband Disposition Plan: pending improvement in condition   Consultants:   none   Procedures: none   Antimicrobials: Cefepime and Vancomycin  Subjective: Pt has no new complaints  Objective: Vitals:   01/16/16 0300 01/16/16 0400 01/16/16 0800 01/16/16 1000  BP: (!) 114/57 114/64 (!) 118/56 (!) 110/57  Pulse:      Resp: 20 20 (!) 27 (!) 24  Temp:  98 F (36.7 C) 97.8 F (36.6 C)   TempSrc:   Oral   SpO2: 97% 96% 97% 93%  Weight:      Height:        Intake/Output Summary (Last 24 hours) at 01/16/16 1305 Last data filed at 01/16/16 0934  Gross per 24 hour  Intake              150 ml  Output                0 ml  Net              150 ml   Filed Weights   02/06/2016 2100 01/31/2016 2129 01/16/16 0115  Weight: 59.9 kg (132 lb) 68.9 kg (152 lb) 72.5 kg (159 lb 13.3 oz)    Examination:  General exam: Appears calm and comfortable, in nad Respiratory system: exp wheeze, equal chest rise, poor inspiratory effort Cardiovascular system: S1 & S2 heard, RRR. No JVD, murmurs, rubs, gallops or clicks. No pedal edema. Gastrointestinal system: Abdomen is nondistended, soft and  nontender. No organomegaly or masses felt. Normal bowel sounds heard. Central nervous system: Alert and oriented. No focal neurological deficits. Extremities: Symmetric 5 x 5 power. Skin: No rashes, lesions or ulcers, on limited exam. Psychiatry: Mood & affect appropriate.   Data Reviewed: I have personally reviewed following labs and imaging studies  CBC:  Recent Labs Lab 01/10/16 2009 01/19/2016 2115 01/16/16 0405  WBC 7.6 13.0* 11.8*  NEUTROABS  --  9.1*  --   HGB 8.9* 9.0* 8.0*  HCT 25.4* 25.9* 23.4*  MCV 101.2* 100.4* 100.4*  PLT 65* 88* 73*   Basic Metabolic Panel:  Recent Labs Lab 01/10/16 2009 01/14/2016 2115 01/16/16 0405  NA 134* 133* 134*  K 3.1* 3.2* 3.5  CL 100* 97* 101  CO2 25 24 25   GLUCOSE 136* 93 119*  BUN 11 11 12   CREATININE 0.61 0.50 0.53  CALCIUM 8.2* 8.1* 8.0*   GFR: Estimated Creatinine Clearance: 85.8 mL/min (by C-G formula based on SCr of 0.8 mg/dL). Liver Function Tests:  Recent Labs Lab 01/10/16 2009 01/14/2016 2115 01/16/16 0405  AST 63* 82* 72*  ALT 37 35 32  ALKPHOS 168* 159* 140*  BILITOT 11.1* 16.6*  15.5*  PROT 6.3* 6.6 5.9*  ALBUMIN 2.6* 2.6* 2.4*    Recent Labs Lab 01/10/16 2009  LIPASE 64*   No results for input(s): AMMONIA in the last 168 hours. Coagulation Profile:  Recent Labs Lab 01/16/16 0405  INR 2.24   Cardiac Enzymes: No results for input(s): CKTOTAL, CKMB, CKMBINDEX, TROPONINI in the last 168 hours. BNP (last 3 results) No results for input(s): PROBNP in the last 8760 hours. HbA1C: No results for input(s): HGBA1C in the last 72 hours. CBG: No results for input(s): GLUCAP in the last 168 hours. Lipid Profile: No results for input(s): CHOL, HDL, LDLCALC, TRIG, CHOLHDL, LDLDIRECT in the last 72 hours. Thyroid Function Tests: No results for input(s): TSH, T4TOTAL, FREET4, T3FREE, THYROIDAB in the last 72 hours. Anemia Panel: No results for input(s): VITAMINB12, FOLATE, FERRITIN, TIBC, IRON, RETICCTPCT  in the last 72 hours. Sepsis Labs:  Recent Labs Lab 01/26/2016 2135 01/16/16 0045 01/16/16 0145 01/16/16 0405  LATICACIDVEN 3.42* 1.58 1.7 1.5    Recent Results (from the past 240 hour(s))  MRSA PCR Screening     Status: None   Collection Time: 01/16/16  1:10 AM  Result Value Ref Range Status   MRSA by PCR NEGATIVE NEGATIVE Final    Comment:        The GeneXpert MRSA Assay (FDA approved for NASAL specimens only), is one component of a comprehensive MRSA colonization surveillance program. It is not intended to diagnose MRSA infection nor to guide or monitor treatment for MRSA infections.       Radiology Studies: Dg Chest 2 View  Result Date: 01/28/2016 CLINICAL DATA:  Acute onset of shortness of breath. Initial encounter. EXAM: CHEST  2 VIEW COMPARISON:  Chest radiograph performed 01/10/2016 FINDINGS: Bilateral apical airspace opacification is noted, concerning for multifocal pneumonia. Small bilateral pleural effusions are noted. No pneumothorax is seen. The heart is borderline enlarged. No acute osseous abnormalities are identified. IMPRESSION: Biapical airspace opacification, concerning for significantly worsening multifocal pneumonia. Small bilateral pleural effusions noted. Borderline cardiomegaly. Electronically Signed   By: Roanna RaiderJeffery  Chang M.D.   On: 02/01/2016 21:40     Scheduled Meds: . ceFEPime (MAXIPIME) IV  1 g Intravenous Q8H  . folic acid  1 mg Oral Daily  . [START ON 01/17/2016] pneumococcal 23 valent vaccine  0.5 mL Intramuscular Tomorrow-1000  . sertraline  100 mg Oral QPM  . sodium chloride flush  3 mL Intravenous Q12H  . vancomycin  750 mg Intravenous Q12H   Continuous Infusions:    LOS: 1 day    Time spent: 35 min  Penny PiaVEGA, Maddy Graham, MD [facility] Pager [phone]  If 7PM-7AM, please contact night-coverage [email or web address] Password [email or web address] 01/16/2016, 1:05 PM

## 2016-01-16 NOTE — Progress Notes (Signed)
Pt had not urinated during this RN shift.  Pt stated that she had to urinate.  Pt was only able to urinate 100cc of urine.  Bladder scanned pt and it showed 319.  Informed Dr. Cena BentonVega.  ordered foley catheter.  Erick Blinksuchman, Maigen Mozingo D, RN

## 2016-01-17 LAB — URINE CULTURE: Culture: NO GROWTH

## 2016-01-17 NOTE — Progress Notes (Signed)
Pt been sinus tachycardiac 110-120s since admission.  Informed Dr. Cena BentonVega.  No changes at this time.  Erick Blinksuchman, Caylor Cerino D, RN

## 2016-01-17 NOTE — Progress Notes (Signed)
PROGRESS NOTE    Kristina Morris  ZOX:[id number] DOB: 30-Oct-1965 DOA: 2016/01/17 PCP: Neena Rhymes, MD    Brief Narrative:  50 y.o. female with a past medical history significant for alcoholic cirrhosis with recent liver failure who presents with chest pain and worsening pneumonia  Assessment & Plan:   Principal Problem:   Sepsis (HCC) - Continue Cefepime, d/c'd vancomycin secondary to negative MRSA pcr - f/u with blood cultures   Tachycardia - in context of patient with active pna. Sinus tachycadia. Will continue to monitor  Active Problems:   Depression with anxiety - continue sertraline, stable    Hypokalemia - resolved.    Cirrhosis (HCC) - AST/ALT stable    Anemia of chronic disease - stable. No active bleeding    Ascites   Thrombocytopenia (HCC) - stable    Hyponatremia - mild currently. And most likely due to ascites  DVT prophylaxis: scd's Code Status: Full Family Communication: no family at bedside Disposition Plan: pending improvement in condition   Consultants:   none   Procedures: none   Antimicrobials: Cefepime   Subjective: Pt has no new complaints. No acute issues overnight. Still having some lower chest epigastric discomfort  Objective: Vitals:   01/17/16 0341 01/17/16 0400 01/17/16 0628 01/17/16 0800  BP:  (!) 117/55    Pulse:      Resp:  (!) 24    Temp: 98.2 F (36.8 C)   98.5 F (36.9 C)  TempSrc: Oral   Oral  SpO2:  96%    Weight:   72.3 kg (159 lb 6.3 oz)   Height:        Intake/Output Summary (Last 24 hours) at 01/17/16 1052 Last data filed at 01/17/16 8119  Gross per 24 hour  Intake              150 ml  Output              360 ml  Net             -210 ml   Filed Weights   01/17/16 2129 01/16/16 0115 01/17/16 0628  Weight: 68.9 kg (152 lb) 72.5 kg (159 lb 13.3 oz) 72.3 kg (159 lb 6.3 oz)    Examination:  General exam: Appears calm and comfortable, in nad Respiratory system: exp wheeze, equal chest rise,  poor inspiratory effort Cardiovascular system: S1 & S2 heard, RRR. No JVD, murmurs, rubs, gallops or clicks. No pedal edema. Gastrointestinal system: Abdomen is nondistended, soft and nontender. No organomegaly or masses felt. Normal bowel sounds heard. Central nervous system: Alert and oriented. No focal neurological deficits. Extremities: Symmetric 5 x 5 power. Skin: No rashes, lesions or ulcers, on limited exam. Psychiatry: Mood & affect appropriate.   Data Reviewed: I have personally reviewed following labs and imaging studies  CBC:  Recent Labs Lab 01/10/16 2009 01-17-2016 2115 01/16/16 0405  WBC 7.6 13.0* 11.8*  NEUTROABS  --  9.1*  --   HGB 8.9* 9.0* 8.0*  HCT 25.4* 25.9* 23.4*  MCV 101.2* 100.4* 100.4*  PLT 65* 88* 73*   Basic Metabolic Panel:  Recent Labs Lab 01/10/16 2009 01-17-16 2115 01/16/16 0405  NA 134* 133* 134*  K 3.1* 3.2* 3.5  CL 100* 97* 101  CO2 25 24 25   GLUCOSE 136* 93 119*  BUN 11 11 12   CREATININE 0.61 0.50 0.53  CALCIUM 8.2* 8.1* 8.0*   GFR: Estimated Creatinine Clearance: 85.7 mL/min (by C-G formula based on SCr of 0.8 mg/dL).  Liver Function Tests:  Recent Labs Lab 01/10/16 2009 02/01/2016 2115 01/16/16 0405  AST 63* 82* 72*  ALT 37 35 32  ALKPHOS 168* 159* 140*  BILITOT 11.1* 16.6* 15.5*  PROT 6.3* 6.6 5.9*  ALBUMIN 2.6* 2.6* 2.4*    Recent Labs Lab 01/10/16 2009  LIPASE 64*   No results for input(s): AMMONIA in the last 168 hours. Coagulation Profile:  Recent Labs Lab 01/16/16 0405  INR 2.24   Cardiac Enzymes: No results for input(s): CKTOTAL, CKMB, CKMBINDEX, TROPONINI in the last 168 hours. BNP (last 3 results) No results for input(s): PROBNP in the last 8760 hours. HbA1C: No results for input(s): HGBA1C in the last 72 hours. CBG: No results for input(s): GLUCAP in the last 168 hours. Lipid Profile: No results for input(s): CHOL, HDL, LDLCALC, TRIG, CHOLHDL, LDLDIRECT in the last 72 hours. Thyroid Function  Tests: No results for input(s): TSH, T4TOTAL, FREET4, T3FREE, THYROIDAB in the last 72 hours. Anemia Panel: No results for input(s): VITAMINB12, FOLATE, FERRITIN, TIBC, IRON, RETICCTPCT in the last 72 hours. Sepsis Labs:  Recent Labs Lab 01/21/2016 2135 01/16/16 0045 01/16/16 0145 01/16/16 0405  LATICACIDVEN 3.42* 1.58 1.7 1.5    Recent Results (from the past 240 hour(s))  Blood Culture (routine x 2)     Status: None (Preliminary result)   Collection Time: 01/27/2016  9:15 PM  Result Value Ref Range Status   Specimen Description BLOOD RIGHT ANTECUBITAL  Final   Special Requests BOTTLES DRAWN AEROBIC AND ANAEROBIC 5CC  Final   Culture   Final    NO GROWTH < 24 HOURS [facility]    Report Status PENDING  Incomplete  Blood Culture (routine x 2)     Status: None (Preliminary result)   Collection Time: 02/05/2016  9:51 PM  Result Value Ref Range Status   Specimen Description BLOOD BLOOD LEFT FOREARM  Final   Special Requests BOTTLES DRAWN AEROBIC AND ANAEROBIC 5CC  Final   Culture   Final    NO GROWTH < 24 HOURS Performed at [facility]    Report Status PENDING  Incomplete  MRSA PCR Screening     Status: None   Collection Time: 01/16/16  1:10 AM  Result Value Ref Range Status   MRSA by PCR NEGATIVE NEGATIVE Final    Comment:        The GeneXpert MRSA Assay (FDA approved for NASAL specimens only), is one component of a comprehensive MRSA colonization surveillance program. It is not intended to diagnose MRSA infection nor to guide or monitor treatment for MRSA infections.       Radiology Studies: Dg Chest 2 View  Result Date: 02/08/2016 CLINICAL DATA:  Acute onset of shortness of breath. Initial encounter. EXAM: CHEST  2 VIEW COMPARISON:  Chest radiograph performed 01/10/2016 FINDINGS: Bilateral apical airspace opacification is noted, concerning for multifocal pneumonia. Small bilateral pleural effusions are noted. No pneumothorax is seen. The  heart is borderline enlarged. No acute osseous abnormalities are identified. IMPRESSION: Biapical airspace opacification, concerning for significantly worsening multifocal pneumonia. Small bilateral pleural effusions noted. Borderline cardiomegaly. Electronically Signed   By: Roanna RaiderJeffery  Chang M.D.   On: 01/29/2016 21:40     Scheduled Meds: . ceFEPime (MAXIPIME) IV  1 g Intravenous Q8H  . folic acid  1 mg Oral Daily  . pneumococcal 23 valent vaccine  0.5 mL Intramuscular Tomorrow-1000  . sertraline  100 mg Oral QPM  . sodium chloride flush  3 mL Intravenous Q12H  Continuous Infusions:    LOS: 2 days    Time spent: 35 min  Penny Pia, MD [facility] Pager [phone]  If 7PM-7AM, please contact night-coverage [email or web address] Password [email or web address] 01/17/2016, 10:52 AM

## 2016-01-18 ENCOUNTER — Inpatient Hospital Stay (HOSPITAL_COMMUNITY): Payer: 59

## 2016-01-18 LAB — BLOOD GAS, ARTERIAL
Acid-base deficit: 2 mmol/L (ref 0.0–2.0)
Bicarbonate: 23.4 mmol/L (ref 20.0–28.0)
DRAWN BY: 232811
FIO2: 1
O2 Saturation: 90.6 %
PCO2 ART: 45.2 mmHg (ref 32.0–48.0)
PH ART: 7.332 — AB (ref 7.350–7.450)
Patient temperature: 98.2
pO2, Arterial: 67.7 mmHg — ABNORMAL LOW (ref 83.0–108.0)

## 2016-01-18 LAB — COMPREHENSIVE METABOLIC PANEL
ALBUMIN: 2.3 g/dL — AB (ref 3.5–5.0)
ALK PHOS: 137 U/L — AB (ref 38–126)
ALT: 30 U/L (ref 14–54)
AST: 68 U/L — AB (ref 15–41)
Anion gap: 7 (ref 5–15)
BUN: 36 mg/dL — AB (ref 6–20)
CALCIUM: 8.7 mg/dL — AB (ref 8.9–10.3)
CO2: 23 mmol/L (ref 22–32)
CREATININE: 1.45 mg/dL — AB (ref 0.44–1.00)
Chloride: 101 mmol/L (ref 101–111)
GFR calc Af Amer: 48 mL/min — ABNORMAL LOW (ref >60)
GFR calc non Af Amer: 41 mL/min — ABNORMAL LOW (ref >60)
GLUCOSE: 135 mg/dL — AB (ref 65–99)
Potassium: 4.1 mmol/L (ref 3.5–5.1)
SODIUM: 131 mmol/L — AB (ref 135–145)
Total Bilirubin: 17.3 mg/dL — ABNORMAL HIGH (ref 0.3–1.2)
Total Protein: 6.3 g/dL — ABNORMAL LOW (ref 6.5–8.1)

## 2016-01-18 LAB — CBC
HEMATOCRIT: 23.6 % — AB (ref 36.0–46.0)
HEMOGLOBIN: 8.1 g/dL — AB (ref 12.0–15.0)
MCH: 34.9 pg — AB (ref 26.0–34.0)
MCHC: 34.3 g/dL (ref 30.0–36.0)
MCV: 101.7 fL — AB (ref 78.0–100.0)
PLATELETS: 81 10*3/uL — AB (ref 150–400)
RBC: 2.32 MIL/uL — ABNORMAL LOW (ref 3.87–5.11)
RDW: 19.8 % — ABNORMAL HIGH (ref 11.5–15.5)
WBC: 16.5 10*3/uL — ABNORMAL HIGH (ref 4.0–10.5)

## 2016-01-18 LAB — CBC WITH DIFFERENTIAL/PLATELET
BASOS PCT: 0 %
Basophils Absolute: 0 10*3/uL (ref 0.0–0.1)
EOS ABS: 0.1 10*3/uL (ref 0.0–0.7)
EOS PCT: 0 %
HEMATOCRIT: 24.4 % — AB (ref 36.0–46.0)
Hemoglobin: 8.2 g/dL — ABNORMAL LOW (ref 12.0–15.0)
Lymphocytes Relative: 11 %
Lymphs Abs: 1.7 10*3/uL (ref 0.7–4.0)
MCH: 34.3 pg — ABNORMAL HIGH (ref 26.0–34.0)
MCHC: 33.6 g/dL (ref 30.0–36.0)
MCV: 102.1 fL — ABNORMAL HIGH (ref 78.0–100.0)
MONO ABS: 1.1 10*3/uL — AB (ref 0.1–1.0)
MONOS PCT: 7 %
NEUTROS ABS: 13.3 10*3/uL — AB (ref 1.7–7.7)
Neutrophils Relative %: 82 %
Platelets: 77 10*3/uL — ABNORMAL LOW (ref 150–400)
RBC: 2.39 MIL/uL — ABNORMAL LOW (ref 3.87–5.11)
RDW: 19.6 % — AB (ref 11.5–15.5)
WBC: 16.2 10*3/uL — ABNORMAL HIGH (ref 4.0–10.5)

## 2016-01-18 LAB — BASIC METABOLIC PANEL
Anion gap: 9 (ref 5–15)
BUN: 28 mg/dL — AB (ref 6–20)
CHLORIDE: 101 mmol/L (ref 101–111)
CO2: 23 mmol/L (ref 22–32)
CREATININE: 1.39 mg/dL — AB (ref 0.44–1.00)
Calcium: 8.9 mg/dL (ref 8.9–10.3)
GFR calc Af Amer: 50 mL/min — ABNORMAL LOW (ref >60)
GFR calc non Af Amer: 43 mL/min — ABNORMAL LOW (ref >60)
GLUCOSE: 121 mg/dL — AB (ref 65–99)
POTASSIUM: 4.2 mmol/L (ref 3.5–5.1)
Sodium: 133 mmol/L — ABNORMAL LOW (ref 135–145)

## 2016-01-18 LAB — AMMONIA
Ammonia: 84 umol/L — ABNORMAL HIGH (ref 9–35)
Ammonia: 69 umol/L — ABNORMAL HIGH (ref 9–35)

## 2016-01-18 LAB — LEGIONELLA PNEUMOPHILA SEROGP 1 UR AG: L. pneumophila Serogp 1 Ur Ag: NEGATIVE

## 2016-01-18 MED ORDER — LACTULOSE ENEMA
300.0000 mL | Freq: Once | ORAL | Status: AC
  Administered 2016-01-18: 300 mL via RECTAL
  Filled 2016-01-18: qty 300

## 2016-01-18 MED ORDER — ORAL CARE MOUTH RINSE: 15.0000 mL | Freq: Two times a day (BID) | OROMUCOSAL | Status: DC

## 2016-01-18 MED ORDER — SODIUM CHLORIDE 0.9 % IV SOLN: Freq: Once | INTRAVENOUS | Status: DC

## 2016-01-18 MED ORDER — DEXTROSE-NACL 5-0.9 % IV SOLN
INTRAVENOUS | Status: DC
  Administered 2016-01-18: 09:00:00 via INTRAVENOUS

## 2016-01-18 MED ORDER — CEFEPIME HCL 1 G IJ SOLR
1.0000 g | INTRAMUSCULAR | Status: DC
  Administered 2016-01-19 – 2016-01-20 (×2): 1 g via INTRAVENOUS
  Filled 2016-01-18 (×2): qty 1

## 2016-01-18 MED ORDER — METRONIDAZOLE IN NACL 5-0.79 MG/ML-% IV SOLN
500.0000 mg | Freq: Three times a day (TID) | INTRAVENOUS | Status: DC
  Administered 2016-01-18 – 2016-01-20 (×5): 500 mg via INTRAVENOUS
  Filled 2016-01-18 (×5): qty 100

## 2016-01-18 MED ORDER — CHLORHEXIDINE GLUCONATE 0.12 % MT SOLN: 15.0000 mL | Freq: Two times a day (BID) | OROMUCOSAL | Status: DC

## 2016-01-18 MED ORDER — LACTULOSE 10 GM/15ML PO SOLN
10.0000 g | Freq: Three times a day (TID) | ORAL | Status: DC
  Administered 2016-01-18: 10 g via ORAL
  Filled 2016-01-18: qty 15

## 2016-01-18 MED ORDER — SODIUM CHLORIDE 0.9 % IV BOLUS (SEPSIS)
250.0000 mL | Freq: Once | INTRAVENOUS | Status: AC
  Administered 2016-01-18: 250 mL via INTRAVENOUS

## 2016-01-18 NOTE — Progress Notes (Addendum)
PROGRESS NOTE    Kristina Morris  ONG:[id number] DOB: Jun 12, 1965 DOA: 01/26/2016 PCP: Neena Rhymes, MD    Brief Narrative:  50 y.o. female with a past medical history significant for alcoholic cirrhosis with recent liver failure who presents with chest pain and worsening pneumonia  Assessment & Plan:   Principal Problem:   Sepsis (HCC) - Continue Cefepime, d/c'd vancomycin secondary to negative MRSA pcr - f/u with blood cultures: NGTD  AMS - could be multifactorial, early dehydration and or hepatic encephalopathy. I will order an ammonia level. Provide fluid bolus and place on MIVF with dextrose  Dehydration - normal saline fluid bolus 250 cc - MIVF with D 5   Tachycardia - in context of patient with active pna. Could be secondary to infectious etiology and early dehydration. Provide fluid bolus and administer MIVF  Active Problems:   Depression with anxiety - continue sertraline, stable    Hypokalemia - resolved.    Cirrhosis (HCC) - AST/ALT stable - assess ammonia levels    Anemia of chronic disease - stable. No active bleeding    Ascites   Thrombocytopenia (HCC) - stable    Hyponatremia - mild currently. And most likely due to ascites (Hypervolemia)  DVT prophylaxis: scd's Code Status: Full Family Communication: no family at bedside Disposition Plan: pending improvement in condition   Consultants:   none   Procedures: none   Antimicrobials: Cefepime   Subjective: Pt seems confused this am.  Objective: Vitals:   01/18/16 0500 01/18/16 0600 01/18/16 0700 01/18/16 0800  BP:  (!) 116/49  (!) 112/49  Pulse:      Resp: (!) 28 (!) 23 (!) 26 (!) 31  Temp:    98.3 F (36.8 C)  TempSrc:    Oral  SpO2: 97% 95% 94% 94%  Weight:      Height:        Intake/Output Summary (Last 24 hours) at 01/18/16 0841 Last data filed at 01/18/16 0800  Gross per 24 hour  Intake              340 ml  Output              270 ml  Net               70 ml    Filed Weights   01/17/2016 2129 01/16/16 0115 01/17/16 0628  Weight: 68.9 kg (152 lb) 72.5 kg (159 lb 13.3 oz) 72.3 kg (159 lb 6.3 oz)    Examination:  General exam: Appears calm and comfortable, in nad Respiratory system: exp wheeze, equal chest rise, poor inspiratory effort Cardiovascular system: S1 & S2 heard, RRR. No JVD, murmurs, rubs, gallops or clicks. No pedal edema. Gastrointestinal system: Abdomen is nondistended, soft and nontender. No organomegaly or masses felt. Normal bowel sounds heard. Central nervous system: Alert and awake. No focal neurological deficits. Extremities: Symmetric 5 x 5 power. Skin: No rashes, lesions or ulcers, on limited exam. Psychiatry: difficult to properly assess due to confusion and limited cooperation  Data Reviewed: I have personally reviewed following labs and imaging studies  CBC:  Recent Labs Lab 02/12/2016 2115 01/16/16 0405 01/18/16 0352  WBC 13.0* 11.8* 16.5*  NEUTROABS 9.1*  --   --   HGB 9.0* 8.0* 8.1*  HCT 25.9* 23.4* 23.6*  MCV 100.4* 100.4* 101.7*  PLT 88* 73* 81*   Basic Metabolic Panel:  Recent Labs Lab 01/19/2016 2115 01/16/16 0405 01/18/16 0352  NA 133* 134* 133*  K 3.2* 3.5  4.2  CL 97* 101 101  CO2 24 25 23   GLUCOSE 93 119* 121*  BUN 11 12 28*  CREATININE 0.50 0.53 1.39*  CALCIUM 8.1* 8.0* 8.9   GFR: Estimated Creatinine Clearance: 49.3 mL/min (by C-G formula based on SCr of 1.39 mg/dL). Liver Function Tests:  Recent Labs Lab 02/10/2016 2115 01/16/16 0405  AST 82* 72*  ALT 35 32  ALKPHOS 159* 140*  BILITOT 16.6* 15.5*  PROT 6.6 5.9*  ALBUMIN 2.6* 2.4*   No results for input(s): LIPASE, AMYLASE in the last 168 hours. No results for input(s): AMMONIA in the last 168 hours. Coagulation Profile:  Recent Labs Lab 01/16/16 0405  INR 2.24   Cardiac Enzymes: No results for input(s): CKTOTAL, CKMB, CKMBINDEX, TROPONINI in the last 168 hours. BNP (last 3 results) No results for input(s): PROBNP in  the last 8760 hours. HbA1C: No results for input(s): HGBA1C in the last 72 hours. CBG: No results for input(s): GLUCAP in the last 168 hours. Lipid Profile: No results for input(s): CHOL, HDL, LDLCALC, TRIG, CHOLHDL, LDLDIRECT in the last 72 hours. Thyroid Function Tests: No results for input(s): TSH, T4TOTAL, FREET4, T3FREE, THYROIDAB in the last 72 hours. Anemia Panel: No results for input(s): VITAMINB12, FOLATE, FERRITIN, TIBC, IRON, RETICCTPCT in the last 72 hours. Sepsis Labs:  Recent Labs Lab 01/21/2016 2135 01/16/16 0045 01/16/16 0145 01/16/16 0405  LATICACIDVEN 3.42* 1.58 1.7 1.5    Recent Results (from the past 240 hour(s))  Blood Culture (routine x 2)     Status: None (Preliminary result)   Collection Time: 02/10/2016  9:15 PM  Result Value Ref Range Status   Specimen Description BLOOD RIGHT ANTECUBITAL  Final   Special Requests BOTTLES DRAWN AEROBIC AND ANAEROBIC 5CC  Final   Culture   Final    NO GROWTH 1 DAY Performed at [facility]    Report Status PENDING  Incomplete  Urine culture     Status: None   Collection Time: 01/19/2016  9:24 PM  Result Value Ref Range Status   Specimen Description URINE, RANDOM  Final   Special Requests NONE  Final   Culture NO GROWTH Performed at [facility]   Final   Report Status 01/17/2016 FINAL  Final  Blood Culture (routine x 2)     Status: None (Preliminary result)   Collection Time: 02/09/2016  9:51 PM  Result Value Ref Range Status   Specimen Description BLOOD BLOOD LEFT FOREARM  Final   Special Requests BOTTLES DRAWN AEROBIC AND ANAEROBIC 5CC  Final   Culture   Final    NO GROWTH 1 DAY Performed at [facility]    Report Status PENDING  Incomplete  MRSA PCR Screening     Status: None   Collection Time: 01/16/16  1:10 AM  Result Value Ref Range Status   MRSA by PCR NEGATIVE NEGATIVE Final    Comment:        The GeneXpert MRSA Assay (FDA approved for NASAL specimens only), is one component of  a comprehensive MRSA colonization surveillance program. It is not intended to diagnose MRSA infection nor to guide or monitor treatment for MRSA infections.       Radiology Studies: No results found.   Scheduled Meds: . [START ON 01/19/2016] ceFEPime (MAXIPIME) IV  1 g Intravenous Q24H  . folic acid  1 mg Oral Daily  . sertraline  100 mg Oral QPM  . sodium chloride  250 mL Intravenous Once  . sodium chloride  flush  3 mL Intravenous Q12H   Continuous Infusions: . dextrose 5 % and 0.9% NaCl       LOS: 3 days    Time spent: 35 min  Penny Pia, MD [facility] Pager [phone]  If 7PM-7AM, please contact night-coverage [email or web address] Password [email or web address] 01/18/2016, 8:41 AM    Addendum: Hepatic Encephalopathy - Place on lactulose tid.  - reassess ammonia levels next am.

## 2016-01-18 NOTE — Progress Notes (Signed)
Patient's work of breathing noted to be more labored throughout shift.  Patient responsive to voice, oriented x3. Reports being short of breath. [facility] NP paged. Orders for bipap received. Will continue to monitor.

## 2016-01-18 NOTE — Progress Notes (Signed)
Pharmacy Antibiotic Note  Kristina Morris is a 50 y.o. female admitted on 01/25/2016 with pneumonia.  Pharmacy has been consulted for Vancomycin, cefepime  Dosing.  Vancomycin discontinued 9/3 as MRSA PCR negative.  Today, 01/18/2016 SCr acutely rising 0.53 > 1.39, CrCl 49 ml/min 90cc UOP recorded since midnight - foley cath placed 9/3 Afebrile WBC elevated  Plan:  Decrease cefepime to 1g IV q24h  Continue to monitor renal function closely  Height: 5\' 6"  (167.6 cm) Weight: 159 lb 6.3 oz (72.3 kg) IBW/kg (Calculated) : 59.3  Temp (24hrs), Avg:98.2 F (36.8 C), Min:97.7 F (36.5 C), Max:98.6 F (37 C)   Recent Labs Lab 02/08/2016 2115 02/12/2016 2135 01/16/16 0045 01/16/16 0145 01/16/16 0405 01/18/16 0352  WBC 13.0*  --   --   --  11.8* 16.5*  CREATININE 0.50  --   --   --  0.53 1.39*  LATICACIDVEN  --  3.42* 1.58 1.7 1.5  --     Estimated Creatinine Clearance: 49.3 mL/min (by C-G formula based on SCr of 1.39 mg/dL).    Allergies  Allergen Reactions  . Avelox [Moxifloxacin Hcl In Nacl] Shortness Of Breath, Itching and Rash    GI Upset  . Bee Venom Anaphylaxis  . Doxycycline Nausea Only and Rash  . Amoxicillin Rash    Upset stomach   . Erythromycin Rash    Upset stomach  . Sulfa Antibiotics Rash    Upset stomach    Antimicrobials this admission: 9/2 Vancomycin >> 9/3 9/2 Cefepime >>   Microbiology results: 9/3 BCx: ngtd 9/3 UCx: NGF 9/3 Sputum: sent 9/3 MRSA PCR: neg  Thank you for allowing pharmacy to be a part of this patient's care.  Loralee PacasErin Kenneth Lax, PharmD, BCPS Pager: [phone] 01/18/2016 7:45 AM

## 2016-01-18 NOTE — Progress Notes (Addendum)
Shift event: Called by Shanda BumpsJessica, RN because pt ? Aspirated taking po dose of Lactulose and desatted into the 80s. NP called RN. Per RN, pt's HOB was elevated and she "gurgled" and coughed when taking lactulose then desaturated. NRB was applied (was on 15L). Also, RR seems to have picked up and is slightly labored between 24-32 r/min. + belly breathing ? R/t ascites. NP ordered Stat CXR and ABG and went to bedside. S: Above as per RN. Also, RN says day RN said pt had become progressively more lethargic during the day. Attending ordered lactulose for high ammonia level.  Pt is unable to participate in ROS as she is non verbal.  O: Very poor appearing 50 yo WF in no acute distress. Opens eyes to tactile stimulation but is non verbal and does not follow commands. + icterus. Grimaces to painful stimulation. + tachycardic, reg rhythm. RR high 20s with abdominal breathing. Abdomen distended and grimaces with palpation. Skin is jaundice. Edematous to RUE and bilat LEs to feet/ankles.  A/P: 1. ? Aspiration during po intake. NPO. CXR shows multifocal PNA. Will cover for aspiration by adding Flagyl to Maxipime.  2. PNA with hypoxia-improved with NRB. No hx COPD on chart and PCO2 not high on ABG. Continue abx and supportive care. May need bipap. Recheck CBC with diff. 3. Encephalopathy-PCO2 normal. Most likely hepatic in origin. Given ? Full dose of lactulose, will recheck ammonia level and give a lactulose enema. May need intubation should mental status deteriorate or respiratory status worsen. Recheck CMP.  4. Hepatic cirrhosis with hx ascites. As above.  Will follow closely. Low threshold for PCCM consult and/or bipap/intubation.  KJKG, NP [facility] Update: Pt is more easily awake and is following some commands. Last ammonia level was down and RN was able to give her about 1/2 lactulose enema without further compromising pt's respiratory status. Unfortunately, pt continues to have tachypnea with belly breathing and is  now grunting with breaths. Also, pt herself endorses SOB. Will try bipap since pt more responsive.  KJKG, NP [facility] Update: Pt awoke agitated and pulling off bipap. Unable to tolerate. NP to bedside. Pt is awake and alert now.  PCCM called by RN and are at bedside now to intubate pt. PCCM will take over care. Report given to PCCM.  KJKG, NP [facility]

## 2016-01-18 NOTE — Progress Notes (Signed)
When taking lactulose PO, patient began coughing and sp02 decreased from 92% to 80%.  Patient placed on NRB (from 15L HFNC). Respiratory called to bedside to assist.  [facility] NP Craige CottaKirby paged.  Orders for ABG and chest xray placed. Sp02 increased to 91% after 5 minutes of NRB. NP to bedside to assess.  Patient lethargic but arousalable throughout. Will continue to monitor.

## 2016-01-19 ENCOUNTER — Inpatient Hospital Stay (HOSPITAL_COMMUNITY): Payer: 59

## 2016-01-19 ENCOUNTER — Encounter (HOSPITAL_COMMUNITY): Payer: Self-pay

## 2016-01-19 DIAGNOSIS — J690 Pneumonitis due to inhalation of food and vomit: Secondary | ICD-10-CM

## 2016-01-19 DIAGNOSIS — J9601 Acute respiratory failure with hypoxia: Secondary | ICD-10-CM

## 2016-01-19 DIAGNOSIS — J189 Pneumonia, unspecified organism: Secondary | ICD-10-CM

## 2016-01-19 DIAGNOSIS — R0902 Hypoxemia: Secondary | ICD-10-CM

## 2016-01-19 DIAGNOSIS — Z01818 Encounter for other preprocedural examination: Secondary | ICD-10-CM

## 2016-01-19 DIAGNOSIS — J9600 Acute respiratory failure, unspecified whether with hypoxia or hypercapnia: Secondary | ICD-10-CM

## 2016-01-19 LAB — CBC
HCT: 23.7 % — ABNORMAL LOW (ref 36.0–46.0)
Hemoglobin: 8.1 g/dL — ABNORMAL LOW (ref 12.0–15.0)
MCH: 35.1 pg — AB (ref 26.0–34.0)
MCHC: 34.2 g/dL (ref 30.0–36.0)
MCV: 102.6 fL — AB (ref 78.0–100.0)
PLATELETS: 71 10*3/uL — AB (ref 150–400)
RBC: 2.31 MIL/uL — AB (ref 3.87–5.11)
RDW: 19.9 % — AB (ref 11.5–15.5)
WBC: 16.3 10*3/uL — AB (ref 4.0–10.5)

## 2016-01-19 LAB — BLOOD GAS, ARTERIAL
ACID-BASE DEFICIT: 3.2 mmol/L — AB (ref 0.0–2.0)
Acid-base deficit: 2.9 mmol/L — ABNORMAL HIGH (ref 0.0–2.0)
Bicarbonate: 22.1 mmol/L (ref 20.0–28.0)
Bicarbonate: 22.4 mmol/L (ref 20.0–28.0)
Drawn by: 232811
Drawn by: 276051
FIO2: 100
FIO2: 70
O2 SAT: 97 %
O2 Saturation: 99.3 %
PATIENT TEMPERATURE: 97.1
PCO2 ART: 42.6 mmHg (ref 32.0–48.0)
PEEP: 5 cmH2O
PEEP: 14 cmH2O
PH ART: 7.319 — AB (ref 7.350–7.450)
PO2 ART: 139 mmHg — AB (ref 83.0–108.0)
Patient temperature: 99.1
RATE: 14 resp/min
RATE: 14 resp/min
VT: 470 mL
VT: 470 mL
pCO2 arterial: 44.3 mmHg (ref 32.0–48.0)
pH, Arterial: 7.335 — ABNORMAL LOW (ref 7.350–7.450)
pO2, Arterial: 97.4 mmHg (ref 83.0–108.0)

## 2016-01-19 LAB — GLUCOSE, CAPILLARY
GLUCOSE-CAPILLARY: 119 mg/dL — AB (ref 65–99)
GLUCOSE-CAPILLARY: 139 mg/dL — AB (ref 65–99)
Glucose-Capillary: 170 mg/dL — ABNORMAL HIGH (ref 65–99)
Glucose-Capillary: 181 mg/dL — ABNORMAL HIGH (ref 65–99)

## 2016-01-19 LAB — PROTIME-INR
INR: 2.67
PROTHROMBIN TIME: 28.9 s — AB (ref 11.4–15.2)

## 2016-01-19 LAB — LACTIC ACID, PLASMA
Lactic Acid, Venous: 1.3 mmol/L (ref 0.5–1.9)
Lactic Acid, Venous: 1.3 mmol/L (ref 0.5–1.9)

## 2016-01-19 LAB — BASIC METABOLIC PANEL
ANION GAP: 9 (ref 5–15)
Anion gap: 9 (ref 5–15)
BUN: 39 mg/dL — AB (ref 6–20)
BUN: 39 mg/dL — ABNORMAL HIGH (ref 6–20)
CALCIUM: 8.6 mg/dL — AB (ref 8.9–10.3)
CALCIUM: 8.7 mg/dL — AB (ref 8.9–10.3)
CO2: 22 mmol/L (ref 22–32)
CO2: 22 mmol/L (ref 22–32)
CREATININE: 1.5 mg/dL — AB (ref 0.44–1.00)
CREATININE: 1.46 mg/dL — AB (ref 0.44–1.00)
Chloride: 101 mmol/L (ref 101–111)
Chloride: 102 mmol/L (ref 101–111)
GFR calc non Af Amer: 40 mL/min — ABNORMAL LOW (ref >60)
GFR, EST AFRICAN AMERICAN: 46 mL/min — AB (ref >60)
GFR, EST AFRICAN AMERICAN: 47 mL/min — AB (ref >60)
GFR, EST NON AFRICAN AMERICAN: 41 mL/min — AB (ref >60)
Glucose, Bld: 123 mg/dL — ABNORMAL HIGH (ref 65–99)
Glucose, Bld: 123 mg/dL — ABNORMAL HIGH (ref 65–99)
Potassium: 4.1 mmol/L (ref 3.5–5.1)
Potassium: 4.1 mmol/L (ref 3.5–5.1)
SODIUM: 132 mmol/L — AB (ref 135–145)
SODIUM: 133 mmol/L — AB (ref 135–145)

## 2016-01-19 LAB — PHOSPHORUS
PHOSPHORUS: 5 mg/dL — AB (ref 2.5–4.6)
Phosphorus: 4.7 mg/dL — ABNORMAL HIGH (ref 2.5–4.6)
Phosphorus: 5.5 mg/dL — ABNORMAL HIGH (ref 2.5–4.6)

## 2016-01-19 LAB — TROPONIN I

## 2016-01-19 LAB — ABO/RH: ABO/RH(D): O NEG

## 2016-01-19 LAB — MAGNESIUM
MAGNESIUM: 1.7 mg/dL (ref 1.7–2.4)
MAGNESIUM: 1.7 mg/dL (ref 1.7–2.4)
Magnesium: 1.7 mg/dL (ref 1.7–2.4)

## 2016-01-19 LAB — PROCALCITONIN: PROCALCITONIN: 0.54 ng/mL

## 2016-01-19 LAB — AMMONIA: AMMONIA: 67 umol/L — AB (ref 9–35)

## 2016-01-19 LAB — APTT: APTT: 45 s — AB (ref 24–36)

## 2016-01-19 MED ORDER — FENTANYL CITRATE (PF) 100 MCG/2ML IJ SOLN
INTRAMUSCULAR | Status: AC
  Administered 2016-01-19: 100 ug
  Filled 2016-01-19: qty 4

## 2016-01-19 MED ORDER — ALBUMIN HUMAN 5 % IV SOLN
12.5000 g | Freq: Once | INTRAVENOUS | Status: AC
  Administered 2016-01-19: 12.5 g via INTRAVENOUS
  Filled 2016-01-19: qty 250

## 2016-01-19 MED ORDER — SODIUM CHLORIDE 0.9 % IV SOLN
INTRAVENOUS | Status: DC
  Administered 2016-01-19 – 2016-01-28 (×4): via INTRAVENOUS

## 2016-01-19 MED ORDER — ORAL CARE MOUTH RINSE
15.0000 mL | Freq: Four times a day (QID) | OROMUCOSAL | Status: DC
  Administered 2016-01-19 – 2016-01-31 (×46): 15 mL via OROMUCOSAL

## 2016-01-19 MED ORDER — FENTANYL CITRATE (PF) 100 MCG/2ML IJ SOLN
50.0000 ug | Freq: Once | INTRAMUSCULAR | Status: AC
  Administered 2016-01-19: 50 ug via INTRAVENOUS

## 2016-01-19 MED ORDER — FOLIC ACID 5 MG/ML IJ SOLN
1.0000 mg | Freq: Every day | INTRAMUSCULAR | Status: DC
  Administered 2016-01-19 – 2016-01-30 (×11): 1 mg via INTRAVENOUS
  Filled 2016-01-19 (×22): qty 0.2

## 2016-01-19 MED ORDER — SODIUM CHLORIDE 0.9 % IV BOLUS (SEPSIS)
500.0000 mL | Freq: Once | INTRAVENOUS | Status: AC
  Administered 2016-01-19: 500 mL via INTRAVENOUS

## 2016-01-19 MED ORDER — PANTOPRAZOLE SODIUM 40 MG PO PACK
40.0000 mg | PACK | Freq: Every day | ORAL | Status: DC
  Administered 2016-01-19: 40 mg
  Filled 2016-01-19: qty 20

## 2016-01-19 MED ORDER — ETOMIDATE 2 MG/ML IV SOLN
20.0000 mg | Freq: Once | INTRAVENOUS | Status: AC
  Administered 2016-01-19: 20 mg via INTRAVENOUS

## 2016-01-19 MED ORDER — VITAL HIGH PROTEIN PO LIQD
1000.0000 mL | ORAL | Status: DC
  Filled 2016-01-19: qty 1000

## 2016-01-19 MED ORDER — FENTANYL BOLUS VIA INFUSION
50.0000 ug | INTRAVENOUS | Status: DC | PRN
  Administered 2016-01-19: 50 ug via INTRAVENOUS
  Filled 2016-01-19: qty 50

## 2016-01-19 MED ORDER — MIDAZOLAM HCL 2 MG/2ML IJ SOLN
2.0000 mg | INTRAMUSCULAR | Status: DC | PRN
  Administered 2016-01-19 – 2016-01-20 (×2): 2 mg via INTRAVENOUS
  Filled 2016-01-19: qty 2

## 2016-01-19 MED ORDER — PRO-STAT SUGAR FREE PO LIQD
30.0000 mL | Freq: Two times a day (BID) | ORAL | Status: DC
  Administered 2016-01-19: 30 mL
  Filled 2016-01-19: qty 30

## 2016-01-19 MED ORDER — SODIUM CHLORIDE 0.9 % IV SOLN
25.0000 ug/h | INTRAVENOUS | Status: DC
  Administered 2016-01-19: 75 ug/h via INTRAVENOUS
  Administered 2016-01-19: 50 ug/h via INTRAVENOUS
  Filled 2016-01-19 (×3): qty 50

## 2016-01-19 MED ORDER — MIDAZOLAM HCL 2 MG/2ML IJ SOLN
INTRAMUSCULAR | Status: AC
  Administered 2016-01-19: 2 mg
  Filled 2016-01-19: qty 4

## 2016-01-19 MED ORDER — PHENYLEPHRINE HCL 10 MG/ML IJ SOLN
30.0000 ug/min | INTRAVENOUS | Status: DC
  Administered 2016-01-19: 170 ug/min via INTRAVENOUS
  Administered 2016-01-19: 75 ug/min via INTRAVENOUS
  Administered 2016-01-19: 95 ug/min via INTRAVENOUS
  Administered 2016-01-19: 40 ug/min via INTRAVENOUS
  Administered 2016-01-19: 150 ug/min via INTRAVENOUS
  Administered 2016-01-19: 130 ug/min via INTRAVENOUS
  Administered 2016-01-20: 110 ug/min via INTRAVENOUS
  Filled 2016-01-19 (×7): qty 1

## 2016-01-19 MED ORDER — ALBUTEROL SULFATE (2.5 MG/3ML) 0.083% IN NEBU: 2.5000 mg | INHALATION_SOLUTION | Freq: Four times a day (QID) | RESPIRATORY_TRACT | Status: DC

## 2016-01-19 MED ORDER — SODIUM CHLORIDE 0.9 % IV SOLN: Freq: Once | INTRAVENOUS | Status: AC

## 2016-01-19 MED ORDER — FUROSEMIDE 10 MG/ML IJ SOLN
60.0000 mg | Freq: Four times a day (QID) | INTRAMUSCULAR | Status: DC
  Filled 2016-01-19: qty 6

## 2016-01-19 MED ORDER — VITAL AF 1.2 CAL PO LIQD
1000.0000 mL | ORAL | Status: DC
Start: 2016-01-19 — End: 2016-01-19
  Filled 2016-01-19: qty 1000

## 2016-01-19 MED ORDER — ROCURONIUM BROMIDE 50 MG/5ML IV SOLN
50.0000 mg | Freq: Once | INTRAVENOUS | Status: AC
  Administered 2016-01-19: 50 mg via INTRAVENOUS

## 2016-01-19 MED ORDER — LEVALBUTEROL HCL 0.63 MG/3ML IN NEBU: 0.6300 mg | INHALATION_SOLUTION | RESPIRATORY_TRACT | Status: DC | PRN

## 2016-01-19 MED ORDER — CHLORHEXIDINE GLUCONATE 0.12 % MT SOLN
15.0000 mL | Freq: Two times a day (BID) | OROMUCOSAL | Status: DC
  Administered 2016-01-19 – 2016-01-30 (×23): 15 mL via OROMUCOSAL
  Filled 2016-01-19 (×6): qty 15

## 2016-01-19 MED ORDER — NOREPINEPHRINE BITARTRATE 1 MG/ML IV SOLN
0.0000 ug/min | INTRAVENOUS | Status: DC
  Administered 2016-01-19: 2 ug/min via INTRAVENOUS
  Administered 2016-01-20: 6 ug/min via INTRAVENOUS
  Filled 2016-01-19 (×3): qty 4

## 2016-01-19 MED ORDER — MIDAZOLAM HCL 2 MG/2ML IJ SOLN
2.0000 mg | INTRAMUSCULAR | Status: DC | PRN
  Administered 2016-01-19: 2 mg via INTRAVENOUS
  Filled 2016-01-19 (×2): qty 2

## 2016-01-19 MED ORDER — THIAMINE HCL 100 MG/ML IJ SOLN
100.0000 mg | Freq: Every day | INTRAMUSCULAR | Status: DC
  Administered 2016-01-19 – 2016-01-30 (×12): 100 mg via INTRAVENOUS
  Filled 2016-01-19 (×12): qty 2

## 2016-01-19 MED ORDER — LACTULOSE 10 GM/15ML PO SOLN
20.0000 g | Freq: Two times a day (BID) | ORAL | Status: DC
Start: 2016-01-19 — End: 2016-01-20
  Administered 2016-01-19 (×2): 20 g
  Filled 2016-01-19 (×2): qty 30

## 2016-01-19 MED ORDER — VITAL AF 1.2 CAL PO LIQD
1000.0000 mL | ORAL | Status: DC
  Administered 2016-01-19: 1000 mL
  Filled 2016-01-19 (×2): qty 1000

## 2016-01-19 NOTE — Progress Notes (Signed)
At 0200, patient became more alert and agitated, pulling off bipap mask.  Unable to tolerate and placed back on NRB.  Sp02 91% with increased work of breathing and retractions.  [facility] NP and Elink MD paged.  Critical care PA and MD to bedside for intubation.

## 2016-01-19 NOTE — Progress Notes (Signed)
Upon 0800 assessment patients blood pressures appear to be trending low. Md made aware of 0830 BP of 82/29 (47). Sedation decreased in case that was the cause. Per MD hold lasix for now and continue to monitor. May add orders for albumin later. All other VSS. Will continue to follow up.

## 2016-01-19 NOTE — Consult Note (Signed)
PULMONARY / CRITICAL CARE MEDICINE   Name: Kristina Morris MRN: [id number] DOB: 03-May-1966    ADMISSION DATE:  01/24/2016 CONSULTATION DATE:  01/19/16  REFERRING MD:  Cena BentonVega - [facility]  CHIEF COMPLAINT:  AMS, increased WOB  HISTORY OF PRESENT ILLNESS:  Pt is encephelopathic; therefore, this HPI is obtained from chart review. Kristina Morris is a 50 y.o. female with PMH as outlined below including but not limited to ETOH cirrhosis.  [facility] with chest pain and SOB.  She had been seen in ED 4 days prior and was found to have LLL PNA for which she was prescribed abx and discharged.  She took abx as prescribed but had worsening symptoms so came to ED for further evaluation.  In ED, she required NIMV for hypoxia and increased WOB.  She was continued on abx and lactulose.  During evening of 9/5, pt took PO lactulose and likely aspirated per RN report.  She had been on 15L O2 prior to this and after aspiration event, O2 sats dropped to 80's.  She was placed on NRB and sats gradually improved.  A few hours later, after receiving lactulose enema, she began to have improvement in her mental status; however, she had increased WOB again.  She was started on BiPAP but was unable to tolerate this.  PCCM was therefore called to evaluate pt for possible intubation.   PAST MEDICAL HISTORY :  She  has a past medical history of Alcoholic (HCC); Chicken pox; Cirrhosis (HCC); Depression; Fatty liver; Hyperlipidemia; Mitral valve prolapse; and Seasonal allergies.  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  Allergies  Allergen Reactions  . Avelox [Moxifloxacin Hcl In Nacl] Shortness Of Breath, Itching and Rash    GI Upset  . Bee Venom Anaphylaxis  . Doxycycline Nausea Only and Rash  . Amoxicillin Rash    Upset stomach   . Erythromycin Rash    Upset stomach  . Sulfa  Antibiotics Rash    Upset stomach    No current facility-administered medications on file prior to encounter.    Current Outpatient Prescriptions on File Prior to Encounter  Medication Sig  . b complex vitamins tablet Take 1 tablet by mouth 2 (two) times a week. Reported on 11/02/2015  . BIOTIN PO Take 10,000 mcg by mouth daily. Reported on 11/02/2015  . calcium carbonate (OS-CAL) 600 MG TABS tablet Take 1 tablet (600 mg total) by mouth 2 (two) times daily with a meal. Reported on 11/02/2015  . cyclobenzaprine (FLEXERIL) 5 MG tablet take 1 tablet by mouth at bedtime (Patient taking differently: take 1 tablet by mouth at bedtime as needed for sleep/spasms)  . EPINEPHrine 0.3 mg/0.3 mL IJ SOAJ injection Inject 0.3 mg into the muscle as needed. Reported on 11/02/2015  . fexofenadine (ALLEGRA) 180 MG tablet Take 180 mg by mouth as needed for allergies. Reported on 11/02/2015  . fluticasone (FLONASE) 50 MCG/ACT nasal spray Place 2 sprays into both nostrils daily as needed for allergies. Reported on 11/02/2015  . folic acid (FOLVITE) 800 MCG tablet Take 800 mcg by mouth daily.  . Multiple Vitamin (DAILY MULTIVITAMIN PO) Take 1 tablet by mouth daily.  . sertraline (ZOLOFT) 100 MG tablet Take 1 tablet (100 mg total) by mouth every evening.  Marland Kitchen. spironolactone (ALDACTONE) 50 MG tablet Take 1 tablet (50 mg total) by mouth daily.  Marland Kitchen. sulfamethoxazole-trimethoprim (  BACTRIM,SEPTRA) 400-80 MG tablet Take 1 tablet by mouth daily.  . traMADol (ULTRAM) 50 MG tablet take 1 tablet by mouth every 12 hours if needed (Patient taking differently: Take 50 mg by mouth every 12 (twelve) hours as needed (for pain). take 1 tablet by mouth every 12 hours if needed)  . vitamin E 400 UNIT capsule Take 400 Units by mouth daily. Reported on 11/02/2015  . ibuprofen (ADVIL,MOTRIN) 200 MG tablet Take 200 mg by mouth every 6 (six) hours as needed for moderate pain. Reported on 11/02/2015    FAMILY HISTORY:  Her @FAMSTP (<SUBSCRIPT>  error)@  SOCIAL HISTORY: She  reports that she has been smoking Cigarettes.  She has never used smokeless tobacco. She reports that she drinks alcohol. She reports that she does not use drugs.  REVIEW OF SYSTEMS:  Unable to obtain as pt is encephalopathic.  SUBJECTIVE:  On NRB, awake and follows basic commands.  Moaning and does not answer questions.  VITAL SIGNS: BP (!) 118/59   Pulse (!) 124   Temp 98.7 F (37.1 C) (Axillary)   Resp (!) 33   Ht 5\' 6"  (1.676 m)   Wt 159 lb 6.3 oz (72.3 kg)   SpO2 (!) 87%   BMI 25.73 kg/m   HEMODYNAMICS:    VENTILATOR SETTINGS: FiO2 (%):  [60 %-100 %] 100 %  INTAKE / OUTPUT: I/O last 3 completed shifts: In: 1133.3 [P.O.:90; I.V.:513.3; Other:130; IV Piggyback:400] Out: 570 [Urine:570]   PHYSICAL EXAMINATION: General: Middle aged female, in respiratory distress. Neuro: Awake, mumbles but does not answer questions.  Follows basic commands. HEENT: [place]/AT. PERRL, sclerael icterus noted. Cardiovascular: Tachy, regular, no M/R/G.  Lungs: Respirations shallow and labored.  Coarse bilaterally. Abdomen: Abd mildly distended.  BS x 4, soft, NT.  Musculoskeletal: No gross deformities, 1+ edema.  Skin: Jaundiced, warm, no rashes.  LABS:  BMET  Recent Labs Lab 01/16/16 0405 01/18/16 0352 01/18/16 2136  NA 134* 133* 131*  K 3.5 4.2 4.1  CL 101 101 101  CO2 25 23 23   BUN 12 28* 36*  CREATININE 0.53 1.39* 1.45*  GLUCOSE 119* 121* 135*    Electrolytes  Recent Labs Lab 01/16/16 0405 01/18/16 0352 01/18/16 2136  CALCIUM 8.0* 8.9 8.7*    CBC  Recent Labs Lab 01/16/16 0405 01/18/16 0352 01/18/16 2136  WBC 11.8* 16.5* 16.2*  HGB 8.0* 8.1* 8.2*  HCT 23.4* 23.6* 24.4*  PLT 73* 81* 77*    Coag's  Recent Labs Lab 01/16/16 0405  INR 2.24    Sepsis Markers  Recent Labs Lab 01/16/16 0045 01/16/16 0145 01/16/16 0405  LATICACIDVEN 1.58 1.7 1.5    ABG  Recent Labs Lab 01/16/16 0200 01/18/16 2025  PHART 7.365  7.332*  PCO2ART 43.7 45.2  PO2ART 58.7* 67.7*    Liver Enzymes  Recent Labs Lab 02/08/2016 2115 01/16/16 0405 01/18/16 2136  AST 82* 72* 68*  ALT 35 32 30  ALKPHOS 159* 140* 137*  BILITOT 16.6* 15.5* 17.3*  ALBUMIN 2.6* 2.4* 2.3*    Cardiac Enzymes No results for input(s): TROPONINI, PROBNP in the last 168 hours.  Glucose No results for input(s): GLUCAP in the last 168 hours.  Imaging Dg Chest Port 1 View  Result Date: 01/18/2016 CLINICAL DATA:  Hypoxia. EXAM: PORTABLE CHEST 1 VIEW COMPARISON:  Radiograph of January 15, 2016. FINDINGS: Stable cardiomediastinal silhouette. No pneumothorax is noted. Increased right upper and lower lobe opacities are noted concerning for atelectasis or possibly pneumonia. Increased diffuse lung opacity is noted  most consistent with pneumonia or possibly edema. Bony thorax is unremarkable. IMPRESSION: Increased bilateral lung opacities are noted concerning for multifocal pneumonia. Electronically Signed   By: Lupita Raider, M.D.   On: 01/18/2016 20:38     STUDIES:  CXR 9/5 > multifocal PNA.  CULTURES: Blood 9/2 > Urine 9/2 > Sputum 9/6 >  ANTIBIOTICS: Cefepime 9/2 > Flagyl 9/5 >  SIGNIFICANT EVENTS: 9/2 > admit 9/6 > PCCM called, pt intubated due to increased WOB  LINES/TUBES: ETT 9/6 >  DISCUSSION: 50 y.o. female with ETOH cirrhosis, admitted 9/2 with HCAP. Had deterioration 9/5 after aspiration event and required intubation early AM hours 9/6 due to increased WOB and hypoxia.  ASSESSMENT / PLAN:  PULMONARY A: Acute hypoxic respiratory failure. Aspiration PNA - had aspiration event following PO lactulose evening of 9/5. P:   Intubate now - full vent support. Wean as able. VAP prevention measures. SBT in AM if able. Levalbuterol PRN. Abx / cultures per ID section. CXR in AM.  NEUROLOGIC A:   Acute encephalopathy; multifactorial in setting of hepatic encephalopathy (improving with lactulose) and sepsis / aspiration  PNA. Hx depression, ETOH use (reportedly now abstinent).  P:   Sedation:  Fentanyl gtt / Midazolam PRN. RASS goal: 0 to -1. Daily WUA. Continue folic acid, thiamine for now. Hold preadmission cyclobenzaprine, sertraline, hydroxyzine.  GASTROINTESTINAL A:   Hepatic encephalopathy. Hx ETOH cirrhosis, fatty liver. Protein calorie malnutrition. GI prophylaxis. Nutrition. P:   Lactulose BID for now. Repeat lactulose in AM. SUP: Pantoprazole. NPO.  CARDIOVASCULAR A:  Sinus tachycardia. Hx HLD. P:  Monitor hemodynamics. Trend troponin, lactate. Hold preadmission furosemide, spironolactone.  RENAL A:   Hyponatremia - chronic; presumed due to ETOH. AKI. Pseudohypocalcemia - corrects to 10.06. P:   NS @ 75. BMP in AM.  HEMATOLOGIC A:   Anemia - chronic. Thrombocytopenia - chronic. VTE Prophylaxis. P:  Transfuse for Hgb < 7. Monitor platelet counts. SCD's. CBC in AM.  INFECTIOUS A:   Aspiration PNA. P:   Abx as above (cefepime / flagyl).  Follow cultures as above.  ENDOCRINE A:   No acute issues.  P:   No interventions required.   Family updated: Husband called by RN but no answer.  Interdisciplinary Family Meeting v Palliative Care Meeting:  Due by: 9/13.  CC time: 35 minutes.   Rutherford Guys, [place] - [facility] Pager: [phone]  or [phone] 01/19/2016, 2:43 AM

## 2016-01-19 NOTE — Progress Notes (Signed)
[facility] PCCM  S: intubated overnight by my partner in setting of CAP (admission) and likely aspiration pneumonia  O:  Vitals:   01/19/16 0400 01/19/16 0500 01/19/16 0600 01/19/16 0700  BP: (!) 123/55 (!) 108/48 (!) 102/41 (!) 95/39  Pulse:  (!) 128    Resp: 15 (!) 22 (!) 28 18  Temp:      TempSrc:      SpO2: 99% 94% 95% 94%  Weight:      Height:       Vent Mode: PRVC FiO2 (%):  [60 %-100 %] 90 % Set Rate:  [14 bmp] 14 bmp Vt Set:  [470 mL] 470 mL PEEP:  [5 cmH20] 5 cmH20 Plateau Pressure:  [24 cmH20] 24 cmH20  Gen: sedated on vent HENT: NCAT, scleral icterus noted, ETT in place PULM: Crackles left lung clear R, vent supported breaths CV: Tachy, regular, no mgr, notable edema legs bilaterally GI: BS+, soft, nontender Derm: ecchymosis extensor surfaces, acanthosis type rash lumbar area back Neuro: sedated on vent, will move spontaneously  CBC    Component Value Date/Time   WBC 16.3 (H) 01/19/2016 0340   RBC 2.31 (L) 01/19/2016 0340   HGB 8.1 (L) 01/19/2016 0340   HCT 23.7 (L) 01/19/2016 0340   PLT 71 (L) 01/19/2016 0340   MCV 102.6 (H) 01/19/2016 0340   MCH 35.1 (H) 01/19/2016 0340   MCHC 34.2 01/19/2016 0340   RDW 19.9 (H) 01/19/2016 0340   LYMPHSABS 1.7 01/18/2016 2136   MONOABS 1.1 (H) 01/18/2016 2136   EOSABS 0.1 01/18/2016 2136   BASOSABS 0.0 01/18/2016 2136    BMET    Component Value Date/Time   NA 133 (L) 01/19/2016 0340   NA 132 (L) 01/19/2016 0340   K 4.1 01/19/2016 0340   K 4.1 01/19/2016 0340   CL 102 01/19/2016 0340   CL 101 01/19/2016 0340   CO2 22 01/19/2016 0340   CO2 22 01/19/2016 0340   GLUCOSE 123 (H) 01/19/2016 0340   GLUCOSE 123 (H) 01/19/2016 0340   BUN 39 (H) 01/19/2016 0340   BUN 39 (H) 01/19/2016 0340   CREATININE 1.46 (H) 01/19/2016 0340   CREATININE 1.50 (H) 01/19/2016 0340   CALCIUM 8.7 (L) 01/19/2016 0340   CALCIUM 8.6 (L) 01/19/2016 0340   GFRNONAA 41 (L) 01/19/2016 0340   GFRNONAA 40 (L) 01/19/2016 0340   GFRAA 47 (L)  01/19/2016 0340   GFRAA 46 (L) 01/19/2016 0340    CXR images personally reviewed> notable bilateral airspace disease bilaterally left > right  Impression/Plan: Acute respiratory failure with hypoxemia> severe, increase PEEP, try to get FiO2 < 60%, repeat ABG, continue full vent support, change RASS goal to -2 given vent dyssynchrony  Severe CAP> continue cefepime, f/u cultures, send resp culture, consider stopping vanc tomorrow Anasarca> lasix x2 doses EtOH cirrhosis with likely hepatic encephalopathy> continue lactulose, sedate to RASS goal -2 for now, but will try to be cautious with narcotics and benzos as they will perpetuate her delirium Nutrition> start tube feedings  Husband updated on phone by me  My additional cc time 45 minutes  Heber CarolinaBrent Kailo Kosik, MD  PCCM Pager: [phone] Cell: [phone] After 3pm or if no response, call [phone]

## 2016-01-19 NOTE — Progress Notes (Signed)
eLink Physician-Brief Progress Note Patient Name: Kristina Morris DOB: [birth year] MRN: [id number]   Date of Service  01/19/2016  HPI/Events of Note  Multiple issues: 1. BP = 90/30 on Phenylephrine IV infusion. And 2. INR = 2.67.   eICU Interventions  Will order: 1. Transfuse FFP 4 units IV now. 2. Norepinephrine IV infusion. Titrate to MAP > 65.  3. Will inform ground team of need for CVL.      Intervention Category Intermediate Interventions: Coagulopathy - evaluation and management  Sommer,Steven Eugene 01/19/2016, 9:01 PM

## 2016-01-19 NOTE — Progress Notes (Signed)
CCM NP made aware that patient's blood pressure had continue to remain low. Last Blood pressure taken was 81/26  (44), also informed that the patient's urine output has only been 18cc since the beginning of the shift. Bp has continued to run low despite continual decreases in sedation. NP added order for Albumin and Neo-senephrine drip as needed with orders to continue to decrease sedation if possible. Will continue to follow up.

## 2016-01-19 NOTE — Progress Notes (Signed)
eLink Physician-Brief Progress Note Patient Name: Kristina Morris DOB: 02-26-1966 MRN: [id number]   Date of Service  01/19/2016  HPI/Events of Note  Hypotension - Bedside nurse reports increasing Phenylephrine IV infusion requirement and oliguria. Hx of hepatic disease.   eICU Interventions  Will order: 1. 5% Albumin 12.5 gm IV now. 2. PT/INR and PTT now (in the event a CVL becomes necessary which looks very likely).     Intervention Category Major Interventions: Hypotension - evaluation and management Intermediate Interventions: Oliguria - evaluation and management  Alvey Brockel Eugene 01/19/2016, 4:44 PM

## 2016-01-19 NOTE — Procedures (Addendum)
Intubation Procedure Note Roque CashLaura J Nowland [id number] 07/24/1965  Procedure: Intubation Indications: Respiratory insufficiency  Procedure Details Consent: Unable to obtain consent because of emergent medical necessity. Time Out: Verified patient identification, verified procedure, site/side was marked, verified correct patient position, special equipment/implants available, medications/allergies/relevent history reviewed, required imaging and test results available.  Performed  Miller and 3  Stomach contents noted above the vocal cords. They had to be suctioned before intubation.  Evaluation Hemodynamic Status: BP stable throughout; O2 sats: stable throughout Patient's Current Condition: stable Complications: No apparent complications Patient did tolerate procedure well. Chest X-ray ordered to verify placement.  CXR: pending.   Ostin Mathey 01/19/2016

## 2016-01-19 NOTE — Progress Notes (Signed)
Date:  January 19, 2016 Chart reviewed for concurrent status and case management needs.   Will continue to follow the patient for status change: Intubated last pm due to resp failure Discharge Planning: following for needs Expected discharge date: [id number]/02/2016 Marcelle SmilingRhonda Taavi Hoose, BSN, [place]RN3, [place]CCM   [phone]

## 2016-01-19 NOTE — Progress Notes (Signed)
Initial Nutrition Assessment  DOCUMENTATION CODES:   Not applicable  INTERVENTION:  - Will order Vital 1.2 @ 20 mL/hr; increase by 10 mL every 4 hours to reach goal rate of Vital 1.2 @ 50 mL/hr. At goal rate, this regimen will provide 1440 kcal, 90 grams of protein, and 973 mL free water.  - Free water flush per MD/NP given edema, cirrhosis, Lasix order.  - Monitor magnesium, potassium, and phosphorus daily for at least 3 days, MD to replete as needed, as pt is at risk for refeeding syndrome given hx of alcohol abuse with cirrhosis, 5% intake of meals prior to intubation.  - Will follow-up 9/7.  NUTRITION DIAGNOSIS:   Inadequate oral intake related to inability to eat as evidenced by NPO status.  GOAL:   Patient will meet greater than or equal to 90% of their needs  MONITOR:   Vent status, TF tolerance, Weight trends, Labs, I & O's  REASON FOR ASSESSMENT:   Ventilator, Consult Enteral/tube feeding initiation and management  ASSESSMENT:   50 y.o. female with a past medical history significant for alcoholic cirrhosis with recent liver failure who presents with chest pain and worsening pneumonia. The patient was admitted to the hospitalist service here in mid June for acute liver failure in the setting of alcoholic cirrhosis. She was seen by GI, started on steroids, Lasix, and spironolactone, received paracentesis (no SBP) and was discharged with GI follow up with her hepatologist at [facility]. Since then, she has stabilized, had well controlled leg swelling, no more ascites and completed her course of steroids. She is abstinent from alcohol now. About 4 days ago, she started to develop some chest pain, fever and shortness of breath.  She was seen in our ER, had a new left lower lobe infiltrate on CXR but was without hypoxia or leukocytosis and was prescribed azithromycin and discharged.  Since then, she has taken the azithromycin but feels progressively worse pain and dyspnea so today  she called EMS.  EMS found her hypoxic in the high 80s, but placed CPAP because they couldn't improve her O2 sat with supplemental O2  Pt seen for new vent and TF consult. BMI indicates overweight status. No family/visitors present at this time. Per notes, pt with possible aspiration following oral Lactulose administration yesterday; she was intubated this AM at ~0325. Pt was previously on Regular diet with 5% of breakfast and lunch consumed yesterday and no other documented intakes since admission. Pt with hx of alcohol abuse, alcoholic cirrhosis with ascites. Unsure if pt is at risk for refeeding but will order TF as outlined above to limit possibility of refeeding.   Physical assessment shows no muscle or fat wasting, moderate edema to BLE note. Bilateral mittens Per chart review, pt has gained 3.4 kg since admission. Used admission weight of 68.9 kg to estimate needs. Will follow-up tomorrow to adjust estimated needs and TF regimen if needed.  Patient is currently intubated on ventilator support with OGT in place.  MV: 7.6 L/min Temp (24hrs), Avg:98.1 F (36.7 C), Min:97.1 F (36.2 C), Max:98.7 F (37.1 C) Propofol: none At time of visit: BP of 100/34 and MAP of 57.  Medications reviewed; 1 mg IV folic acid/day, 60 mg IV Lasix QID, PRN IV Zofra, 40 mg Protonix/day, 100 mg IV thiamine/day.  Labs reviewed; Na: 133 mmol/L, BUN: 39 mg/dL, creatinine: 1.46 mg/dL, Ca: 8.7 mg/dL, GFR: 41 mL/min, Alk Phos and AST elevated, ammonia: 67 umol/L, Phos: 5 mg/dL, K and Mg WDL.  Drip: Fentanyl @ 200 mcg/hr.    Diet Order:  Diet NPO time specified  Skin:  Reviewed, no issues  Last BM:  9/6  Height:   Ht Readings from Last 1 Encounters:  01/19/16 '5\' 6"'$  (1.676 m)    Weight:   Wt Readings from Last 1 Encounters:  01/17/16 159 lb 6.3 oz (72.3 kg)    Ideal Body Weight:  59.09 kg  BMI:  Body mass index is 25.73 kg/m.  Estimated Nutritional Needs:   Kcal:  1495  Protein:  83-103 grams  (1.2-1.5 grams/kg)  Fluid:  1.2-1.5 L/day  EDUCATION NEEDS:   No education needs identified at this time    Jarome Matin, MS, RD, LDN Inpatient Clinical Dietitian Pager # [phone] After hours/weekend pager # [phone]

## 2016-01-20 ENCOUNTER — Inpatient Hospital Stay (HOSPITAL_COMMUNITY): Payer: 59

## 2016-01-20 LAB — GLUCOSE, CAPILLARY
GLUCOSE-CAPILLARY: 193 mg/dL — AB (ref 65–99)
GLUCOSE-CAPILLARY: 249 mg/dL — AB (ref 65–99)
Glucose-Capillary: 169 mg/dL — ABNORMAL HIGH (ref 65–99)
Glucose-Capillary: 246 mg/dL — ABNORMAL HIGH (ref 65–99)
Glucose-Capillary: 264 mg/dL — ABNORMAL HIGH (ref 65–99)
Glucose-Capillary: 248 mg/dL — ABNORMAL HIGH (ref 65–99)

## 2016-01-20 LAB — ECHOCARDIOGRAM COMPLETE
Height: 66 in
Weight: 2659.63 oz

## 2016-01-20 LAB — BASIC METABOLIC PANEL
Anion gap: 9 (ref 5–15)
Anion gap: 10 (ref 5–15)
BUN: 47 mg/dL — AB (ref 6–20)
BUN: 51 mg/dL — ABNORMAL HIGH (ref 6–20)
CALCIUM: 8.2 mg/dL — AB (ref 8.9–10.3)
CHLORIDE: 101 mmol/L (ref 101–111)
CO2: 21 mmol/L — AB (ref 22–32)
CO2: 19 mmol/L — AB (ref 22–32)
CREATININE: 2.35 mg/dL — AB (ref 0.44–1.00)
CREATININE: 2.85 mg/dL — AB (ref 0.44–1.00)
Calcium: 8.4 mg/dL — ABNORMAL LOW (ref 8.9–10.3)
Chloride: 100 mmol/L — ABNORMAL LOW (ref 101–111)
GFR calc Af Amer: 27 mL/min — ABNORMAL LOW (ref >60)
GFR calc non Af Amer: 23 mL/min — ABNORMAL LOW (ref >60)
GFR calc non Af Amer: 18 mL/min — ABNORMAL LOW (ref >60)
GFR, EST AFRICAN AMERICAN: 21 mL/min — AB (ref >60)
GLUCOSE: 156 mg/dL — AB (ref 65–99)
GLUCOSE: 280 mg/dL — AB (ref 65–99)
POTASSIUM: 3.8 mmol/L (ref 3.5–5.1)
Potassium: 4.5 mmol/L (ref 3.5–5.1)
Sodium: 131 mmol/L — ABNORMAL LOW (ref 135–145)
Sodium: 129 mmol/L — ABNORMAL LOW (ref 135–145)

## 2016-01-20 LAB — PHOSPHORUS
PHOSPHORUS: 4.5 mg/dL (ref 2.5–4.6)
Phosphorus: 4.4 mg/dL (ref 2.5–4.6)

## 2016-01-20 LAB — BLOOD GAS, ARTERIAL
ACID-BASE DEFICIT: 2.7 mmol/L — AB (ref 0.0–2.0)
Acid-base deficit: 7.6 mmol/L — ABNORMAL HIGH (ref 0.0–2.0)
BICARBONATE: 22.4 mmol/L (ref 20.0–28.0)
Bicarbonate: 18.7 mmol/L — ABNORMAL LOW (ref 20.0–28.0)
DRAWN BY: 331471
Drawn by: 27605
FIO2: 0.7
FIO2: 1
LHR: 14 {breaths}/min
O2 SAT: 99.4 %
O2 Saturation: 82.1 %
PEEP: 10 cmH2O
PEEP: 14 cmH2O
PH ART: 7.244 — AB (ref 7.350–7.450)
PO2 ART: 50.8 mmHg — AB (ref 83.0–108.0)
Patient temperature: 97.1
Patient temperature: 98.6
RATE: 28 resp/min
VT: 470 mL
VT: 360 mL
pCO2 arterial: 42 mmHg (ref 32.0–48.0)
pCO2 arterial: 44.6 mmHg (ref 32.0–48.0)
pH, Arterial: 7.342 — ABNORMAL LOW (ref 7.350–7.450)
pO2, Arterial: 163 mmHg — ABNORMAL HIGH (ref 83.0–108.0)

## 2016-01-20 LAB — CBC
HEMATOCRIT: 21.3 % — AB (ref 36.0–46.0)
Hemoglobin: 7.2 g/dL — ABNORMAL LOW (ref 12.0–15.0)
MCH: 35.6 pg — AB (ref 26.0–34.0)
MCHC: 33.8 g/dL (ref 30.0–36.0)
MCV: 105.4 fL — AB (ref 78.0–100.0)
Platelets: 69 10*3/uL — ABNORMAL LOW (ref 150–400)
RBC: 2.02 MIL/uL — ABNORMAL LOW (ref 3.87–5.11)
RDW: 20 % — AB (ref 11.5–15.5)
WBC: 17 10*3/uL — AB (ref 4.0–10.5)

## 2016-01-20 LAB — MAGNESIUM
MAGNESIUM: 2.1 mg/dL (ref 1.7–2.4)
Magnesium: 2.2 mg/dL (ref 1.7–2.4)

## 2016-01-20 LAB — CALCIUM, IONIZED: Calcium, Ionized, Serum: 5.2 mg/dL (ref 4.5–5.6)

## 2016-01-20 MED ORDER — SODIUM CHLORIDE 0.9 % IV SOLN
1.0000 g | Freq: Two times a day (BID) | INTRAVENOUS | Status: DC
  Administered 2016-01-20: 1 g via INTRAVENOUS
  Filled 2016-01-20 (×2): qty 1

## 2016-01-20 MED ORDER — FENTANYL BOLUS VIA INFUSION
50.0000 ug | INTRAVENOUS | Status: DC | PRN
  Filled 2016-01-20: qty 50

## 2016-01-20 MED ORDER — POTASSIUM CHLORIDE 20 MEQ/15ML (10%) PO SOLN
40.0000 meq | Freq: Once | ORAL | Status: AC
  Administered 2016-01-20: 40 meq
  Filled 2016-01-20: qty 30

## 2016-01-20 MED ORDER — SODIUM CHLORIDE 0.9 % IV SOLN
2.0000 mg/h | INTRAVENOUS | Status: DC
  Administered 2016-01-20: 2 mg/h via INTRAVENOUS
  Administered 2016-01-20 – 2016-01-21 (×2): 1 mg/h via INTRAVENOUS
  Filled 2016-01-20 (×3): qty 10

## 2016-01-20 MED ORDER — ARTIFICIAL TEARS OP OINT
1.0000 | TOPICAL_OINTMENT | Freq: Three times a day (TID) | OPHTHALMIC | Status: DC
Start: 2016-01-20 — End: 2016-01-22
  Administered 2016-01-20 – 2016-01-22 (×6): 1 via OPHTHALMIC
  Filled 2016-01-20: qty 3.5

## 2016-01-20 MED ORDER — HEPARIN SODIUM (PORCINE) 5000 UNIT/ML IJ SOLN
5000.0000 [IU] | Freq: Three times a day (TID) | INTRAMUSCULAR | Status: DC
  Administered 2016-01-20 – 2016-01-25 (×15): 5000 [IU] via SUBCUTANEOUS
  Filled 2016-01-20 (×15): qty 1

## 2016-01-20 MED ORDER — FENTANYL CITRATE (PF) 100 MCG/2ML IJ SOLN
100.0000 ug | Freq: Once | INTRAMUSCULAR | Status: AC
  Administered 2016-01-20: 100 ug via INTRAVENOUS

## 2016-01-20 MED ORDER — SODIUM CHLORIDE 0.9% FLUSH: 10.0000 mL | INTRAVENOUS | Status: DC | PRN

## 2016-01-20 MED ORDER — VANCOMYCIN HCL IN DEXTROSE 1-5 GM/200ML-% IV SOLN
1000.0000 mg | Freq: Once | INTRAVENOUS | Status: AC
  Administered 2016-01-20: 1000 mg via INTRAVENOUS
  Filled 2016-01-20: qty 200

## 2016-01-20 MED ORDER — MIDAZOLAM BOLUS VIA INFUSION
2.0000 mg | INTRAVENOUS | Status: DC | PRN
  Administered 2016-01-21: 2 mg via INTRAVENOUS
  Filled 2016-01-20: qty 2

## 2016-01-20 MED ORDER — SODIUM CHLORIDE 0.9 % IV SOLN
10.0000 mg | Freq: Two times a day (BID) | INTRAVENOUS | Status: DC
  Administered 2016-01-20 – 2016-01-24 (×9): 10 mg via INTRAVENOUS
  Filled 2016-01-20 (×11): qty 1

## 2016-01-20 MED ORDER — MIDAZOLAM HCL 2 MG/2ML IJ SOLN
2.0000 mg | Freq: Once | INTRAMUSCULAR | Status: AC
  Administered 2016-01-20: 2 mg via INTRAVENOUS

## 2016-01-20 MED ORDER — SODIUM CHLORIDE 0.9 % IV SOLN
3.0000 ug/kg/min | INTRAVENOUS | Status: DC
  Administered 2016-01-20: 3 ug/kg/min via INTRAVENOUS
  Administered 2016-01-21 (×2): 2 ug/kg/min via INTRAVENOUS
  Filled 2016-01-20 (×3): qty 20

## 2016-01-20 MED ORDER — INSULIN ASPART 100 UNIT/ML ~~LOC~~ SOLN
0.0000 [IU] | SUBCUTANEOUS | Status: DC
  Administered 2016-01-20 (×2): 3 [IU] via SUBCUTANEOUS
  Administered 2016-01-20: 5 [IU] via SUBCUTANEOUS
  Administered 2016-01-20: 3 [IU] via SUBCUTANEOUS
  Administered 2016-01-21: 2 [IU] via SUBCUTANEOUS
  Administered 2016-01-21 (×2): 3 [IU] via SUBCUTANEOUS
  Administered 2016-01-21: 2 [IU] via SUBCUTANEOUS
  Administered 2016-01-21 – 2016-01-22 (×3): 3 [IU] via SUBCUTANEOUS
  Administered 2016-01-22 – 2016-01-24 (×12): 2 [IU] via SUBCUTANEOUS
  Administered 2016-01-24: 3 [IU] via SUBCUTANEOUS
  Administered 2016-01-24: 1 [IU] via SUBCUTANEOUS
  Administered 2016-01-24 (×3): 2 [IU] via SUBCUTANEOUS
  Administered 2016-01-25: 1 [IU] via SUBCUTANEOUS
  Administered 2016-01-25 (×5): 2 [IU] via SUBCUTANEOUS
  Administered 2016-01-26: 1 [IU] via SUBCUTANEOUS
  Administered 2016-01-26 (×2): 2 [IU] via SUBCUTANEOUS
  Administered 2016-01-26: 1 [IU] via SUBCUTANEOUS
  Administered 2016-01-26: 2 [IU] via SUBCUTANEOUS
  Administered 2016-01-26 – 2016-01-27 (×2): 1 [IU] via SUBCUTANEOUS
  Administered 2016-01-27 (×2): 2 [IU] via SUBCUTANEOUS
  Administered 2016-01-27 (×2): 1 [IU] via SUBCUTANEOUS
  Administered 2016-01-28 (×5): 2 [IU] via SUBCUTANEOUS
  Administered 2016-01-28 – 2016-01-30 (×5): 1 [IU] via SUBCUTANEOUS

## 2016-01-20 MED ORDER — MIDAZOLAM HCL 2 MG/2ML IJ SOLN: 2.0000 mg | Freq: Once | INTRAMUSCULAR | Status: DC | PRN

## 2016-01-20 MED ORDER — SODIUM CHLORIDE 0.9% FLUSH
10.0000 mL | Freq: Two times a day (BID) | INTRAVENOUS | Status: DC
  Administered 2016-01-22 – 2016-01-24 (×4): 10 mL
  Administered 2016-01-25: 20 mL
  Administered 2016-01-26: 10 mL
  Administered 2016-01-26: 30 mL
  Administered 2016-01-27 – 2016-01-30 (×6): 10 mL

## 2016-01-20 MED ORDER — FAMOTIDINE IN NACL 20-0.9 MG/50ML-% IV SOLN: 20.0000 mg | Freq: Two times a day (BID) | INTRAVENOUS | Status: DC

## 2016-01-20 MED ORDER — SODIUM CHLORIDE 0.9 % IV SOLN
100.0000 ug/h | INTRAVENOUS | Status: DC
  Administered 2016-01-20: 100 ug/h via INTRAVENOUS
  Administered 2016-01-20: 125 ug/h via INTRAVENOUS
  Administered 2016-01-22: 100 ug/h via INTRAVENOUS
  Filled 2016-01-20 (×2): qty 50

## 2016-01-20 MED ORDER — LACTULOSE 10 GM/15ML PO SOLN
20.0000 g | Freq: Three times a day (TID) | ORAL | Status: DC
  Administered 2016-01-20 – 2016-01-26 (×19): 20 g
  Filled 2016-01-20 (×20): qty 30

## 2016-01-20 MED ORDER — ALBUMIN HUMAN 25 % IV SOLN
75.0000 g | Freq: Once | INTRAVENOUS | Status: AC
  Administered 2016-01-20: 75 g via INTRAVENOUS
  Filled 2016-01-20: qty 300

## 2016-01-20 MED ORDER — VASOPRESSIN 20 UNIT/ML IV SOLN
0.0300 [IU]/min | INTRAVENOUS | Status: DC
  Administered 2016-01-20 – 2016-01-21 (×2): 0.03 [IU]/min via INTRAVENOUS
  Filled 2016-01-20 (×2): qty 2

## 2016-01-20 MED ORDER — SODIUM CHLORIDE 0.9 % IV BOLUS (SEPSIS)
500.0000 mL | Freq: Once | INTRAVENOUS | Status: AC
  Administered 2016-01-20: 500 mL via INTRAVENOUS

## 2016-01-20 MED ORDER — PHENYLEPHRINE HCL 10 MG/ML IJ SOLN
30.0000 ug/min | INTRAVENOUS | Status: DC
  Administered 2016-01-20: 50 ug/min via INTRAVENOUS
  Filled 2016-01-20: qty 1

## 2016-01-20 MED ORDER — CISATRACURIUM BOLUS VIA INFUSION
0.0500 mg/kg | Freq: Once | INTRAVENOUS | Status: AC
  Administered 2016-01-20: 3.8 mg via INTRAVENOUS
  Filled 2016-01-20: qty 4

## 2016-01-20 MED ORDER — MEROPENEM 500 MG IV SOLR
500.0000 mg | Freq: Two times a day (BID) | INTRAVENOUS | Status: DC
  Administered 2016-01-20 – 2016-01-21 (×2): 500 mg via INTRAVENOUS
  Filled 2016-01-20 (×4): qty 0.5

## 2016-01-20 MED ORDER — HYDROCORTISONE NA SUCCINATE PF 100 MG IJ SOLR
50.0000 mg | Freq: Four times a day (QID) | INTRAMUSCULAR | Status: DC
Start: 2016-01-20 — End: 2016-01-22
  Administered 2016-01-20 – 2016-01-22 (×9): 50 mg via INTRAVENOUS
  Filled 2016-01-20 (×9): qty 2

## 2016-01-20 MED ORDER — VITAL AF 1.2 CAL PO LIQD
1000.0000 mL | ORAL | Status: DC
  Administered 2016-01-20 – 2016-01-21 (×3): 1000 mL
  Filled 2016-01-20 (×2): qty 1000

## 2016-01-20 MED ORDER — PHENYLEPHRINE HCL 10 MG/ML IJ SOLN
30.0000 ug/min | INTRAVENOUS | Status: DC
  Administered 2016-01-20: 115 ug/min via INTRAVENOUS
  Administered 2016-01-20 (×2): 105 ug/min via INTRAVENOUS
  Administered 2016-01-21: 30 ug/min via INTRAVENOUS
  Administered 2016-01-21: 103 ug/min via INTRAVENOUS
  Administered 2016-01-22: 20 ug/min via INTRAVENOUS
  Administered 2016-01-22: 60 ug/min via INTRAVENOUS
  Administered 2016-01-23: 50 ug/min via INTRAVENOUS
  Filled 2016-01-20 (×7): qty 4

## 2016-01-20 MED ORDER — MAGNESIUM SULFATE 2 GM/50ML IV SOLN
2.0000 g | Freq: Once | INTRAVENOUS | Status: AC
Start: 2016-01-20 — End: 2016-01-20
  Administered 2016-01-20: 2 g via INTRAVENOUS
  Filled 2016-01-20: qty 50

## 2016-01-20 MED ORDER — FENTANYL CITRATE (PF) 100 MCG/2ML IJ SOLN: 100.0000 ug | Freq: Once | INTRAMUSCULAR | Status: DC | PRN

## 2016-01-20 NOTE — Progress Notes (Signed)
Phenylephrine drip changed to quadruple strength per RN request due to high rate of infusion. Spoke to Jenne PaneMargaret Streble, RN, to change Alaris pump settings due to change in concentration of infusion.    Greer PickerelJigna Mckynleigh Mussell, PharmD, BCPS Pager: [phone] 01/20/2016 9:20 AM

## 2016-01-20 NOTE — Procedures (Signed)
Central Venous Catheter Insertion Procedure Note Roque CashLaura J Cohron [id number] 1965-09-04  Procedure: Insertion of Central Venous Catheter Indications: Assessment of intravascular volume, Drug and/or fluid administration and Frequent blood sampling  Procedure Details Consent: Unable to obtain consent because of altered level of consciousness. Time Out: Verified patient identification, verified procedure, site/side was marked, verified correct patient position, special equipment/implants available, medications/allergies/relevent history reviewed, required imaging and test results available.  Performed  Maximum sterile technique was used including antiseptics, cap, gloves, gown, hand hygiene, mask and sheet. Skin prep: Chlorhexidine; local anesthetic administered A antimicrobial bonded/coated triple lumen catheter was placed in the left internal jugular vein using the Seldinger technique.  Evaluation Blood flow good Complications: No apparent complications Patient did tolerate procedure well. Chest X-ray ordered to verify placement.  CXR: pending.  Procedure performed under direct ultrasound guidance for real time vessel cannulation.      Rutherford Guysahul Amela Handley, [place] - [facility] Pager: [phone]  or [phone] 01/20/2016, 5:59 AM

## 2016-01-20 NOTE — Procedures (Signed)
Arterial Catheter Insertion Procedure Note Kristina CashLaura J Morris [id number] [birth year]  Procedure: Insertion of Arterial Catheter  Indications: Blood pressure monitoring and Frequent blood sampling  Procedure Details Consent: Risks of procedure as well as the alternatives and risks of each were explained to the (patient/caregiver).  Consent for procedure obtained. Time Out: Verified patient identification, verified procedure, site/side was marked, verified correct patient position, special equipment/implants available, medications/allergies/relevent history reviewed, required imaging and test results available.  Performed  Maximum sterile technique was used including cap, gloves, gown, hand hygiene, mask and sheet. Skin prep: Chlorhexidine; local anesthetic administered 22 gauge catheter was inserted into right radial artery using the Seldinger technique.  Evaluation Blood flow good; BP tracing good. Complications: No apparent complications.   [name], [name] D 01/20/2016

## 2016-01-20 NOTE — Progress Notes (Signed)
Pharmacy Antibiotic Note  Kristina Morris is a 50 y.o. female admitted on 02/09/2016 with pneumonia. Vancomycin and  cefepime were started on admission and flagyl added later for suspected aspiration PNA. Vancomycin discontinued 9/3 as MRSA PCR negative.  Patient condition has deteriorated and now on paralytic.  To change abx to merrem and resume vancomycin.  - Tmax 100.1, wbc elevated, scr trending up 2.35 (crcl~30), no UOP doc  Plan: - vancomycin 1000 mg x1.  With renal failure, will check level on 9/8 and redose if level is <20 - merrem 1gm IV q12h - monitor renal function closely  ___________________________________  Height: 5\' 6"  (167.6 cm) Weight: 166 lb 3.6 oz (75.4 kg) IBW/kg (Calculated) : 59.3  Temp (24hrs), Avg:98.9 F (37.2 C), Min:97.1 F (36.2 C), Max:100.1 F (37.8 C)   Recent Labs Lab 01/16/16 0045 01/16/16 0145 01/16/16 0405 01/18/16 0352 01/18/16 2136 01/19/16 0340 01/19/16 1234 01/20/16 0330  WBC  --   --  11.8* 16.5* 16.2* 16.3*  --  17.0*  CREATININE  --   --  0.53 1.39* 1.45* 1.50*  1.46*  --  2.35*  LATICACIDVEN 1.58 1.7 1.5  --   --  1.3 1.3  --     Estimated Creatinine Clearance: 29.7 mL/min (by C-G formula based on SCr of 2.35 mg/dL).    Allergies  Allergen Reactions  . Avelox [Moxifloxacin Hcl In Nacl] Shortness Of Breath, Itching and Rash    GI Upset  . Bee Venom Anaphylaxis  . Doxycycline Nausea Only and Rash  . Amoxicillin Rash    Upset stomach   . Erythromycin Rash    Upset stomach  . Sulfa Antibiotics Rash    Upset stomach   Antimicrobials this admission: 9/2 Vancomycin >> 9/3>> resumed 9/7>> 9/2 Cefepime >> 9/7 9/5 Flagyl >> 9/7 9/7 merrem>>  Levels/dose changes this admission: N/a  Microbiology results: 9/2 BCx x2:  9/3 UCx: NGF 9/3 MRSA PCR: neg 9/6 BCx x2: 9/6 Trach asp:     Thank you for allowing pharmacy to be a part of this patient's care.  Dorna LeitzAnh Guage Efferson, PharmD, BCPS 01/20/2016 9:10 AM

## 2016-01-20 NOTE — Progress Notes (Signed)
Nutrition Follow-up  DOCUMENTATION CODES:   Not applicable  INTERVENTION:  - Increase Vital 1.2 to new goal rate of 55 mL/hr which will provide 1584 kcal (96% estimated need), 99 grams of protein, and 1071 mL free water.  - Will start PEPuP protocol. - Free water flush per MD/NP, if desired.  - Monitor magnesium, potassium, and phosphorus daily for at least 2 more days, MD to replete as needed, as pt is at risk for refeeding syndrome. - RD will follow-up 9/8  NUTRITION DIAGNOSIS:   Inadequate oral intake related to inability to eat as evidenced by NPO status. -ongoing  GOAL:   Patient will meet greater than or equal to 90% of their needs -met with current TF regimen.  MONITOR:   Vent status, TF tolerance, Weight trends, Labs, I & O's  ASSESSMENT:   50 y.o. female with a past medical history significant for alcoholic cirrhosis with recent liver failure who presents with chest pain and worsening pneumonia. The patient was admitted to the hospitalist service here in mid June for acute liver failure in the setting of alcoholic cirrhosis. She was seen by GI, started on steroids, Lasix, and spironolactone, received paracentesis (no SBP) and was discharged with GI follow up with her hepatologist at [facility]. Since then, she has stabilized, had well controlled leg swelling, no more ascites and completed her course of steroids. She is abstinent from alcohol now. About 4 days ago, she started to develop some chest pain, fever and shortness of breath.  She was seen in our ER, had a new left lower lobe infiltrate on CXR but was without hypoxia or leukocytosis and was prescribed azithromycin and discharged.  Since then, she has taken the azithromycin but feels progressively worse pain and dyspnea so today she called EMS.  EMS found her hypoxic in the high 80s, but placed CPAP because they couldn't improve her O2 sat with supplemental O2  9/7 Pt continues with OGT and is currently receiving TF at  goal rate: Vital 1.2 @ 50 mL/hr which is providing 1440 kcal, 90 grams of protein, and 973 mL free water. Estimated kcal need updated this AM and continue to use admission weight of 68.9 kg as pt now +6.5 kg since admission. Will adjust TF as outlined above and follow-up tomorrow. Pt to be started on Nimbex today. RN reports pt appears to be tolerating TF, but no BM today. Flow sheet indicates pt had BM yesterday.   Patient is currently intubated on ventilator support MV: 8.9 L/min Temp (24hrs), Avg:98.9 F (37.2 C), Min:97.1 F (36.2 C), Max:100.1 F (37.8 C) Propofol: none Notes indicate goal MAP >65.   Medications reviewed; 75 g albumin x1 dose today, 1 mg IV folic acid/day, 50 mg IV Solu-Cortef QID, 2 g IV Mg sulfate x1 dose today, 40 mEq KCl x1 dose via OGT today, 100 mg IV thaimine/day.   Labs reviewed; CBGs: 169 and 193 mg/dL this AM, Na: 131 mmol/L, BUN: 47 mg/dL, creatinine: 2.35 mg/dL, Ca: 8.4 mg/dL, GFR: 23 mL/min. K, Mg and Phos WDL with Mg up and Phos down since yesterday.   Drips: Versed @ 2 mg/hr, Levo @ 4 mcg/min, Vaso @ 0.03 units/min, Neo @ 115 mcg/min, Fentanyl @ 125 mcg/hr.    9/6 - Per notes, pt with possible aspiration following oral Lactulose administration yesterday; she was intubated this AM at ~0325. - Pt was previously on Regular diet with 5% of breakfast and lunch consumed yesterday. - Pt with hx of alcohol abuse,  alcoholic cirrhosis with ascites.  - Unsure if pt is at risk for refeeding but will order TF as outlined above to limit possibility of refeeding.  - Physical assessment shows no muscle or fat wasting, moderate edema to BLE note.  - Pt has gained 3.4 kg since admission.  - Used admission weight of 68.9 kg to estimate needs.   Patient is currently intubated on ventilator support with OGT in place.  MV: 7.6 L/min Temp (24hrs), Avg:98.1 F (36.7 C), Max:98.7 F (37.1 C)  At time of visit: BP of 100/34 and MAP of 57. - Phos: 5 mg/dL, K and Mg WDL.   Drip: Fentanyl @ 200 mcg/hr.     Diet Order:   NPO  Skin:  Reviewed, no issues  Last BM:  9/6  Height:   Ht Readings from Last 1 Encounters:  01/19/16 '5\' 6"'$  (1.676 m)    Weight:   Wt Readings from Last 1 Encounters:  01/20/16 166 lb 3.6 oz (75.4 kg)    Ideal Body Weight:  59.09 kg  BMI:  Body mass index is 26.83 kg/m.  Estimated Nutritional Needs:   Kcal:  8756  Protein:  83-103 grams (1.2-1.5 grams/kg)  Fluid:  1.2-1.5 L/day  EDUCATION NEEDS:   No education needs identified at this time    Jarome Matin, MS, RD, LDN Inpatient Clinical Dietitian Pager # [phone] After hours/weekend pager # [phone]

## 2016-01-20 NOTE — Progress Notes (Signed)
PULMONARY / CRITICAL CARE MEDICINE   Name: Kristina Morris MRN: [id number] DOB: October 29, 1965    ADMISSION DATE:  02/06/2016 CONSULTATION DATE:  01/19/16  REFERRING MD:  Cena BentonVega - TRH  CHIEF COMPLAINT:  AMS, increased WOB  BRIEF: 50 y/o female with EtOH cirrhosis admitted on 9/2 with CAP, aspirated on 9/6 requiring intubation.  Developed ARDS subsequently.  SUBJECTIVE:  Sedated heavily on vent, in shock, on vasopressors, received albumin and ffp  VITAL SIGNS: BP (!) 95/49   Pulse (!) 119   Temp 98.8 F (37.1 C) (Oral)   Resp 18   Ht 5\' 6"  (1.676 m)   Wt 75.4 kg (166 lb 3.6 oz)   SpO2 97%   BMI 26.83 kg/m   HEMODYNAMICS:    VENTILATOR SETTINGS: Vent Mode: PRVC FiO2 (%):  [40 %-100 %] 100 % Set Rate:  [14 bmp-18 bmp] 18 bmp Vt Set:  [360 mL-470 mL] 360 mL PEEP:  [14 cmH20] 14 cmH20 Plateau Pressure:  [23 cmH20-30 cmH20] 29 cmH20  INTAKE / OUTPUT: I/O last 3 completed shifts: In: 5981 [I.V.:2952.8; Blood:273; NG/GT:705.2; IV Piggyback:2050] Out: 951 [Urine:301; Emesis/NG output:650]   PHYSICAL EXAMINATION: General: sedated on vent HENT: scleral icterus noted PULM: Crackles bilaterally, vent supported breaths CV: Tachy, regular, no mgr GI: BS+, distended abdomen but non-tender Derm: scattered ecchymoses, notable edema throughout, some jaundice Neuro: Sedated heavily on vent  LABS:  BMET  Recent Labs Lab 01/18/16 2136 01/19/16 0340 01/20/16 0330  NA 131* 132*  133* 131*  K 4.1 4.1  4.1 3.8  CL 101 101  102 101  CO2 23 22  22  21*  BUN 36* 39*  39* 47*  CREATININE 1.45* 1.50*  1.46* 2.35*  GLUCOSE 135* 123*  123* 156*    Electrolytes  Recent Labs Lab 01/18/16 2136  01/19/16 0340 01/19/16 0923 01/19/16 1555 01/20/16 0330  CALCIUM 8.7*  --  8.6*  8.7*  --   --  8.4*  MG  --   < > 1.7 1.7 1.7 2.2  PHOS  --   < > 4.7* 5.0* 5.5* 4.4  < > = values in this interval not displayed.  CBC  Recent Labs Lab 01/18/16 2136 01/19/16 0340 01/20/16 0330   WBC 16.2* 16.3* 17.0*  HGB 8.2* 8.1* 7.2*  HCT 24.4* 23.7* 21.3*  PLT 77* 71* 69*    Coag's  Recent Labs Lab 01/16/16 0405 01/19/16 1709  APTT  --  45*  INR 2.24 2.67    Sepsis Markers  Recent Labs Lab 01/16/16 0405 01/19/16 0340 01/19/16 0714 01/19/16 1234  LATICACIDVEN 1.5 1.3  --  1.3  PROCALCITON  --   --  0.54  --     ABG  Recent Labs Lab 01/19/16 0430 01/19/16 0930 01/19/16 1113  PHART 7.319* 7.342* 7.335*  PCO2ART 44.3 42.0 42.6  PO2ART 97.4 50.8* 139*    Liver Enzymes  Recent Labs Lab 01/16/2016 2115 01/16/16 0405 01/18/16 2136  AST 82* 72* 68*  ALT 35 32 30  ALKPHOS 159* 140* 137*  BILITOT 16.6* 15.5* 17.3*  ALBUMIN 2.6* 2.4* 2.3*    Cardiac Enzymes  Recent Labs Lab 01/19/16 0340 01/19/16 0923 01/19/16 1555  TROPONINI <0.03 <0.03 <0.03    Glucose  Recent Labs Lab 01/19/16 0816 01/19/16 1547 01/19/16 1953 01/19/16 2348 01/20/16 0312 01/20/16 0806  GLUCAP 119* 139* 181* 170* 169* 193*    Imaging Dg Chest Port 1 View  Result Date: 01/20/2016 CLINICAL DATA:  Endotracheal tube repositioning. Acute onset of  respiratory failure. Initial encounter. EXAM: PORTABLE CHEST 1 VIEW COMPARISON:  Chest radiograph performed 01/19/2016 FINDINGS: The patient's endotracheal tube is seen ending 3 cm above the carina. Enteric tubes are noted extending below the diaphragm. Worsening diffuse bilateral airspace opacification is noted, concerning for worsening bilateral pneumonia. A small left pleural effusion is suspected. No pneumothorax is seen. The lungs are mildly hypoexpanded. The cardiomediastinal silhouette is mildly enlarged. No acute osseous abnormalities are seen. IMPRESSION: 1. Endotracheal tube seen ending 3 cm above the carina. 2. Worsening diffuse bilateral airspace opacification, concerning for worsening bilateral pneumonia. Small left pleural effusion suspected. 3. Mild cardiomegaly. Electronically Signed   By: Roanna Raider M.D.   On:  01/20/2016 06:42   Dg Chest Port 1 View  Result Date: 01/20/2016 CLINICAL DATA:  Central line placement.  Initial encounter. EXAM: PORTABLE CHEST 1 VIEW COMPARISON:  Chest radiograph performed earlier today at 4:39 a.m. FINDINGS: The patient's endotracheal tube is seen ending 3 cm above the carina. The left IJ line is noted ending about the distal SVC. An enteric tube is noted extending below the diaphragm. Diffuse bilateral airspace opacification is noted, similar in appearance to the prior study and concerning for diffuse bilateral pneumonia. A small left pleural effusion is suspected. No pneumothorax is seen. The cardiomediastinal silhouette is borderline normal in size. No acute osseous abnormalities are identified. IMPRESSION: 1. Endotracheal tube seen ending 3 cm above the carina. 2. Left IJ line noted ending about the distal SVC. 3. Persistent diffuse bilateral airspace opacification is concerning for diffuse bilateral pneumonia. Suspect small left pleural effusion. Electronically Signed   By: Roanna Raider M.D.   On: 01/20/2016 06:40     STUDIES:  CXR 9/5 > multifocal PNA 9/7 Echo>   CULTURES: Blood 9/2 > Urine 9/2 > Sputum 9/6 > Blood 9/6 >   ANTIBIOTICS: Cefepime 9/2 > 9/7 Flagyl 9/5 > 9/7 Meropenem 9/7 >  Vanc 9/7 >   SIGNIFICANT EVENTS: 9/2 > admit 9/6 > PCCM called, pt intubated due to increased WOB  LINES/TUBES: ETT 9/6 > L IJ CVL 9/7 >   DISCUSSION: 50 y.o. female with ETOH cirrhosis, admitted 9/2 with HCAP. Had deterioration 9/5 after aspiration event and required intubation early AM hours 9/6 due to increased WOB and hypoxia. As of 01/20/2016 has ARDS and worsening multiorgan failure, critically ill and worsening overall condition.  ASSESSMENT / PLAN:  PULMONARY A: ARDS Aspiration PNA Pulm edema from cirrhosis, renal failure P:   Change to ARDS protocol VAP prevention Daily CXR ABG post change to ARDS protocol Start Nimbex Diurese when off vasopressor  pressors  CARDIOVASCULAR A:  SVT 01/20/2016 AM > likely levophed related Septic shock 01/20/2016, currently volume replete, complicated by cirrhosis, doubt cardiogenic Hx HLD P:  Monitor hemodynamics Stop levophed Add vasopressin Add hydrocortisone Titrate neosynephrine for MAP > 55 Hold preadmission furosemide, spironolactone Tele Echo Monitor CVP Albumin now  NEUROLOGIC A:   Acute encephalopathy: multifactorial in setting of hepatic encephalopathy and respiratory fialure Hx depression, ETOH use (reportedly now abstinent) P:   Change to nimbex due to ARDS RASS goal -5 on Nimbex No wake up assessment Continue folic acid, thiamine Hold preadmission cyclobenzaprine, sertraline, hydroxyzine  GASTROINTESTINAL A:   Hepatic encephalopathy Hx ETOH cirrhosis, fatty liver Protein calorie malnutrition GI prophylaxis Constipation Nutrition P:   Increase lactulose to tid Continue tube feedings Add pepcid for stress ulcer prophylaxis  RENAL A:   Hyponatremia - chronic, stable AKI > worsening 01/20/2016 Pseudohypocalcemia - corrects to 10.06 P:  Albumin 75g IV now Monitor BMET and UOP Replace electrolytes as needed  HEMATOLOGIC A:   Anemia - chronic Thrombocytopenia - chronic VTE Prophylaxis P:  Transfuse for Hgb < 7 Monitor platelet counts Start subcutaneous heparin for DVT prohylaxis SCD's CBC in AM  INFECTIOUS A:   Aspiration PNA Septic shock P:   Stop cefepime/flagyl Start meropenem and vancomycin F/u cultures  ENDOCRINE A:   No acute issues P:   Monitor gluocse   Family updated: Updated husband bedside 01/20/2016  Interdisciplinary Family Meeting v Palliative Care Meeting:  Due by: 9/13.  CC time: 45 minutes.   Heber Malaga, MD [facility] Pager: [phone] Cell: [phone] After 3pm or if no response, call [phone]  01/20/2016, 8:46 AM

## 2016-01-20 NOTE — Progress Notes (Signed)
Pharmacy Antibiotic Note  Kristina Morris is a [age] y.o. female admitted on 01/19/2016 with pneumonia. Vancomycin and  cefepime were started on admission and flagyl added later for suspected aspiration PNA. Vancomycin discontinued 9/3 as MRSA PCR negative.  Patient condition has deteriorated and now on paralytic.  To change abx to merrem and resume vancomycin.  - Tmax 100.1, wbc elevated, scr trending up 2.35 (crcl~30), no UOP doc - SCr increased further this afternoon, Cl < 30N  Plan: - vancomycin 1000 mg x1.  With renal failure, will check level on 9/8 and redose if level is <20 - merrem 1gm IV q12h reduced to 500mg  q12 for worsening renal function - monitor renal function closely  ___________________________________  Height: 5\' 6"  (167.6 cm) Weight: 166 lb 3.6 oz (75.4 kg) IBW/kg (Calculated) : 59.3  Temp (24hrs), Avg:98.9 F (37.2 C), Min:97.9 F (36.6 C), Max:99.5 F (37.5 C)   Recent Labs Lab 01/16/16 0045 01/16/16 0145 01/16/16 0405 01/18/16 0352 01/18/16 2136 01/19/16 0340 01/19/16 1234 01/20/16 0330 01/20/16 1642  WBC  --   --  11.8* 16.5* 16.2* 16.3*  --  17.0*  --   CREATININE  --   --  0.53 1.39* 1.45* 1.50*  1.46*  --  2.35* 2.85*  LATICACIDVEN 1.58 1.7 1.5  --   --  1.3 1.3  --   --     Estimated Creatinine Clearance: 24.5 mL/min (by C-G formula based on SCr of 2.85 mg/dL).    Allergies  Allergen Reactions  . Avelox [Moxifloxacin Hcl In Nacl] Shortness Of Breath, Itching and Rash    GI Upset  . Bee Venom Anaphylaxis  . Doxycycline Nausea Only and Rash  . Amoxicillin Rash    Upset stomach   . Erythromycin Rash    Upset stomach  . Sulfa Antibiotics Rash    Upset stomach   Antimicrobials this admission: 9/2 Vancomycin >> 9/3>> resumed 9/7>> 9/2 Cefepime >> 9/7 9/5 Flagyl >> 9/7 9/7 merrem>>  Levels/dose changes this admission: N/a  Microbiology results: 9/2 BCx x2: ngtd 9/3 UCx: NGF 9/3 MRSA PCR: neg 9/6 BCx x2: ngtd 9/6 Trach asp: no  organism on gram stain  Thank you for allowing pharmacy to be a part of this patient's care.  Otho BellowsGreen, Vilda Zollner L PharmD Pager [phone] 01/20/2016, 6:41 PM

## 2016-01-20 NOTE — Progress Notes (Signed)
Post central line insertion, patient HR 160.  Elink MD called.  Orders to restart neosynephrie and titrate down levophed received. Per MD, okay to use central line.    Additionally, patient placed on 100% Fi02 during insertion for known desaturations while supine.  After insertion, Fi02 decreased to original 50%.  Patient sp02 82%. Respiratory called to bedside. Patient placed on 100% Fi02.

## 2016-01-20 NOTE — Progress Notes (Signed)
Inpatient Diabetes Program Recommendations  AACE/ADA: New Consensus Statement on Inpatient Glycemic Control (2015)  Target Ranges:  Prepandial:   less than 140 mg/dL      Peak postprandial:   less than 180 mg/dL (1-2 hours)      Critically ill patients:  140 - 180 mg/dL   Results for Kristina Morris, Kristina Morris (MRN [id number]) as of 01/20/2016 12:41  Ref. Range 01/19/2016 23:48 01/20/2016 03:12 01/20/2016 08:06 01/20/2016 12:03  Glucose-Capillary Latest Ref Range: 65 - 99 mg/dL 914170 (H) 782169 (H) 956193 (H) 246 (H)    Admit with: Pneumonia/ Sepsis/ ARDS  History: Cirrhosis     -Note patient currently Intubated.  Paralyzed today.  Also getting continuous tube feedings at 55cc/hour.  -IV Solucortef 50 mg Q6 hours started today.  -Glucose levels on the rise since steroids started.      MD- Please consider starting ICU Glycemic Control Protocol     --Will follow patient during hospitalization--  Ambrose FinlandJeannine Johnston Breiana Stratmann RN, MSN, CDE Diabetes Coordinator Inpatient Glycemic Control Team Team Pager: [phone] (8a-5p)

## 2016-01-20 NOTE — Progress Notes (Signed)
Per MD patient to be paralyzed today on ARDS protocol. Bis monitor was applied and patient was sedated to show a reading between 40-60 for 30 minutes before paralytic was started. A basline train of four was found on right eyebrow to be 4 twitches at 40 milliamps. Paralytic will be increased to goal of 2 or 3 out of 4 twitches is showing or patient is ventilator compliant. Will continue to follow up and monitor patient.

## 2016-01-20 NOTE — Progress Notes (Signed)
  Echocardiogram 2D Echocardiogram has been performed.  Leta JunglingCooper, Lakeithia Rasor M 01/20/2016, 11:35 AM

## 2016-01-20 NOTE — Progress Notes (Signed)
Post cvl call to eMD from bedside RN   - patient tachy with HR 161 and MAP 58, SBP 92   Recent Labs Lab 01/16/16 0405 01/18/16 0352 01/18/16 2136 01/19/16 0340 01/19/16 0923 01/19/16 1555 01/20/16 0330  NA 134* 133* 131* 132*  133*  --   --  131*  K 3.5 4.2 4.1 4.1  4.1  --   --  3.8  CL 101 101 101 101  102  --   --  101  CO2 25 23 23 22  22   --   --  21*  GLUCOSE 119* 121* 135* 123*  123*  --   --  156*  BUN 12 28* 36* 39*  39*  --   --  47*  CREATININE 0.53 1.39* 1.45* 1.50*  1.46*  --   --  2.35*  CALCIUM 8.0* 8.9 8.7* 8.6*  8.7*  --   --  8.4*  MG  --   --   --  1.7 1.7 1.7 2.2  PHOS  --   --   --  4.7* 5.0* 5.5* 4.4     Plan - change levophe dot neo - - replete KCL for goal > 4 -  if th is does not work, try retracting cvl v amio  Dr. Kalman ShanMurali Shemica Meath, M.D., [facility]P Pulmonary and Critical Care Medicine Staff Physician [facility] [place] [facility] Pager: [phone], If no answer or between  15:00h - 7:00h: call [phone]  01/20/2016 6:25 AM

## 2016-01-20 NOTE — Progress Notes (Signed)
   Case review  - cirrhosis patieent - sbp 113 with diast 38 and high pulse pressure with map 62. Goal is MAP > 65. This can be hard to acchieve in this situation. Adequate bP goal is SBP > 95 and MAP > 55 - order adjusted  - 4 unit FFP ordered for below INR. Will change to just 1 unit FFP  Recent Labs Lab 01/16/16 0405 01/19/16 1709  INR 2.24 2.67    - reassess in 1-2h for need for cvl  Dr. Kalman ShanMurali Sriram Febles, M.D., [facility] Pulmonary and Critical Care Medicine Staff Physician [facility] [place] [facility] Pager: [phone], If no answer or between  15:00h - 7:00h: call [phone]  01/20/2016 12:24 AM

## 2016-01-21 ENCOUNTER — Inpatient Hospital Stay (HOSPITAL_COMMUNITY): Payer: 59

## 2016-01-21 DIAGNOSIS — N179 Acute kidney failure, unspecified: Secondary | ICD-10-CM

## 2016-01-21 DIAGNOSIS — J189 Pneumonia, unspecified organism: Secondary | ICD-10-CM

## 2016-01-21 LAB — CULTURE, RESPIRATORY W GRAM STAIN
Culture: NORMAL
Special Requests: NORMAL

## 2016-01-21 LAB — VANCOMYCIN, RANDOM: VANCOMYCIN RM: 14

## 2016-01-21 LAB — CBC WITH DIFFERENTIAL/PLATELET
BASOS PCT: 0 %
Basophils Absolute: 0 10*3/uL (ref 0.0–0.1)
EOS ABS: 0 10*3/uL (ref 0.0–0.7)
Eosinophils Relative: 0 %
HCT: 20.3 % — ABNORMAL LOW (ref 36.0–46.0)
Hemoglobin: 6.7 g/dL — CL (ref 12.0–15.0)
Lymphocytes Relative: 7 %
Lymphs Abs: 1.3 10*3/uL (ref 0.7–4.0)
MCH: 35.4 pg — AB (ref 26.0–34.0)
MCHC: 33 g/dL (ref 30.0–36.0)
MCV: 107.4 fL — AB (ref 78.0–100.0)
MONO ABS: 1.1 10*3/uL — AB (ref 0.1–1.0)
Monocytes Relative: 6 %
NEUTROS ABS: 16.7 10*3/uL — AB (ref 1.7–7.7)
Neutrophils Relative %: 87 %
PLATELETS: 55 10*3/uL — AB (ref 150–400)
RBC: 1.89 MIL/uL — ABNORMAL LOW (ref 3.87–5.11)
RDW: 20.3 % — AB (ref 11.5–15.5)
WBC: 19.1 10*3/uL — ABNORMAL HIGH (ref 4.0–10.5)

## 2016-01-21 LAB — GLUCOSE, CAPILLARY
GLUCOSE-CAPILLARY: 241 mg/dL — AB (ref 65–99)
GLUCOSE-CAPILLARY: 222 mg/dL — AB (ref 65–99)
GLUCOSE-CAPILLARY: 184 mg/dL — AB (ref 65–99)
GLUCOSE-CAPILLARY: 156 mg/dL — AB (ref 65–99)
Glucose-Capillary: 225 mg/dL — ABNORMAL HIGH (ref 65–99)
Glucose-Capillary: 236 mg/dL — ABNORMAL HIGH (ref 65–99)
Glucose-Capillary: 213 mg/dL — ABNORMAL HIGH (ref 65–99)

## 2016-01-21 LAB — BASIC METABOLIC PANEL
ANION GAP: 8 (ref 5–15)
BUN: 63 mg/dL — AB (ref 6–20)
CALCIUM: 8.7 mg/dL — AB (ref 8.9–10.3)
CO2: 20 mmol/L — AB (ref 22–32)
CREATININE: 3.22 mg/dL — AB (ref 0.44–1.00)
Chloride: 100 mmol/L — ABNORMAL LOW (ref 101–111)
GFR calc non Af Amer: 16 mL/min — ABNORMAL LOW (ref >60)
GFR, EST AFRICAN AMERICAN: 18 mL/min — AB (ref >60)
GLUCOSE: 226 mg/dL — AB (ref 65–99)
Potassium: 4.6 mmol/L (ref 3.5–5.1)
Sodium: 128 mmol/L — ABNORMAL LOW (ref 135–145)

## 2016-01-21 LAB — CULTURE, RESPIRATORY: CULTURE: NORMAL

## 2016-01-21 LAB — CULTURE, BLOOD (ROUTINE X 2)
CULTURE: NO GROWTH
CULTURE: NO GROWTH

## 2016-01-21 LAB — RENAL FUNCTION PANEL
Albumin: 3.1 g/dL — ABNORMAL LOW (ref 3.5–5.0)
Anion gap: 8 (ref 5–15)
BUN: 56 mg/dL — ABNORMAL HIGH (ref 6–20)
CHLORIDE: 101 mmol/L (ref 101–111)
CO2: 22 mmol/L (ref 22–32)
CREATININE: 2.81 mg/dL — AB (ref 0.44–1.00)
Calcium: 8.3 mg/dL — ABNORMAL LOW (ref 8.9–10.3)
GFR calc non Af Amer: 19 mL/min — ABNORMAL LOW (ref >60)
GFR, EST AFRICAN AMERICAN: 21 mL/min — AB (ref >60)
Glucose, Bld: 176 mg/dL — ABNORMAL HIGH (ref 65–99)
POTASSIUM: 4.5 mmol/L (ref 3.5–5.1)
Phosphorus: 4.6 mg/dL (ref 2.5–4.6)
Sodium: 131 mmol/L — ABNORMAL LOW (ref 135–145)

## 2016-01-21 LAB — PREPARE RBC (CROSSMATCH)

## 2016-01-21 MED ORDER — PRISMASOL BGK 4/2.5 32-4-2.5 MEQ/L IV SOLN
INTRAVENOUS | Status: DC
  Administered 2016-01-21 – 2016-01-26 (×8): via INTRAVENOUS_CENTRAL
  Filled 2016-01-21 (×10): qty 5000

## 2016-01-21 MED ORDER — PRISMASOL BGK 4/2.5 32-4-2.5 MEQ/L IV SOLN
INTRAVENOUS | Status: DC
  Administered 2016-01-21 – 2016-01-26 (×36): via INTRAVENOUS_CENTRAL
  Filled 2016-01-21 (×43): qty 5000

## 2016-01-21 MED ORDER — SODIUM CHLORIDE 0.9 % IV SOLN: Freq: Once | INTRAVENOUS | Status: DC

## 2016-01-21 MED ORDER — HEPARIN SODIUM (PORCINE) 1000 UNIT/ML IJ SOLN
3000.0000 [IU] | Freq: Once | INTRAMUSCULAR | Status: AC
  Filled 2016-01-21: qty 3

## 2016-01-21 MED ORDER — VITAL 1.5 CAL PO LIQD
1000.0000 mL | ORAL | Status: DC
  Administered 2016-01-21 – 2016-01-22 (×2): 1000 mL
  Filled 2016-01-21 (×3): qty 1000

## 2016-01-21 MED ORDER — BISACODYL 10 MG RE SUPP
10.0000 mg | Freq: Once | RECTAL | Status: AC
  Administered 2016-01-21: 10 mg via RECTAL
  Filled 2016-01-21: qty 1

## 2016-01-21 MED ORDER — PRISMASOL BGK 4/2.5 32-4-2.5 MEQ/L IV SOLN
INTRAVENOUS | Status: DC
  Administered 2016-01-21 – 2016-01-25 (×5): via INTRAVENOUS_CENTRAL
  Filled 2016-01-21 (×9): qty 5000

## 2016-01-21 MED ORDER — ALTEPLASE 2 MG IJ SOLR
2.0000 mg | Freq: Once | INTRAMUSCULAR | Status: DC | PRN
  Filled 2016-01-21: qty 2

## 2016-01-21 MED ORDER — VANCOMYCIN HCL IN DEXTROSE 1-5 GM/200ML-% IV SOLN: 1000.0000 mg | INTRAVENOUS | Status: DC

## 2016-01-21 MED ORDER — HEPARIN SODIUM (PORCINE) 1000 UNIT/ML DIALYSIS
1000.0000 [IU] | INTRAMUSCULAR | Status: DC | PRN
  Administered 2016-01-26: 1600 [IU] via INTRAVENOUS_CENTRAL
  Filled 2016-01-21 (×2): qty 6

## 2016-01-21 MED ORDER — VANCOMYCIN HCL IN DEXTROSE 1-5 GM/200ML-% IV SOLN
1000.0000 mg | Freq: Once | INTRAVENOUS | Status: AC
  Administered 2016-01-21: 1000 mg via INTRAVENOUS
  Filled 2016-01-21: qty 200

## 2016-01-21 MED ORDER — PRO-STAT SUGAR FREE PO LIQD
30.0000 mL | Freq: Two times a day (BID) | ORAL | Status: DC
  Administered 2016-01-21 – 2016-01-24 (×7): 30 mL
  Filled 2016-01-21 (×7): qty 30

## 2016-01-21 MED ORDER — HEPARIN SODIUM (PORCINE) 1000 UNIT/ML DIALYSIS
1000.0000 [IU] | INTRAMUSCULAR | Status: DC | PRN
  Administered 2016-01-21: 4000 [IU] via INTRAVENOUS_CENTRAL
  Administered 2016-01-26: 1600 [IU] via INTRAVENOUS_CENTRAL
  Filled 2016-01-21 (×3): qty 6

## 2016-01-21 MED ORDER — SODIUM CHLORIDE 0.9 % FOR CRRT
1000.0000 mL | INTRAVENOUS_CENTRAL | Status: DC | PRN
Start: 2016-01-21 — End: 2016-01-27
  Administered 2016-01-24: 1000 mL via INTRAVENOUS_CENTRAL
  Filled 2016-01-21 (×2): qty 1000

## 2016-01-21 MED ORDER — SODIUM CHLORIDE 0.9 % IV SOLN
1.0000 g | Freq: Two times a day (BID) | INTRAVENOUS | Status: DC
  Administered 2016-01-21 – 2016-01-24 (×6): 1 g via INTRAVENOUS
  Filled 2016-01-21 (×6): qty 1

## 2016-01-21 MED ORDER — HEPARIN 1000 UNIT/ML FOR PERITONEAL DIALYSIS: 4000.0000 [IU] | INTRAMUSCULAR | Status: DC | PRN

## 2016-01-21 NOTE — Progress Notes (Signed)
Pharmacy Antibiotic Note  Roque CashLaura J Mroczkowski is a 50 y.o. female admitted on 01/25/2016 with pneumonia. Vancomycin and  cefepime were started on admission and flagyl added later for suspected aspiration PNA. Vancomycin discontinued 9/3 as MRSA PCR negative.  Patient condition has deteriorated and now on paralytic.  Abx changed to merrem and vancomycin on 9/7.  Today, 01/21/2016: - afeb, wbc up -  scr trended up to 3.22, no UOP doc -  To start CRRT today  Plan: - vancomycin 1000 mg IV x1 now.  If to start CRRT today, will adjust regimen to 1000 mg IV q24h.  If no CRRT, will recheck level in 1-2 days and redose if <20 - continue merrem 500 mg IV q12h for now.  Will adjust to 1gm IV q12h when patient is on CRRT  ___________________________________  Height: 5\' 6"  (167.6 cm) Weight: 178 lb 9.2 oz (81 kg) IBW/kg (Calculated) : 59.3  Temp (24hrs), Avg:98.3 F (36.8 C), Min:97.5 F (36.4 C), Max:98.7 F (37.1 C)   Recent Labs Lab 01/16/16 0045 01/16/16 0145 01/16/16 0405 01/18/16 0352 01/18/16 2136 01/19/16 0340 01/19/16 1234 01/20/16 0330 01/20/16 1642 01/21/16 0433  WBC  --   --  11.8* 16.5* 16.2* 16.3*  --  17.0*  --  19.1*  CREATININE  --   --  0.53 1.39* 1.45* 1.50*  1.46*  --  2.35* 2.85* 3.22*  LATICACIDVEN 1.58 1.7 1.5  --   --  1.3 1.3  --   --   --     Estimated Creatinine Clearance: 22.4 mL/min (by C-G formula based on SCr of 3.22 mg/dL).    Allergies  Allergen Reactions  . Avelox [Moxifloxacin Hcl In Nacl] Shortness Of Breath, Itching and Rash    GI Upset  . Bee Venom Anaphylaxis  . Doxycycline Nausea Only and Rash  . Amoxicillin Rash    Upset stomach   . Erythromycin Rash    Upset stomach  . Sulfa Antibiotics Rash    Upset stomach    Antimicrobials this admission: 9/2 Vancomycin >> 9/3>> resumed 9/7>> 9/2 Cefepime >> 9/7 9/5 Flagyl >> 9/7 9/7 merrem>>  Levels/dose changes this admission: 9/8 VR at 1200 =  14 (~25 hr after 1gm dose)--> 1gm x1    Microbiology results: 9/2 BCx x2:  9/3 UCx: NGF 9/3 MRSA PCR: neg 9/6 BCx x2: 9/6 Trach asp:    Thank you for allowing pharmacy to be a part of this patient's care.  Dorna LeitzAnh Scotti Motter, PharmD, BCPS 01/21/2016 11:26 AM

## 2016-01-21 NOTE — Progress Notes (Signed)
Per husband, call pt's brother Aurther Lofterry if there are any changes with the patient overnight (01/21/2016). After this date call husband as primary.  Noralyn PickMargaret R Thamar Holik, RN

## 2016-01-21 NOTE — Progress Notes (Signed)
PULMONARY / CRITICAL CARE MEDICINE   Name: Kristina Morris MRN: [id number] DOB: 27-Jul-1965    ADMISSION DATE:  02/08/2016 CONSULTATION DATE:  01/19/16  REFERRING MD:  Cena Benton - TRH  CHIEF COMPLAINT:  AMS, increased WOB  BRIEF: 50 y/o female with EtOH cirrhosis admitted on 9/2 with CAP, aspirated on 9/6 requiring intubation.  Developed ARDS subsequently.  SUBJECTIVE:  Sedated heavily on vent, in shock, on vasopressors, creatinine worse   VITAL SIGNS: BP (!) 114/47   Pulse (!) 105   Temp 98 F (36.7 C) (Oral)   Resp (!) 28   Ht 5\' 6"  (1.676 m)   Wt 178 lb 9.2 oz (81 kg)   SpO2 97%   BMI 28.82 kg/m   HEMODYNAMICS: CVP:  [21 mmHg] 21 mmHg  VENTILATOR SETTINGS: Vent Mode: PRVC FiO2 (%):  [70 %-100 %] 70 % Set Rate:  [28 bmp] 28 bmp Vt Set:  [360 mL] 360 mL PEEP:  [12 cmH20-14 cmH20] 14 cmH20 Plateau Pressure:  [27 cmH20-32 cmH20] 30 cmH20  INTAKE / OUTPUT: I/O last 3 completed shifts: In: 7263.8 [I.V.:3696; Blood:273; Other:6.5; WU/JW:1191.4; IV Piggyback:1400] Out: 61 [Urine:61]   PHYSICAL EXAMINATION: General: sedated on vent, also paralyzed HENT: scleral icterus noted, PERRL PULM: Crackles bilaterally, vent supported breaths CV: Tachy, regular, no mgr GI: BS+, distended abdomen but non-tender Derm: scattered ecchymoses, notable edema throughout, some jaundice Neuro: Sedated heavily on vent  LABS:  BMET  Recent Labs Lab 01/20/16 0330 01/20/16 1642 01/21/16 0433  NA 131* 129* 128*  K 3.8 4.5 4.6  CL 101 100* 100*  CO2 21* 19* 20*  BUN 47* 51* 63*  CREATININE 2.35* 2.85* 3.22*  GLUCOSE 156* 280* 226*    Electrolytes  Recent Labs Lab 01/19/16 1555 01/20/16 0330 01/20/16 1642 01/21/16 0433  CALCIUM  --  8.4* 8.2* 8.7*  MG 1.7 2.2 2.1  --   PHOS 5.5* 4.4 4.5  --     CBC  Recent Labs Lab 01/19/16 0340 01/20/16 0330 01/21/16 0433  WBC 16.3* 17.0* 19.1*  HGB 8.1* 7.2* 6.7*  HCT 23.7* 21.3* 20.3*  PLT 71* 69* 55*    Coag's  Recent  Labs Lab 01/16/16 0405 01/19/16 1709  APTT  --  45*  INR 2.24 2.67    Sepsis Markers  Recent Labs Lab 01/16/16 0405 01/19/16 0340 01/19/16 0714 01/19/16 1234  LATICACIDVEN 1.5 1.3  --  1.3  PROCALCITON  --   --  0.54  --     ABG  Recent Labs Lab 01/19/16 0930 01/19/16 1113 01/20/16 1315  PHART 7.342* 7.335* 7.244*  PCO2ART 42.0 42.6 44.6  PO2ART 50.8* 139* 163*    Liver Enzymes  Recent Labs Lab 01/14/2016 2115 01/16/16 0405 01/18/16 2136  AST 82* 72* 68*  ALT 35 32 30  ALKPHOS 159* 140* 137*  BILITOT 16.6* 15.5* 17.3*  ALBUMIN 2.6* 2.4* 2.3*    Cardiac Enzymes  Recent Labs Lab 01/19/16 0340 01/19/16 0923 01/19/16 1555  TROPONINI <0.03 <0.03 <0.03    Glucose  Recent Labs Lab 01/20/16 1203 01/20/16 1547 01/20/16 2006 01/20/16 2319 01/21/16 0511 01/21/16 0800  GLUCAP 246* 264* 248* 249* 225* 236*    Imaging Dg Chest Port 1 View  Result Date: 01/21/2016 CLINICAL DATA:  Respiratory failure. EXAM: PORTABLE CHEST 1 VIEW COMPARISON:  01/20/2016 . FINDINGS: Endotracheal tube, NG tube in stable position. Left IJ line stable position. Heart size stable. Diffuse bilateral dense airspace disease again noted. No prominent pleural effusion.  No pneumothorax .  IMPRESSION: 1. Lines and tubes in stable position. 2. Diffuse dense bilateral airspace and disease again noted. No interim change Electronically Signed   By: Maisie Fushomas  Register   On: 01/21/2016 07:25  RUL a little better aerated but no overall change. Tubes and lines in good position    STUDIES:  CXR 9/5 > multifocal PNA 9/7 Echo> EF 65-70% no wall motion abnormalities. PAS estimated at 32 mmHg peak.   CULTURES: Blood 9/2 > Urine 9/2 > neg Sputum 9/6 > Blood 9/6 >   ANTIBIOTICS: Cefepime 9/2 > 9/7 Flagyl 9/5 > 9/7 Meropenem 9/7 >  Vanc 9/7 >   SIGNIFICANT EVENTS: 9/2 > admit 9/6 > PCCM called, pt intubated due to increased WOB 9/7 worsening hypoxia/ARDS, started on NMB- also progressive  shock/MODS 9/8 HD cath placed. Start CRRT  LINES/TUBES: ETT 9/6 > L IJ CVL 9/7 >   DISCUSSION: 50 y.o. female with ETOH cirrhosis, admitted 9/2 with HCAP. Had deterioration 9/5 after aspiration event and required intubation early AM hours 9/6 due to increased WOB and hypoxia. As of 01/21/2016 has ARDS and worsening multiorgan failure, critically ill and worsening overall condition. Will place HD cath. Will need to start CRRT. Will cont aggressive support over weekend. Hopefully we can make some progress.   ASSESSMENT / PLAN:  PULMONARY A: ARDS Aspiration PNA Pulm edema from cirrhosis, renal failure P:   ARDS protocol VAP prevention Daily CXR ABG post change to ARDS protocol Cont  Nimbex Volume removal w/ CRRT  CARDIOVASCULAR A:  SVT 01/20/2016 AM > likely levophed related Septic shock 01/20/2016, currently volume replete, complicated by cirrhosis, Hx HLD P:  Monitor hemodynamics and tele cont hydrocortisone Titrate neosynephrine for MAP > 55; cont vasopressin-->this will be the first we stop Hold preadmission furosemide, spironolactone   NEUROLOGIC A:   Acute encephalopathy: multifactorial in setting of hepatic encephalopathy and respiratory fialure Hx depression, ETOH use (reportedly now abstinent) P:   Changed to nimbex due to ARDS RASS goal -5 on Nimbex No wake up assessment Continue folic acid, thiamine Hold preadmission cyclobenzaprine, sertraline, hydroxyzine  GASTROINTESTINAL A:   Hepatic encephalopathy Hx ETOH cirrhosis, fatty liver Protein calorie malnutrition GI prophylaxis Constipation Nutrition P:   Increased lactulose to tid Continue tube feedings pepcid for stress ulcer prophylaxis Dulcolax supp  RENAL A:   Hyponatremia - chronic, stable AKI > worsening 01/21/2016  Metabolic acidosis  Pseudohypocalcemia - corrects to 10.06 P:   Renal consulted Need volume removal when able-->plan for CRRT today  Cont I&O  HEMATOLOGIC A:   Anemia -  chronic-->hgb drifting; suspect dilutional to some degree Thrombocytopenia - chronic VTE Prophylaxis P:  Transfuse for Hgb < 7 (recieved 1 unit am 9/8) Monitor platelet counts Cont subcutaneous heparin for DVT prohylaxis Cont SCD's CBC in AM  INFECTIOUS A:   Aspiration PNA Septic shock P:   cont meropenem and vancomycin F/u cultures  ENDOCRINE A:   Hyperglycemia  P:   ssi while on steroids    Family updated: Updated husband bedside 01/21/2016  Interdisciplinary Family Meeting v Palliative Care Meeting:  Due by: 9/13.  Simonne MartinetPeter E Charnell Peplinski ACNP-BC [facility] Pulmonary/Critical Care Pager # [phone] OR # [phone] if no answer  01/21/2016, 10:32 AM

## 2016-01-21 NOTE — Procedures (Signed)
Central Venous dialysis Catheter Insertion Procedure Note Kristina CashLaura J Swords [id number] [birth year]  Procedure: Insertion of Central Venous Catheter Indications: hemodialysis   Procedure Details Consent: Risks of procedure as well as the alternatives and risks of each were explained to the (patient/caregiver).  Consent for procedure obtained. Time Out: Verified patient identification, verified procedure, site/side was marked, verified correct patient position, special equipment/implants available, medications/allergies/relevent history reviewed, required imaging and test results available.  Performed Real time US was used to ID and cannulate the vessel   Maximum sterile technique was used including antiseptics, cap, gloves, gown, hand hygiene, mask and sheet. Skin prep: Chlorhexidine; local anesthetic administered A antimicrobial bonded/coated triple lumen catheter was placed in the right femoral vein due to patient being a dialysis patient using the Seldinger technique.  Evaluation Blood flow good Complications: No apparent complications Patient did tolerate procedure well.  ** of note I evaluated the right IJ for cannulation initially. The caliber of the vessel was not large enough for an HD vessel.   Shelby Mattocksete E Swayze Kozuch 01/21/2016, 11:50 AM  Simonne MartinetPeter E Kendale Rembold ACNP-BC [facility] Pulmonary/Critical Care Pager # [phone] OR # [phone] if no answer

## 2016-01-21 NOTE — Progress Notes (Signed)
eLink Physician-Brief Progress Note Patient Name: Kristina CashLaura J Morris DOB: 04-01-1966 MRN: [id number]    Recent Labs Lab 01/18/16 0352 01/18/16 2136 01/19/16 0340 01/20/16 0330 01/21/16 0433  HGB 8.1* 8.2* 8.1* 7.2* 6.7*     Anemia of criticial illness  Plan 1 unit prbc    Intervention Category Intermediate Interventions: Other:  Mycal Conde 01/21/2016, 5:33 AM

## 2016-01-21 NOTE — Progress Notes (Addendum)
Nutrition Follow-up  DOCUMENTATION CODES:   Not applicable  INTERVENTION:  - Will change TF regimen: Vital 1.5 @ 40 mL/hr with 30 mL Prostat BID. This regimen will provide 1640 kcal, 95 grams of protein, 5.76 grams of fiber, and 733 mL free water.  - Continue PEPuP protocol. - Free water flush per MD/NP, if desired. - Multivitamin order per MD/NP, if desired, as current TF rate does not meet 100% micronutrient needs.  - RD will follow-up 9/10.  NUTRITION DIAGNOSIS:   Inadequate oral intake related to inability to eat as evidenced by NPO status. -ongoing  GOAL:   Patient will meet greater than or equal to 90% of their needs -met with current regimen.  MONITOR:   Vent status, TF tolerance, Weight trends, Labs, I & O's  ASSESSMENT:   50 y.o. female with a past medical history significant for alcoholic cirrhosis with recent liver failure who presents with chest pain and worsening pneumonia. The patient was admitted to the hospitalist service here in mid June for acute liver failure in the setting of alcoholic cirrhosis. She was seen by GI, started on steroids, Lasix, and spironolactone, received paracentesis (no SBP) and was discharged with GI follow up with her hepatologist at [facility]. Since then, she has stabilized, had well controlled leg swelling, no more ascites and completed her course of steroids. She is abstinent from alcohol now. About 4 days ago, she started to develop some chest pain, fever and shortness of breath.  She was seen in our ER, had a new left lower lobe infiltrate on CXR but was without hypoxia or leukocytosis and was prescribed azithromycin and discharged.  Since then, she has taken the azithromycin but feels progressively worse pain and dyspnea so today she called EMS.  EMS found her hypoxic in the high 80s, but placed CPAP because they couldn't improve her O2 sat with supplemental O2  9/8 Pt with OGT and currently receiving Vital 1.2 @ 55 mL/hr which is  providing 1584 kcal (96% re-estimated kcal need), 99 grams of protein, and 1071 mL free water. Kcal need updated this AM and continue to use admission weight of 68.9 kg as weight +12.1 kg since admission. Per notes, pt on ARDS protocol.   Spoke with RN at bedside who reports that pt has not had BM in 3 days and that recording from 9/6 was a smear. She states that Lactulose has been ordered and is being administered but concern about TF volume remains in the setting of cirrhosis with ascites and no BM.   Husband is at bedside and reports that outpatient GI doctor had encouraged pt to use Miralax; husband states that this had been working very well for pt. He states that GI MD had discouraged use of Metamucil d/t fiber with cirrhosis. This is likely d/t possible delayed gastric emptying in the setting of cirrhosis. Will continue low fiber formula at this time and recommend trial of Miralax; will adjust TF as needed if this adjustment does not produce BM.   Based on labs, worsening renal function and low Na. Will use more concentrated TF formula as RN reports pt +10L since admission.   Patient is currently intubated on ventilator support MV: 9.7 L/min Temp (24hrs), Avg:98.3 F (36.8 C), Min:97.5 F (36.4 C), Max:98.7 F (37.1 C) BP: 113/50; MAP: 66 at time of RD visit.   Medications reviewed; 75 g albumin x1 dose yesterday, 1 mg IV folic acid/day, 50 mg IV Solu-Cortef QID, sliding scale Novolog, 20 g  lactulose TID, PRN IV Zofran, 100 mg IV thiamine,   Labs reviewed; CBGs: 225 and 236 mg/dL this AM, Na: 128 mmol/L, Cl: 100 mmol/L, BUN: 63 mg/dL, creatinine: 3.22 mg/dL, Ca: 8.7 mg/dL, GFR: 18 mL/min.   Drips: Nimbex @ 2 mcg/kg/min, Versed @ 2 mg/hr, Vaso @ 0.03 units/min, Neo @ 90 mcg/min, Fentanyl @ 100 mcg/hr.     9/7 - Pt currently receiving TF at goal rate: Vital 1.2 @ 50 mL/hr which is providing 1440 kcal, 90 grams of protein, and 973 mL free water.  - Estimated kcal need updated this AM  and continue to use admission weight of 68.9 kg as pt now +6.5 kg since admission.  - Pt to be started on Nimbex today.  - RN reports pt appears to be tolerating TF, but no BM today. Flow sheet indicates pt had BM yesterday.   Patient is currently intubated on ventilator support MV: 8.9 L/min Temp (24hrs), Avg:98.9 F (37.2 C), Max:100.1 F (37.8 C)  Notes indicate goal MAP >65. K, Mg and Phos WDL with Mg up and Phos down since yesterday.  Drips: Versed @ 2 mg/hr, Levo @ 4 mcg/min, Vaso @ 0.03 units/min, Neo @ 115 mcg/min, Fentanyl @ 125 mcg/hr.    9/6 - Per notes, pt with possible aspiration following oral Lactulose administration yesterday; she was intubated this AM at ~0325. - Pt was previously on Regular diet with 5% of breakfast and lunch consumed yesterday. - Pt with hx of alcohol abuse, alcoholic cirrhosis with ascites.  - Unsure if pt is at risk for refeeding but will order TF as outlined above to limit possibility of refeeding.  - Physical assessment shows no muscle or fat wasting, moderate edema to BLE note.  - Pt has gained 3.4 kg since admission.  - Used admission weight of 68.9 kg to estimate needs.   Patient is currently intubated on ventilator support with OGT in place.  MV: 7.6L/min Temp (24hrs), Avg:98.1 F (36.7 C), Max:98.7 F (37.1 C)  At time of visit: BP of 100/34 and MAP of 57. - Phos: 5 mg/dL, K and Mg WDL.  Drip:Fentanyl @ 200 mcg/hr.    Diet Order:   NPO  Skin:  Reviewed, no issues  Last BM:  9/6  Height:   Ht Readings from Last 1 Encounters:  01/21/16 _0  (1.676 m)    Weight:   Wt Readings from Last 1 Encounters:  01/21/16 178 lb 9.2 oz (81 kg)    Ideal Body Weight:  59.09 kg  BMI:  Body mass index is 28.82 kg/m.  Estimated Nutritional Needs:   Kcal:  7116  Protein:  83-103 grams (1.2-1.5 grams/kg)  Fluid:  1.2-1.5 L/day  EDUCATION NEEDS:   No education needs identified at this time    Jarome Matin, MS,  RD, LDN Inpatient Clinical Dietitian Pager # [phone] After hours/weekend pager # [phone]

## 2016-01-21 NOTE — Progress Notes (Signed)
eLink Physician-Brief Progress Note Patient Name: Kristina CashLaura J Morris DOB: 1965-10-04 MRN: [id number]   Date of Service  01/21/2016  HPI/Events of Note  Liquid stool - Currently on Lactulose and TF's.   eICU Interventions  Will order: 1. Place Flexiseal.      Intervention Category Intermediate Interventions: Other:  Lenell AntuSommer,Jarone Ostergaard Eugene 01/21/2016, 6:26 PM

## 2016-01-21 NOTE — Progress Notes (Signed)
Pharmacy note: Antibiotic dose adjustment with initiation of CRRT  - Vancomycin 1gm q24, to begin 9/9 at 1300 - Meropenem increased to 1gm q12, to begin tonight at 2200  Otho BellowsGreen, Brett Soza L PharmD Pager [phone] 01/21/2016, 7:25 PM

## 2016-01-21 NOTE — Progress Notes (Signed)
CRITICAL VALUE ALERT  Critical value received:  Hgb 6.7  Date of notification:  01/21/2016  Time of notification:  0451  Critical value read back:Yes.    Nurse who received alert:  Bosie HelperS. Hayle Parisi, RN  MD notified (1st page):  Pola CornElink MD   Time of first page:  [phone]  MD notified (2nd page):  Time of second page:  Responding MD:  Pola CornElink MD Time MD responded:  [phone]

## 2016-01-21 NOTE — Progress Notes (Signed)
Tube feed stopped during hemodialysis catheter insertion. Per NP verbally confirmed line is okay to use. X-ray is not required.  Noralyn PickMargaret R Melanny Wire, RN

## 2016-01-21 NOTE — Progress Notes (Signed)
Pt SpO2 dropped after receiving bath.  Pt given several O2 breaths but still required PEEP be returned to 14 cm H2O.  RT will continue to monitor and ween PEEP/FiO2 as tolerated.

## 2016-01-21 NOTE — Consult Note (Signed)
Renal Service Consult Note [facility]  Kristina Morris 01/21/2016 Roney Jaffe D Requesting Physician:  Dr. Vaughan Browner  Reason for Consult:  Acute renal failure/ vol overload/ sepsis HPI: The patient is a 50 y.o. year-old with history of etoh abuse and cirrosis / ascites, presented on 02/01/2016 with comm-acquired PNA.  Developed ARDS subsequently and now in shock, on vent with AKI/ rising creatinine.  On paralytics. CXR diffuse air-space disease.  Off pressors today. Low grade temps have improved.  WBC rising.  Na 128, K 4.6 Cr 3.22 eGFR 17  Glu 226  .  WBC 19k  Hb 6.7  plt 55. Making urine, UOP 1.9 L yesterday. Creat 0.5 at baseline, up to 2.8 yest and 3.22 today. Asked to see for AKI.       Past Medical History  Past Medical History:  Diagnosis Date  . Alcoholic ([place])   . Chicken pox   . Cirrhosis ([place])   . Depression   . Fatty liver   . Hyperlipidemia   . Mitral valve prolapse   . Seasonal allergies    Past Surgical History  Past Surgical History:  Procedure Laterality Date  . cysts removal, sinus    . OVARIAN CYST REMOVAL    . TONSILLECTOMY AND ADENOIDECTOMY    . 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 Liver disease Brother     older brother  . Depression Mother   . Breast cancer Mother   . Osteoporosis Mother   . Liver disease Maternal Aunt   . Liver disease Paternal Aunt   . Alcoholism      both sides of family  . Diabetes Brother   . Hypertension Brother     older brother  . Cancer      both sides of family  . Heart disease Father   . Heart disease Maternal Grandmother    Social History  reports that she has been smoking Cigarettes.  She has never used smokeless tobacco. She reports that she drinks alcohol. She reports that she does not use drugs. Allergies  Allergies  Allergen Reactions  . Avelox [Moxifloxacin Hcl In Nacl] Shortness Of Breath, Itching and Rash    GI Upset  . Bee Venom Anaphylaxis   . Doxycycline Nausea Only and Rash  . Amoxicillin Rash    Upset stomach   . Erythromycin Rash    Upset stomach  . Sulfa Antibiotics Rash    Upset stomach   Home medications Prior to Admission medications   Medication Sig Start Date End Date Taking? Authorizing Provider  b complex vitamins tablet Take 1 tablet by mouth 2 (two) times a week. Reported on 11/02/2015   Yes Historical Provider, MD  BIOTIN PO Take 10,000 mcg by mouth daily. Reported on 11/02/2015   Yes Historical Provider, MD  calcium carbonate (OS-CAL) 600 MG TABS tablet Take 1 tablet (600 mg total) by mouth 2 (two) times daily with a meal. Reported on 11/02/2015 01/07/16  Yes Mosie Lukes, MD  cyclobenzaprine (FLEXERIL) 5 MG tablet take 1 tablet by mouth at bedtime Patient taking differently: take 1 tablet by mouth at bedtime as needed for sleep/spasms 12/15/15  Yes Midge Minium, MD  EPINEPHrine 0.3 mg/0.3 mL IJ SOAJ injection Inject 0.3 mg into the muscle as needed. Reported on 11/02/2015   Yes Historical Provider, MD  fexofenadine (ALLEGRA) 180 MG tablet Take 180 mg by mouth as needed for allergies.  Reported on 11/02/2015   Yes Historical Provider, MD  fluticasone (FLONASE) 50 MCG/ACT nasal spray Place 2 sprays into both nostrils daily as needed for allergies. Reported on 11/02/2015   Yes Historical Provider, MD  folic acid (FOLVITE) 680 MCG tablet Take 800 mcg by mouth daily.   Yes Historical Provider, MD  Multiple Vitamin (DAILY MULTIVITAMIN PO) Take 1 tablet by mouth daily.   Yes Historical Provider, MD  sertraline (ZOLOFT) 100 MG tablet Take 1 tablet (100 mg total) by mouth every evening. 02/01/15  Yes Midge Minium, MD  spironolactone (ALDACTONE) 50 MG tablet Take 1 tablet (50 mg total) by mouth daily. 10/31/15  Yes Debbe Odea, MD  sulfamethoxazole-trimethoprim (BACTRIM,SEPTRA) 400-80 MG tablet Take 1 tablet by mouth daily. 11/20/15  Yes Historical Provider, MD  traMADol (ULTRAM) 50 MG tablet take 1 tablet by mouth  every 12 hours if needed Patient taking differently: Take 50 mg by mouth every 12 (twelve) hours as needed (for pain). take 1 tablet by mouth every 12 hours if needed 12/27/15  Yes Debbrah Alar, NP  vitamin E 400 UNIT capsule Take 400 Units by mouth daily. Reported on 11/02/2015   Yes Historical Provider, MD  ibuprofen (ADVIL,MOTRIN) 200 MG tablet Take 200 mg by mouth every 6 (six) hours as needed for moderate pain. Reported on 11/02/2015    Historical Provider, MD   Liver Function Tests  Recent Labs Lab 01/28/2016 2115 01/16/16 0405 01/18/16 2136  AST 82* 72* 68*  ALT 35 32 30  ALKPHOS 159* 140* 137*  BILITOT 16.6* 15.5* 17.3*  PROT 6.6 5.9* 6.3*  ALBUMIN 2.6* 2.4* 2.3*   No results for input(s): LIPASE, AMYLASE in the last 168 hours. CBC  Recent Labs Lab 01/21/2016 2115  01/18/16 2136 01/19/16 0340 01/20/16 0330 01/21/16 0433  WBC 13.0*  < > 16.2* 16.3* 17.0* 19.1*  NEUTROABS 9.1*  --  13.3*  --   --  16.7*  HGB 9.0*  < > 8.2* 8.1* 7.2* 6.7*  HCT 25.9*  < > 24.4* 23.7* 21.3* 20.3*  MCV 100.4*  < > 102.1* 102.6* 105.4* 107.4*  PLT 88*  < > 77* 71* 69* 55*  < > = values in this interval not displayed. Basic Metabolic Panel  Recent Labs Lab 01/16/16 0405 01/18/16 0352 01/18/16 2136 01/19/16 0340 01/19/16 0923 01/19/16 1555 01/20/16 0330 01/20/16 1642 01/21/16 0433  NA 134* 133* 131* 132*  133*  --   --  131* 129* 128*  K 3.5 4.2 4.1 4.1  4.1  --   --  3.8 4.5 4.6  CL 101 101 101 101  102  --   --  101 100* 100*  CO2 _0 --   --  21* 19* 20*  GLUCOSE 119* 121* 135* 123*  123*  --   --  156* 280* 226*  BUN 12 28* 36* 39*  39*  --   --  47* 51* 63*  CREATININE 0.53 1.39* 1.45* 1.50*  1.46*  --   --  2.35* 2.85* 3.22*  CALCIUM 8.0* 8.9 8.7* 8.6*  8.7*  --   --  8.4* 8.2* 8.7*  PHOS  --   --   --  4.7* 5.0* 5.5* 4.4 4.5  --    Iron/TIBC/Ferritin/ %Sat    Component Value Date/Time   IRON 80 10/28/2015 0943   TIBC NOT CALCULATED 10/28/2015  0943   FERRITIN 1,234 (H) 10/28/2015 0943   IRONPCTSAT NOT CALCULATED 10/28/2015 3212  Vitals:   01/21/16 1100 01/21/16 1115 01/21/16 1200 01/21/16 1300  BP:      Pulse:  (!) 105    Resp: (!) 28 (!) 28 (!) 25 (!) 28  Temp:   98.3 F (36.8 C)   TempSrc:   Oral   SpO2: 96% 100% 98% 98%  Weight:      Height:       Exam Gen on vent, sedated, blood coming from mouth, gurgling respirations noted No rash, cyanosis or gangrene Throat w ETT  Chest coarse BS bilat RRR no MRG tachy Abd soft ntnd no mass + ascites, moderate GU foley in place MS no joint effusions or deformity Ext diffuse 2-3+ edema of arms and legs / no wounds or ulcers Neuro as above  UA > orange, 0-5 rbc/ wbc, 1.014 sg, many bact INR 2.6 plts 55  wBC 18k  Hb 6.7  Assessment: 1. Acute kidney injury - severe w pulm edema / vol overload.  Nonoliguric.  Agree w CRRT for volume removal, try to improve resp status.   2. Massive vol overload - up 20kg from admission 6 d ago 3. VDRF - ARDS/CAP +/- pulm edema 4. Alcoholic cirrhosis 5. Thrombocytopenia 6. Jeannie Done 7. Anemia   Plan - CRRT, see orders  Kelly Splinter MD [facility] pager [phone]    cell [phone] 01/21/2016, 2:20 PM

## 2016-01-21 NOTE — Progress Notes (Signed)
eLink Physician-Brief Progress Note Patient Name: Kristina CashLaura J Noffke DOB: 1966-01-01 MRN: [id number]   Date of Service  01/21/2016  HPI/Events of Note  Contacted by bedside nurse regarding borderline blood pressure on CRRT. Also, patient currently on paralytic with 4 out of 4 train-of-four. Reportedly RASS -5 on Versed drip at 1 mg per hour IV & fentanyl drip at 75 g per hour IV. Currently on Nimbex drip.   eICU Interventions  1. Continuing Nimbex drip and titrating to 2 out of 4 train-of-four 2. Increasing Versed drip to 2 mg per hour and fentanyl drip to 100 g per hour 3. Continuing gentle diuresis with CRRT 4. Utilizing Neo-Synephrine with goal mean arterial pressure greater than 55 after reviewing rounding note      Intervention Category Major Interventions: Respiratory failure - evaluation and management;Acute renal failure - evaluation and management  Lawanda CousinsJennings Nachman Sundt 01/21/2016, 9:50 PM

## 2016-01-21 NOTE — Progress Notes (Signed)
Pt. Having continual loose stool from rectum. While cleaning up, e- link notified and flexiseal requested from Md. Per e-link nurse, order to be put in.  Noralyn PickMargaret R Naraly Fritcher, RN

## 2016-01-22 ENCOUNTER — Inpatient Hospital Stay (HOSPITAL_COMMUNITY): Payer: 59

## 2016-01-22 DIAGNOSIS — J80 Acute respiratory distress syndrome: Secondary | ICD-10-CM

## 2016-01-22 LAB — BASIC METABOLIC PANEL
ANION GAP: 8 (ref 5–15)
BUN: 49 mg/dL — ABNORMAL HIGH (ref 6–20)
CO2: 23 mmol/L (ref 22–32)
Calcium: 8.3 mg/dL — ABNORMAL LOW (ref 8.9–10.3)
Chloride: 104 mmol/L (ref 101–111)
Creatinine, Ser: 2.24 mg/dL — ABNORMAL HIGH (ref 0.44–1.00)
GFR, EST AFRICAN AMERICAN: 28 mL/min — AB (ref >60)
GFR, EST NON AFRICAN AMERICAN: 24 mL/min — AB (ref >60)
GLUCOSE: 183 mg/dL — AB (ref 65–99)
POTASSIUM: 4 mmol/L (ref 3.5–5.1)
Sodium: 135 mmol/L (ref 135–145)

## 2016-01-22 LAB — RENAL FUNCTION PANEL
ALBUMIN: 2.8 g/dL — AB (ref 3.5–5.0)
Albumin: 2.9 g/dL — ABNORMAL LOW (ref 3.5–5.0)
Anion gap: 8 (ref 5–15)
Anion gap: 7 (ref 5–15)
BUN: 46 mg/dL — ABNORMAL HIGH (ref 6–20)
BUN: 39 mg/dL — AB (ref 6–20)
CALCIUM: 8.4 mg/dL — AB (ref 8.9–10.3)
CHLORIDE: 103 mmol/L (ref 101–111)
CHLORIDE: 103 mmol/L (ref 101–111)
CO2: 23 mmol/L (ref 22–32)
CO2: 25 mmol/L (ref 22–32)
CREATININE: 2.22 mg/dL — AB (ref 0.44–1.00)
CREATININE: 1.75 mg/dL — AB (ref 0.44–1.00)
Calcium: 8.6 mg/dL — ABNORMAL LOW (ref 8.9–10.3)
GFR calc Af Amer: 38 mL/min — ABNORMAL LOW (ref >60)
GFR calc non Af Amer: 33 mL/min — ABNORMAL LOW (ref >60)
GFR, EST AFRICAN AMERICAN: 29 mL/min — AB (ref >60)
GFR, EST NON AFRICAN AMERICAN: 25 mL/min — AB (ref >60)
GLUCOSE: 185 mg/dL — AB (ref 65–99)
Glucose, Bld: 186 mg/dL — ABNORMAL HIGH (ref 65–99)
PHOSPHORUS: 3.3 mg/dL (ref 2.5–4.6)
POTASSIUM: 4.1 mmol/L (ref 3.5–5.1)
Phosphorus: 2.7 mg/dL (ref 2.5–4.6)
Potassium: 4 mmol/L (ref 3.5–5.1)
Sodium: 134 mmol/L — ABNORMAL LOW (ref 135–145)
Sodium: 135 mmol/L (ref 135–145)

## 2016-01-22 LAB — BLOOD GAS, ARTERIAL
Acid-base deficit: 1.5 mmol/L (ref 0.0–2.0)
Bicarbonate: 23.7 mmol/L (ref 20.0–28.0)
FIO2: 55
O2 Saturation: 99.3 %
PCO2 ART: 45.5 mmHg (ref 32.0–48.0)
PEEP/CPAP: 15 cmH2O
PH ART: 7.336 — AB (ref 7.350–7.450)
Patient temperature: 98.6
VT: 380 mL
pO2, Arterial: 135 mmHg — ABNORMAL HIGH (ref 83.0–108.0)

## 2016-01-22 LAB — CBC
HEMATOCRIT: 21.1 % — AB (ref 36.0–46.0)
HEMOGLOBIN: 7.2 g/dL — AB (ref 12.0–15.0)
MCH: 34.3 pg — AB (ref 26.0–34.0)
MCHC: 34.1 g/dL (ref 30.0–36.0)
MCV: 100.5 fL — ABNORMAL HIGH (ref 78.0–100.0)
Platelets: 54 10*3/uL — ABNORMAL LOW (ref 150–400)
RBC: 2.1 MIL/uL — AB (ref 3.87–5.11)
RDW: 22.6 % — ABNORMAL HIGH (ref 11.5–15.5)
WBC: 15.3 10*3/uL — ABNORMAL HIGH (ref 4.0–10.5)

## 2016-01-22 LAB — APTT: APTT: 47 s — AB (ref 24–36)

## 2016-01-22 LAB — GLUCOSE, CAPILLARY
GLUCOSE-CAPILLARY: 181 mg/dL — AB (ref 65–99)
Glucose-Capillary: 199 mg/dL — ABNORMAL HIGH (ref 65–99)
Glucose-Capillary: 176 mg/dL — ABNORMAL HIGH (ref 65–99)
Glucose-Capillary: 179 mg/dL — ABNORMAL HIGH (ref 65–99)
Glucose-Capillary: 183 mg/dL — ABNORMAL HIGH (ref 65–99)
Glucose-Capillary: 204 mg/dL — ABNORMAL HIGH (ref 65–99)

## 2016-01-22 LAB — MAGNESIUM: MAGNESIUM: 2.1 mg/dL (ref 1.7–2.4)

## 2016-01-22 MED ORDER — SODIUM CHLORIDE 0.9 % IV SOLN
25.0000 ug/h | INTRAVENOUS | Status: DC
  Administered 2016-01-22: 75 ug/h via INTRAVENOUS
  Administered 2016-01-22: 200 ug/h via INTRAVENOUS
  Administered 2016-01-22: 150 ug/h via INTRAVENOUS
  Administered 2016-01-23: 125 ug/h via INTRAVENOUS
  Filled 2016-01-22 (×2): qty 50

## 2016-01-22 MED ORDER — HYDROCORTISONE NA SUCCINATE PF 100 MG IJ SOLR
50.0000 mg | Freq: Two times a day (BID) | INTRAMUSCULAR | Status: DC
  Administered 2016-01-22 – 2016-01-25 (×6): 50 mg via INTRAVENOUS
  Filled 2016-01-22 (×6): qty 2

## 2016-01-22 MED ORDER — FENTANYL CITRATE (PF) 100 MCG/2ML IJ SOLN: 50.0000 ug | Freq: Once | INTRAMUSCULAR | Status: DC

## 2016-01-22 MED ORDER — MIDAZOLAM HCL 5 MG/ML IJ SOLN
0.0000 mg/h | INTRAMUSCULAR | Status: DC
  Administered 2016-01-22: 1 mg/h via INTRAVENOUS
  Administered 2016-01-22: 3 mg/h via INTRAVENOUS
  Administered 2016-01-22: 4 mg/h via INTRAVENOUS
  Filled 2016-01-22: qty 10

## 2016-01-22 MED ORDER — FENTANYL BOLUS VIA INFUSION
50.0000 ug | INTRAVENOUS | Status: DC | PRN
  Administered 2016-01-23: 50 ug via INTRAVENOUS
  Filled 2016-01-22: qty 50

## 2016-01-22 NOTE — Progress Notes (Signed)
[facility] Progress Note   Subjective: no c/o's, pt is sedated on vent  Vitals:   01/22/16 1200 01/22/16 1300 01/22/16 1400 01/22/16 1500  BP:      Pulse:      Resp: (!) 28 (!) 28 (!) 28 (!) 28  Temp: 98.4 F (36.9 C)     TempSrc: Oral     SpO2: 100% 100% 100% 100%  Weight:      Height:        Inpatient medications: . chlorhexidine  15 mL Mouth Rinse BID  . famotidine (PEPCID) IV  10 mg Intravenous Q12H  . feeding supplement (PRO-STAT SUGAR FREE 64)  30 mL Per Tube BID  . feeding supplement (VITAL 1.5 CAL)  1,000 mL Per Tube Q24H  . fentaNYL (SUBLIMAZE) injection  50 mcg Intravenous Once  . folic acid  1 mg Intravenous Daily  . heparin subcutaneous  5,000 Units Subcutaneous Q8H  . hydrocortisone sodium succinate  50 mg Intravenous Q12H  . insulin aspart  0-9 Units Subcutaneous Q4H  . lactulose  20 g Per Tube TID  . mouth rinse  15 mL Mouth Rinse QID  . meropenem (MERREM) IV  1 g Intravenous Q12H  . sodium chloride flush  10-40 mL Intracatheter Q12H  . sodium chloride flush  3 mL Intravenous Q12H  . thiamine injection  100 mg Intravenous Daily   . sodium chloride 10 mL/hr at 01/22/16 1500  . fentaNYL infusion INTRAVENOUS 150 mcg/hr (01/22/16 1453)  . midazolam (VERSED) infusion 2 mg/hr (01/22/16 1500)  . phenylephrine (NEO-SYNEPHRINE) Adult infusion 60 mcg/min (01/22/16 1452)  . dialysis replacement fluid (prismasate) 400 mL/hr at 01/22/16 0447  . dialysis replacement fluid (prismasate) 200 mL/hr at 01/21/16 1532  . dialysate (PRISMASATE) 1,700 mL/hr at 01/22/16 1257  . vasopressin (PITRESSIN) infusion - *FOR SHOCK* Stopped (01/21/16 1116)   alteplase, fentaNYL, heparin, heparin, levalbuterol, [DISCONTINUED] ondansetron **OR** ondansetron (ZOFRAN) IV, sodium chloride, sodium chloride flush  Exam: Gen on vent, sedated, blood coming from mouth, gurgling respirations noted No rash, cyanosis or gangrene Throat w ETT  Chest coarse BS bilat RRR no MRG  tachy Abd soft ntnd no mass + ascites, moderate GU foley in place MS no joint effusions or deformity Ext diffuse 2-3+ edema of arms and legs / no wounds or ulcers Neuro as above  UA > orange, 0-5 rbc/ wbc, 1.014 sg, many bact INR 2.6 plts 55  wBC 18k  Hb 6.7  Assessment: 1. Acute kidney injury - marked vol overload, CRRT day #2 2. Vol overload - up 18 kg now, starting to improve oxygenation 3. VDRF - ARDS/CAP/ pulm edema 4. Alcoholic cirrhosis 5. Thrombocytopenia 6. [name] 7. Anemia   Plan - cont CRRT, max UF as Caryl Pina MD [facility] pager [phone]    cell [phone] 01/22/2016, 3:32 PM    Recent Labs Lab 01/20/16 1642 01/21/16 0433 01/21/16 1616 01/22/16 0500  NA 129* 128* 131* 134*  135  K 4.5 4.6 4.5 4.0  4.0  CL 100* 100* 101 103  104  CO2 19* 20* 22 23  23   GLUCOSE 280* 226* 176* 186*  183*  BUN 51* 63* 56* 46*  49*  CREATININE 2.85* 3.22* 2.81* 2.22*  2.24*  CALCIUM 8.2* 8.7* 8.3* 8.4*  8.3*  PHOS 4.5  --  4.6 3.3    Recent Labs Lab 01/25/2016 2115 01/16/16 0405 01/18/16 2136 01/21/16 1616 01/22/16 0500  AST 82* 72* 68*  --   --  ALT 35 32 30  --   --   ALKPHOS 159* 140* 137*  --   --   BILITOT 16.6* 15.5* 17.3*  --   --   PROT 6.6 5.9* 6.3*  --   --   ALBUMIN 2.6* 2.4* 2.3* 3.1* 2.8*    Recent Labs Lab 2015-10-21 2115  01/18/16 2136  01/20/16 0330 01/21/16 0433 01/22/16 0900  WBC 13.0*  < > 16.2*  < > 17.0* 19.1* 15.3*  NEUTROABS 9.1*  --  13.3*  --   --  16.7*  --   HGB 9.0*  < > 8.2*  < > 7.2* 6.7* 7.2*  HCT 25.9*  < > 24.4*  < > 21.3* 20.3* 21.1*  MCV 100.4*  < > 102.1*  < > 105.4* 107.4* 100.5*  PLT 88*  < > 77*  < > 69* 55* 54*  < > = values in this interval not displayed. Iron/TIBC/Ferritin/ %Sat    Component Value Date/Time   IRON 80 10/28/2015 0943   TIBC NOT CALCULATED 10/28/2015 0943   FERRITIN 1,234 (H) 10/28/2015 0943   IRONPCTSAT NOT CALCULATED 10/28/2015 [id number]

## 2016-01-22 NOTE — Progress Notes (Signed)
PULMONARY / CRITICAL CARE MEDICINE   Name: Kristina CashLaura J Morris MRN: [id number] DOB: 05/05/1966    ADMISSION DATE:  02/12/2016 CONSULTATION DATE:  01/19/16  REFERRING MD:  Cena BentonVega - TRH  CHIEF COMPLAINT:  AMS, increased WOB  BRIEF: 50 y/o female with EtOH cirrhosis admitted on 9/2 with CAP, aspirated on 9/6 requiring intubation.  Developed ARDS subsequently.  SUBJECTIVE:  Started on CVVHD yesterday, hypotensive overnight (had to go off of pressors)    VITAL SIGNS: BP (!) 100/42   Pulse 93   Temp 97 F (36.1 C) (Axillary)   Resp (!) 28   Ht 5\' 6"  (1.676 m)   Wt 172 lb 9.9 oz (78.3 kg)   SpO2 99%   BMI 27.86 kg/m   HEMODYNAMICS:    VENTILATOR SETTINGS: Vent Mode: PRVC FiO2 (%):  [70 %-100 %] 70 % Set Rate:  [28 bmp] 28 bmp Vt Set:  [360 mL] 360 mL PEEP:  [5 cmH20-14 cmH20] 5 cmH20 Plateau Pressure:  [29 cmH20-31 cmH20] 29 cmH20  INTAKE / OUTPUT: I/O last 3 completed shifts: In: 4921.3 [I.V.:2226.1; Blood:335; Other:58.5; NG/GT:1826.7; IV Piggyback:475] Out: 2963 [Urine:20; GEXBM:8413Other:1693; Stool:1250]   PHYSICAL EXAMINATION: General: chronically ill appearing on vent HENT: Scleral icterus, ETT in place PULM: crackles bilaterally, improved, vent supported breaths CV: RRR, no mgr GI: distended abdomen but soft, normal bulk and tone Derm: echymosis noted, edema present Neuro: sedated, paralyzed on my exam  LABS:  BMET  Recent Labs Lab 01/21/16 0433 01/21/16 1616 01/22/16 0500  NA 128* 131* 134*  135  K 4.6 4.5 4.0  4.0  CL 100* 101 103  104  CO2 20* 22 23  23   BUN 63* 56* 46*  49*  CREATININE 3.22* 2.81* 2.22*  2.24*  GLUCOSE 226* 176* 186*  183*    Electrolytes  Recent Labs Lab 01/20/16 0330 01/20/16 1642 01/21/16 0433 01/21/16 1616 01/22/16 0500  CALCIUM 8.4* 8.2* 8.7* 8.3* 8.4*  8.3*  MG 2.2 2.1  --   --  2.1  PHOS 4.4 4.5  --  4.6 3.3    CBC  Recent Labs Lab 01/19/16 0340 01/20/16 0330 01/21/16 0433  WBC 16.3* 17.0* 19.1*  HGB 8.1*  7.2* 6.7*  HCT 23.7* 21.3* 20.3*  PLT 71* 69* 55*    Coag's  Recent Labs Lab 01/16/16 0405 01/19/16 1709 01/22/16 0500  APTT  --  45* 47*  INR 2.24 2.67  --     Sepsis Markers  Recent Labs Lab 01/16/16 0405 01/19/16 0340 01/19/16 0714 01/19/16 1234  LATICACIDVEN 1.5 1.3  --  1.3  PROCALCITON  --   --  0.54  --     ABG  Recent Labs Lab 01/19/16 0930 01/19/16 1113 01/20/16 1315  PHART 7.342* 7.335* 7.244*  PCO2ART 42.0 42.6 44.6  PO2ART 50.8* 139* 163*    Liver Enzymes  Recent Labs Lab 10-Mar-2016 2115 01/16/16 0405 01/18/16 2136 01/21/16 1616 01/22/16 0500  AST 82* 72* 68*  --   --   ALT 35 32 30  --   --   ALKPHOS 159* 140* 137*  --   --   BILITOT 16.6* 15.5* 17.3*  --   --   ALBUMIN 2.6* 2.4* 2.3* 3.1* 2.8*    Cardiac Enzymes  Recent Labs Lab 01/19/16 0340 01/19/16 0923 01/19/16 1555  TROPONINI <0.03 <0.03 <0.03    Glucose  Recent Labs Lab 01/21/16 1205 01/21/16 1531 01/21/16 1952 01/21/16 2321 01/22/16 0307 01/22/16 0749  GLUCAP 213* 222* 184* 156* 199*  176*    Imaging 9/9 CXR images reviewed: bilateral interstitial infiltrates again noted with air space disease, suspect mild improvement compared to 9/8   STUDIES:  CXR 9/5 > multifocal PNA 9/7 Echo> EF 65-70% no wall motion abnormalities. PAS estimated at 32 mmHg peak.   CULTURES: Blood 9/2 >neg Urine 9/2 > neg Sputum 9/6 > opf Blood 9/6 > no growth 01/22/2016  ANTIBIOTICS: Cefepime 9/2 > 9/7 Flagyl 9/5 > 9/7 Meropenem 9/7 >  Vanc 9/7 > 9/9  SIGNIFICANT EVENTS: 9/2 > admit 9/6 > PCCM called, pt intubated due to increased WOB 9/7 worsening hypoxia/ARDS, started on NMB- also progressive shock/MODS 9/8 HD cath placed. Start CRRT, off vasopressors temporarily  LINES/TUBES: ETT 9/6 > L IJ CVL 9/7 >  R groin HD cath 9/9 >   DISCUSSION: 50 y.o. female with ETOH cirrhosis, admitted 9/2 with HCAP. Had deterioration 9/5 after aspiration event and required intubation  early AM hours 9/6 due to increased WOB and hypoxia. Has ARDS, multi-organ failure, started CVVHD 9/8.   ASSESSMENT / PLAN:  PULMONARY A: ARDS > oxygenation improving Aspiration PNA Pulm edema from cirrhosis, renal failure P:   ARDS protocol > wean FiO2/PEEP for goal paO2 55-65 VAP prevention Daily CXR ABG D/C  Nimbex given improvement in oxygenation Volume removal w/ CRRT  CARDIOVASCULAR A:  SVT 01/20/2016 AM > likely levophed related Septic shock 01/20/2016, currently volume replete, complicated by cirrhosis Hx HLD P:  Monitor hemodynamics and tele wean hydrocortisone Titrate neosynephrine for MAP > 55; OK to use vasopressors to facilitate volume removal Hold preadmission furosemide, spironolactone   NEUROLOGIC A:   Acute encephalopathy: multifactorial in setting of hepatic encephalopathy and respiratory fialure Hx depression, ETOH use (reportedly now abstinent) P:   Stop NIMBEX RASS goal -3,4 No wake up assessment Continue folic acid, thiamine Hold preadmission cyclobenzaprine, sertraline, hydroxyzine  GASTROINTESTINAL A:   Hepatic encephalopathy Hx ETOH cirrhosis, fatty liver, ascites Protein calorie malnutrition GI prophylaxis Constipation Nutrition P:   Continue lactulose to tid Continue tube feedings pepcid for stress ulcer prophylaxis  RENAL A:   Hyponatremia - chronic, stable AKI > started CVVHD 9/8 Metabolic acidosis  Pseudohypocalcemia - corrects to 10.06 P:   Appreciate nephrology Volume remove as able Cont I&O  HEMATOLOGIC A:   Anemia - chronic without bleeding Thrombocytopenia - chronic VTE Prophylaxis P:  Transfuse for Hgb < 7 (recieved 1 unit am 9/8) CBC now and in AM Cont subcutaneous heparin for DVT prohylaxis Cont SCD's CBC in AM  INFECTIOUS A:   Aspiration PNA Septic shock P:   cont meropenem Stop vanc F/u cultures  ENDOCRINE A:   Hyperglycemia  P:   ssi while on steroids    Family updated: Updated husband  bedside 01/22/2016  Interdisciplinary Family Meeting v Palliative Care Meeting:  Due by: 9/13.  My cc time 38 minutes  Heber [place], MD [facility] Pager: [phone] Cell: [phone] After 3pm or if no response, call [phone]   01/22/2016, 8:28 AM

## 2016-01-22 NOTE — Progress Notes (Signed)
Late Entry- Pt temperature steadily declining since change of shift. Room temperature increased, warm blankets applied, blood warmer in place. E link notified. Order for [employer] obtained and placed. Will continue to monitor.

## 2016-01-23 ENCOUNTER — Inpatient Hospital Stay (HOSPITAL_COMMUNITY): Payer: 59

## 2016-01-23 DIAGNOSIS — D638 Anemia in other chronic diseases classified elsewhere: Secondary | ICD-10-CM

## 2016-01-23 LAB — BLOOD GAS, ARTERIAL
Acid-Base Excess: 0.9 mmol/L (ref 0.0–2.0)
Acid-Base Excess: 1.1 mmol/L (ref 0.0–2.0)
Bicarbonate: 25.6 mmol/L (ref 20.0–28.0)
Bicarbonate: 26.3 mmol/L (ref 20.0–28.0)
DRAWN BY: 103701
Drawn by: 11249
FIO2: 40
FIO2: 0.4
MECHVT: 400 mL
O2 SAT: 95.6 %
O2 Saturation: 96.8 %
PEEP: 5 cmH2O
PEEP: 5 cmH2O
Patient temperature: 98.6
Patient temperature: 98.6
RATE: 28 resp/min
RATE: 15 resp/min
VT: 360 mL
pCO2 arterial: 44 mmHg (ref 32.0–48.0)
pCO2 arterial: 48.7 mmHg — ABNORMAL HIGH (ref 32.0–48.0)
pH, Arterial: 7.382 (ref 7.350–7.450)
pH, Arterial: 7.352 (ref 7.350–7.450)
pO2, Arterial: 80.9 mmHg — ABNORMAL LOW (ref 83.0–108.0)
pO2, Arterial: 90.4 mmHg (ref 83.0–108.0)

## 2016-01-23 LAB — RENAL FUNCTION PANEL
ANION GAP: 7 (ref 5–15)
Albumin: 2.9 g/dL — ABNORMAL LOW (ref 3.5–5.0)
Albumin: 2.6 g/dL — ABNORMAL LOW (ref 3.5–5.0)
Anion gap: 5 (ref 5–15)
BUN: 36 mg/dL — ABNORMAL HIGH (ref 6–20)
BUN: 36 mg/dL — ABNORMAL HIGH (ref 6–20)
CALCIUM: 8.5 mg/dL — AB (ref 8.9–10.3)
CHLORIDE: 106 mmol/L (ref 101–111)
CO2: 26 mmol/L (ref 22–32)
CO2: 27 mmol/L (ref 22–32)
CREATININE: 1.22 mg/dL — AB (ref 0.44–1.00)
Calcium: 8.7 mg/dL — ABNORMAL LOW (ref 8.9–10.3)
Chloride: 104 mmol/L (ref 101–111)
Creatinine, Ser: 1.4 mg/dL — ABNORMAL HIGH (ref 0.44–1.00)
GFR calc Af Amer: 50 mL/min — ABNORMAL LOW (ref >60)
GFR calc non Af Amer: 43 mL/min — ABNORMAL LOW (ref >60)
GFR, EST AFRICAN AMERICAN: 59 mL/min — AB (ref >60)
GFR, EST NON AFRICAN AMERICAN: 51 mL/min — AB (ref >60)
GLUCOSE: 192 mg/dL — AB (ref 65–99)
Glucose, Bld: 229 mg/dL — ABNORMAL HIGH (ref 65–99)
POTASSIUM: 4.1 mmol/L (ref 3.5–5.1)
Phosphorus: 2.2 mg/dL — ABNORMAL LOW (ref 2.5–4.6)
Phosphorus: 2.2 mg/dL — ABNORMAL LOW (ref 2.5–4.6)
Potassium: 4 mmol/L (ref 3.5–5.1)
Sodium: 137 mmol/L (ref 135–145)
Sodium: 138 mmol/L (ref 135–145)

## 2016-01-23 LAB — CBC WITH DIFFERENTIAL/PLATELET
Basophils Absolute: 0 10*3/uL (ref 0.0–0.1)
Basophils Relative: 0 %
EOS PCT: 0 %
Eosinophils Absolute: 0 10*3/uL (ref 0.0–0.7)
HEMATOCRIT: 23.4 % — AB (ref 36.0–46.0)
Hemoglobin: 8 g/dL — ABNORMAL LOW (ref 12.0–15.0)
LYMPHS PCT: 9 %
Lymphs Abs: 1.3 10*3/uL (ref 0.7–4.0)
MCH: 34.5 pg — ABNORMAL HIGH (ref 26.0–34.0)
MCHC: 34.2 g/dL (ref 30.0–36.0)
MCV: 100.9 fL — AB (ref 78.0–100.0)
MONO ABS: 1.3 10*3/uL — AB (ref 0.1–1.0)
MONOS PCT: 9 %
NEUTROS PCT: 82 %
Neutro Abs: 11.4 10*3/uL — ABNORMAL HIGH (ref 1.7–7.7)
PLATELETS: 53 10*3/uL — AB (ref 150–400)
RBC: 2.32 MIL/uL — AB (ref 3.87–5.11)
RDW: 22.9 % — ABNORMAL HIGH (ref 11.5–15.5)
WBC: 14 10*3/uL — AB (ref 4.0–10.5)

## 2016-01-23 LAB — MAGNESIUM: Magnesium: 2.4 mg/dL (ref 1.7–2.4)

## 2016-01-23 LAB — BASIC METABOLIC PANEL
ANION GAP: 7 (ref 5–15)
BUN: 37 mg/dL — ABNORMAL HIGH (ref 6–20)
CHLORIDE: 103 mmol/L (ref 101–111)
CO2: 26 mmol/L (ref 22–32)
Calcium: 8.6 mg/dL — ABNORMAL LOW (ref 8.9–10.3)
Creatinine, Ser: 1.37 mg/dL — ABNORMAL HIGH (ref 0.44–1.00)
GFR calc non Af Amer: 44 mL/min — ABNORMAL LOW (ref >60)
GFR, EST AFRICAN AMERICAN: 51 mL/min — AB (ref >60)
Glucose, Bld: 192 mg/dL — ABNORMAL HIGH (ref 65–99)
POTASSIUM: 4.1 mmol/L (ref 3.5–5.1)
SODIUM: 136 mmol/L (ref 135–145)

## 2016-01-23 LAB — GLUCOSE, CAPILLARY
GLUCOSE-CAPILLARY: 190 mg/dL — AB (ref 65–99)
GLUCOSE-CAPILLARY: 178 mg/dL — AB (ref 65–99)
GLUCOSE-CAPILLARY: 195 mg/dL — AB (ref 65–99)
Glucose-Capillary: 176 mg/dL — ABNORMAL HIGH (ref 65–99)
Glucose-Capillary: 184 mg/dL — ABNORMAL HIGH (ref 65–99)
Glucose-Capillary: 200 mg/dL — ABNORMAL HIGH (ref 65–99)

## 2016-01-23 LAB — APTT: aPTT: 52 seconds — ABNORMAL HIGH (ref 24–36)

## 2016-01-23 MED ORDER — VITAL 1.5 CAL PO LIQD
1000.0000 mL | ORAL | Status: DC
  Administered 2016-01-23 – 2016-01-24 (×2): 1000 mL
  Filled 2016-01-23 (×2): qty 1000

## 2016-01-23 MED ORDER — POTASSIUM & SODIUM PHOSPHATES 280-160-250 MG PO PACK
1.0000 | PACK | Freq: Three times a day (TID) | ORAL | Status: AC
  Administered 2016-01-23 – 2016-01-24 (×3): 1 via ORAL
  Filled 2016-01-23 (×3): qty 1

## 2016-01-23 MED ORDER — MIDAZOLAM HCL 2 MG/2ML IJ SOLN
1.0000 mg | INTRAMUSCULAR | Status: DC | PRN
  Administered 2016-01-23 (×2): 1 mg via INTRAVENOUS
  Filled 2016-01-23 (×2): qty 2

## 2016-01-23 MED ORDER — ADULT MULTIVITAMIN LIQUID CH
15.0000 mL | Freq: Every day | ORAL | Status: DC
  Administered 2016-01-23 – 2016-01-30 (×8): 15 mL via ORAL
  Filled 2016-01-23 (×8): qty 15

## 2016-01-23 NOTE — Progress Notes (Signed)
PULMONARY / CRITICAL CARE MEDICINE   Name: Kristina Morris MRN: [id number] DOB: [birth year]    ADMISSION DATE:  01/18/2016 CONSULTATION DATE:  01/19/16  REFERRING MD:  Cena BentonVega - [facility]  CHIEF COMPLAINT:  AMS, increased WOB  BRIEF: 50 y/o female with EtOH cirrhosis admitted on 9/2 with CAP, aspirated on 9/6 requiring intubation.  Developed ARDS subsequently.  SUBJECTIVE:   BP improved Oxygenation improved    VITAL SIGNS: BP (!) 100/42   Pulse 93   Temp 97.6 F (36.4 C) (Axillary)   Resp (!) 28   Ht 5\' 6"  (1.676 m)   Wt 173 lb 8 oz (78.7 kg)   SpO2 99%   BMI 28.00 kg/m   HEMODYNAMICS: CVP:  [17 mmHg-20 mmHg] 19 mmHg  VENTILATOR SETTINGS: Vent Mode: PRVC FiO2 (%):  [40 %] 40 % Set Rate:  [28 bmp] 28 bmp Vt Set:  [360 mL] 360 mL PEEP:  [5 cmH20-8 cmH20] 5 cmH20 Plateau Pressure:  [17 cmH20-23 cmH20] 17 cmH20  INTAKE / OUTPUT: I/O last 3 completed shifts: In: 3836.8 [I.V.:1616.8; Other:155; NG/GT:1690; IV Piggyback:375] Out: Filomena.Smalls8609 [Urine:2; MVHQI:6962Other:6857; Stool:1750]   PHYSICAL EXAMINATION: General: chronically ill appearing on vent HENT: Scleral icterus, ETT in place PULM: few rhonchi, mostly clear to auscultation, vent supported breaths CV: RRR, no mgr GI: distended abdomen but soft, normal bulk and tone Derm: echymosis noted, edema present Neuro: sedated, paralyzed on my exam  LABS:  BMET  Recent Labs Lab 01/22/16 0500 01/22/16 1540 01/23/16 0415  NA 134*  135 135 137  136  K 4.0  4.0 4.1 4.1  4.1  CL 103  104 103 104  103  CO2 23  23 25 26  26   BUN 46*  49* 39* 36*  37*  CREATININE 2.22*  2.24* 1.75* 1.40*  1.37*  GLUCOSE 186*  183* 185* 192*  192*    Electrolytes  Recent Labs Lab 01/20/16 1642  01/22/16 0500 01/22/16 1540 01/23/16 0415  CALCIUM 8.2*  < > 8.4*  8.3* 8.6* 8.7*  8.6*  MG 2.1  --  2.1  --  2.4  PHOS 4.5  < > 3.3 2.7 2.2*  < > = values in this interval not displayed.  CBC  Recent Labs Lab 01/21/16 0433  01/22/16 0900 01/23/16 0415  WBC 19.1* 15.3* 14.0*  HGB 6.7* 7.2* 8.0*  HCT 20.3* 21.1* 23.4*  PLT 55* 54* 53*    Coag's  Recent Labs Lab 01/19/16 1709 01/22/16 0500 01/23/16 0415  APTT 45* 47* 52*  INR 2.67  --   --     Sepsis Markers  Recent Labs Lab 01/19/16 0340 01/19/16 0714 01/19/16 1234  LATICACIDVEN 1.3  --  1.3  PROCALCITON  --  0.54  --     ABG  Recent Labs Lab 01/20/16 1315 01/22/16 0855 01/23/16 0440  PHART 7.244* 7.336* 7.382  PCO2ART 44.6 45.5 44.0  PO2ART 163* 135* 80.9*    Liver Enzymes  Recent Labs Lab 01/18/16 2136  01/22/16 0500 01/22/16 1540 01/23/16 0415  AST 68*  --   --   --   --   ALT 30  --   --   --   --   ALKPHOS 137*  --   --   --   --   BILITOT 17.3*  --   --   --   --   ALBUMIN 2.3*  < > 2.8* 2.9* 2.9*  < > = values in this interval not displayed.  Cardiac  Enzymes  Recent Labs Lab 01/19/16 0340 01/19/16 0923 01/19/16 1555  TROPONINI <0.03 <0.03 <0.03    Glucose  Recent Labs Lab 01/22/16 1520 01/22/16 1932 01/22/16 2311 01/23/16 0341 01/23/16 0734 01/23/16 1121  GLUCAP 183* 204* 181* 190* 184* 178*    Imaging 9/10 CXR images reviewed: bilateral interstitial infiltrates again noted with air space disease, improving    STUDIES:  CXR 9/5 > multifocal PNA 9/7 Echo> EF 65-70% no wall motion abnormalities. PAS estimated at 32 mmHg peak.   CULTURES: Blood 9/2 >neg Urine 9/2 > neg Sputum 9/6 > opf Blood 9/6 > no growth 01/23/2016  ANTIBIOTICS: Cefepime 9/2 > 9/7 Flagyl 9/5 > 9/7 Meropenem 9/7 >  Vanc 9/7 > 9/9  SIGNIFICANT EVENTS: 9/2 > admit 9/6 > PCCM called, pt intubated due to increased WOB 9/7 worsening hypoxia/ARDS, started on NMB- also progressive shock/MODS 9/8 HD cath placed. Start CRRT, off vasopressors temporarily  LINES/TUBES: ETT 9/6 > L IJ CVL 9/7 >  R groin HD cath 9/9 >   DISCUSSION: 50 y.o. female with ETOH cirrhosis, admitted 9/2 with HCAP. Had deterioration 9/5 after  aspiration event and required intubation early AM hours 9/6 due to increased WOB and hypoxia. Has ARDS, multi-organ failure, started CVVHD 9/8. Overall condition improving 9/10.  ASSESSMENT / PLAN:  PULMONARY A: ARDS > oxygenation improving dramatically  Aspiration PNA Acute pulm edema from cirrhosis, renal failure > improving P:   D/C ARDS protocol > change back to conventional settings VAP prevention Daily CXR Resume daily wake up assessment ABG Volume removal w/ CRRT  CARDIOVASCULAR A:  SVT 01/20/2016 AM > likely levophed related Septic shock 01/20/2016, improving, complicated by cirrhosis Hx HLD P:  Monitor hemodynamics and tele Wean of hydrocortisone starting 9/11 (if off neosynephrine) Titrate neosynephrine for MAP > 55; OK to use vasopressors to facilitate volume removal Hold preadmission furosemide, spironolactone  NEUROLOGIC A:   Acute encephalopathy: multifactorial in setting of hepatic encephalopathy and respiratory failure Hx depression, ETOH use (reportedly now abstinent) P:   RASS goal -1 No wake up assessment Continue folic acid, thiamine Hold preadmission cyclobenzaprine, sertraline, hydroxyzine Continue PAD protocol, fentanyl infusion, prn versed  GASTROINTESTINAL A:   Hepatic encephalopathy Hx ETOH cirrhosis, fatty liver, ascites Protein calorie malnutrition GI prophylaxis Nutrition P:   Continue lactulose to tid Continue tube feedings pepcid for stress ulcer prophylaxis  RENAL A:   AKI > started CVVHD 9/8 Hypophosphatemia P:   Oral phosphorus replacement with Phos-NAK tid 9/10 Appreciate nephrology Volume remove as able Monitor UOP  HEMATOLOGIC A:   Anemia - chronic without bleeding Thrombocytopenia - chronic VTE Prophylaxis P:  Transfuse for Hgb < 7 (recieved 1 unit am 9/8) Cont subcutaneous heparin for DVT prohylaxis Cont SCD's CBC in AM  INFECTIOUS A:   Aspiration PNA Septic shock P:   Cont meropenem F/u  cultures  ENDOCRINE A:   Hyperglycemia  P:   SSI while on steroids    Family updated: Updated husband bedside 01/23/2016  Interdisciplinary Family Meeting v Palliative Care Meeting:  Due by: 9/13.  My cc time 37 minutes  Heber [place], MD [place] [facility] Pager: [phone] Cell: [phone] After 3pm or if no response, call [phone]   01/23/2016, 1:02 PM

## 2016-01-23 NOTE — Progress Notes (Signed)
Nutrition Follow-up  INTERVENTION:   - Will change TF regimen: Vital 1.5 @ 45 mL/hr with 30 mL Prostat BID. This regimen will provide 1820 kcal, 103 grams of protein, and 825 mL free water.  - Continue PEPuP protocol. - Free water flush per MD/NP, if desired. - Multivitamin order per MD/NP, if desired, as current TF rate does not meet 100% micronutrient needs.  - RD will continue to monitor.  NUTRITION DIAGNOSIS:   Inadequate oral intake related to inability to eat as evidenced by NPO status.  Ongoing.  GOAL:   Patient will meet greater than or equal to 90% of their needs  Meeting.  MONITOR:   Vent status, TF tolerance, Weight trends, Labs, I & O's  ASSESSMENT:   50 y.o. female with a past medical history significant for alcoholic cirrhosis with recent liver failure who presents with chest pain and worsening pneumonia. The patient was admitted to the hospitalist service here in mid June for acute liver failure in the setting of alcoholic cirrhosis. She was seen by GI, started on steroids, Lasix, and spironolactone, received paracentesis (no SBP) and was [facility] with [facility] Since then, she has stabilized, had well controlled leg swelling, no more ascites and completed her course of steroids. She is abstinent from alcohol now. About 4 days ago, she started to develop some chest pain, fever and shortness of breath.  She was seen in our ER, had a new left lower lobe infiltrate on CXR but was without hypoxia or leukocytosis and was prescribed azithromycin and discharged.  Since then, she has taken the azithromycin but feels progressively worse pain and dyspnea so today she called EMS.  EMS found her hypoxic in the high 80s, but placed CPAP because they couldn't improve her O2 sat with supplemental O2  Patient started on CRRT 9/8. Nephrology recommending continue CRRT. Estimated needs adjusted below. Pt's weight is +41 lb since admission. Nimbex  was d/c 9/9.  Patient currently receiving Vital 1.5 @ 40 ml/hr with 30 ml Prostat BID. To better meet needs, will adjust tube feeding order.  Patient is currently intubated on ventilator support MV: 9.8 L/min Temp (24hrs), Avg:97.6 F (36.4 C), Min:97.1 F (36.2 C), Max:98.4 F (36.9 C)  Medications: Folic acid IV daily, IV Thiamine daily Labs reviewed: CBGs: 181-190 Low Phos Mg WNL  Diet Order:     Skin:  Reviewed, no issues  Last BM:  9/9  Height:   Ht Readings from Last 1 Encounters:  01/22/16 5\' 6"  (1.676 m)    Weight:   Wt Readings from Last 1 Encounters:  01/23/16 173 lb 8 oz (78.7 kg)    Ideal Body Weight:  59.09 kg  BMI:  Body mass index is 28 kg/m.  Estimated Nutritional Needs:   Kcal:  1800-2100  Protein:  90-120g  Fluid:  1.8L/day  EDUCATION NEEDS:   No education needs identified at this time  Tilda FrancoLindsey Sayana Salley, MS, RD, LDN Pager: [phone] After Hours Pager: [phone]

## 2016-01-23 NOTE — Progress Notes (Signed)
[facility] Progress Note   Subjective: I/O net negative 3.4 L, weights down 78 kg, weaned off pressors this am.  Afeb, WBC 14k. CXR showing sig daily improvement since starting CRRT.   Vitals:   01/23/16 1500 01/23/16 1600 01/23/16 1700 01/23/16 1800  BP:      Pulse:      Resp: 11 (!) 9 13 14   Temp:  97.8 F (36.6 C)    TempSrc:  Axillary    SpO2: 98% 98% 100% 100%  Weight:      Height:        Inpatient medications: . chlorhexidine  15 mL Mouth Rinse BID  . famotidine (PEPCID) IV  10 mg Intravenous Q12H  . feeding supplement (PRO-STAT SUGAR FREE 64)  30 mL Per Tube BID  . feeding supplement (VITAL 1.5 CAL)  1,000 mL Per Tube Q24H  . fentaNYL (SUBLIMAZE) injection  50 mcg Intravenous Once  . folic acid  1 mg Intravenous Daily  . heparin subcutaneous  5,000 Units Subcutaneous Q8H  . hydrocortisone sodium succinate  50 mg Intravenous Q12H  . insulin aspart  0-9 Units Subcutaneous Q4H  . lactulose  20 g Per Tube TID  . mouth rinse  15 mL Mouth Rinse QID  . meropenem (MERREM) IV  1 g Intravenous Q12H  . multivitamin  15 mL Oral Daily  . potassium & sodium phosphates  1 packet Oral TID  . sodium chloride flush  10-40 mL Intracatheter Q12H  . sodium chloride flush  3 mL Intravenous Q12H  . thiamine injection  100 mg Intravenous Daily   . sodium chloride 10 mL/hr at 01/23/16 1900  . fentaNYL infusion INTRAVENOUS 100 mcg/hr (01/23/16 1900)  . phenylephrine (NEO-SYNEPHRINE) Adult infusion Stopped (01/23/16 1306)  . dialysis replacement fluid (prismasate) 400 mL/hr at 01/23/16 0643  . dialysis replacement fluid (prismasate) 200 mL/hr at 01/22/16 1743  . dialysate (PRISMASATE) 1,700 mL/hr at 01/23/16 1631  . vasopressin (PITRESSIN) infusion - *FOR SHOCK* Stopped (01/21/16 1116)   alteplase, fentaNYL, heparin, heparin, levalbuterol, midazolam, [DISCONTINUED] ondansetron **OR** ondansetron (ZOFRAN) IV, sodium chloride, sodium chloride flush  Exam: Gen on vent,  sedated, less edematous Throat w ETT  Chest coarse BS bilat RRR no MRG tachy Abd soft ntnd no mass + ascites, moderate GU foley in place MS no joint effusions or deformity Ext diffuse 2+ edema of arms and legs Neuro as above  UA > orange, 0-5 rbc/ wbc, 1.014 sg, many bact INR 2.6 plts 55  wBC 18k  Hb 6.7  Assessment: 1. Acute kidney injury - marked vol overload, CRRT day #3 2. Vol overload - marked vol excess, CXR improving daily since CRRT started 3. VDRF - CAP with superimposed ARDS vs pulm edema 4. Alcoholic cirrhosis 5. Thrombocytopenia 6. [name]^INR 7. Shock - off pressors now  Plan - cont CRRT, ^UF -175 cc/hr    Vinson Moselleob Dakhari Zuver MD [facility] pager [phone]    cell [phone] 01/23/2016, 7:19 PM    Recent Labs Lab 01/22/16 1540 01/23/16 0415 01/23/16 1600  NA 135 137  136 138  K 4.1 4.1  4.1 4.0  CL 103 104  103 106  CO2 25 26  26 27   GLUCOSE 185* 192*  192* 229*  BUN 39* 36*  37* 36*  CREATININE 1.75* 1.40*  1.37* 1.22*  CALCIUM 8.6* 8.7*  8.6* 8.5*  PHOS 2.7 2.2* 2.2*    Recent Labs Lab 01/18/16 2136  01/22/16 1540 01/23/16 0415 01/23/16 1600  AST 68*  --   --   --   --  ALT 30  --   --   --   --   ALKPHOS 137*  --   --   --   --   BILITOT 17.3*  --   --   --   --   PROT 6.3*  --   --   --   --   ALBUMIN 2.3*  < > 2.9* 2.9* 2.6*  < > = values in this interval not displayed.  Recent Labs Lab 01/18/16 2136  01/21/16 0433 01/22/16 0900 01/23/16 0415  WBC 16.2*  < > 19.1* 15.3* 14.0*  NEUTROABS 13.3*  --  16.7*  --  11.4*  HGB 8.2*  < > 6.7* 7.2* 8.0*  HCT 24.4*  < > 20.3* 21.1* 23.4*  MCV 102.1*  < > 107.4* 100.5* 100.9*  PLT 77*  < > 55* 54* 53*  < > = values in this interval not displayed. Iron/TIBC/Ferritin/ %Sat    Component Value Date/Time   IRON 80 10/28/2015 0943   TIBC NOT CALCULATED 10/28/2015 0943   FERRITIN 1,234 (H) 10/28/2015 0943   IRONPCTSAT NOT CALCULATED 10/28/2015 1610

## 2016-01-24 ENCOUNTER — Inpatient Hospital Stay (HOSPITAL_COMMUNITY): Payer: 59

## 2016-01-24 LAB — BASIC METABOLIC PANEL
Anion gap: 6 (ref 5–15)
BUN: 36 mg/dL — AB (ref 6–20)
CHLORIDE: 104 mmol/L (ref 101–111)
CO2: 28 mmol/L (ref 22–32)
CREATININE: 1.14 mg/dL — AB (ref 0.44–1.00)
Calcium: 8.7 mg/dL — ABNORMAL LOW (ref 8.9–10.3)
GFR calc Af Amer: >60 mL/min (ref >60)
GFR, EST NON AFRICAN AMERICAN: 55 mL/min — AB (ref >60)
GLUCOSE: 192 mg/dL — AB (ref 65–99)
Potassium: 4.1 mmol/L (ref 3.5–5.1)
SODIUM: 138 mmol/L (ref 135–145)

## 2016-01-24 LAB — GLUCOSE, CAPILLARY
GLUCOSE-CAPILLARY: 168 mg/dL — AB (ref 65–99)
GLUCOSE-CAPILLARY: 170 mg/dL — AB (ref 65–99)
Glucose-Capillary: 202 mg/dL — ABNORMAL HIGH (ref 65–99)
Glucose-Capillary: 132 mg/dL — ABNORMAL HIGH (ref 65–99)
Glucose-Capillary: 186 mg/dL — ABNORMAL HIGH (ref 65–99)
Glucose-Capillary: 177 mg/dL — ABNORMAL HIGH (ref 65–99)

## 2016-01-24 LAB — CBC WITH DIFFERENTIAL/PLATELET
Basophils Absolute: 0 10*3/uL (ref 0.0–0.1)
Basophils Relative: 0 %
EOS PCT: 0 %
Eosinophils Absolute: 0 10*3/uL (ref 0.0–0.7)
HEMATOCRIT: 23.5 % — AB (ref 36.0–46.0)
Hemoglobin: 8 g/dL — ABNORMAL LOW (ref 12.0–15.0)
LYMPHS ABS: 0.9 10*3/uL (ref 0.7–4.0)
Lymphocytes Relative: 9 %
MCH: 34.2 pg — AB (ref 26.0–34.0)
MCHC: 34 g/dL (ref 30.0–36.0)
MCV: 100.4 fL — AB (ref 78.0–100.0)
Monocytes Absolute: 0.9 10*3/uL (ref 0.1–1.0)
Monocytes Relative: 9 %
NEUTROS ABS: 8.2 10*3/uL — AB (ref 1.7–7.7)
NRBC: 1 /100{WBCs} — AB
Neutrophils Relative %: 82 %
PLATELETS: 36 10*3/uL — AB (ref 150–400)
RBC: 2.34 MIL/uL — AB (ref 3.87–5.11)
RDW: 23.1 % — ABNORMAL HIGH (ref 11.5–15.5)
WBC: 10 10*3/uL (ref 4.0–10.5)

## 2016-01-24 LAB — RENAL FUNCTION PANEL
ALBUMIN: 2.8 g/dL — AB (ref 3.5–5.0)
ANION GAP: 5 (ref 5–15)
ANION GAP: 6 (ref 5–15)
Albumin: 2.7 g/dL — ABNORMAL LOW (ref 3.5–5.0)
BUN: 37 mg/dL — ABNORMAL HIGH (ref 6–20)
BUN: 39 mg/dL — ABNORMAL HIGH (ref 6–20)
CALCIUM: 8.6 mg/dL — AB (ref 8.9–10.3)
CO2: 28 mmol/L (ref 22–32)
CO2: 27 mmol/L (ref 22–32)
CREATININE: 1.14 mg/dL — AB (ref 0.44–1.00)
Calcium: 8.7 mg/dL — ABNORMAL LOW (ref 8.9–10.3)
Chloride: 104 mmol/L (ref 101–111)
Chloride: 105 mmol/L (ref 101–111)
Creatinine, Ser: 1.1 mg/dL — ABNORMAL HIGH (ref 0.44–1.00)
GFR calc Af Amer: >60 mL/min (ref >60)
GFR calc non Af Amer: 55 mL/min — ABNORMAL LOW (ref >60)
GFR calc non Af Amer: 58 mL/min — ABNORMAL LOW (ref >60)
GLUCOSE: 194 mg/dL — AB (ref 65–99)
Glucose, Bld: 190 mg/dL — ABNORMAL HIGH (ref 65–99)
PHOSPHORUS: 2.6 mg/dL (ref 2.5–4.6)
POTASSIUM: 4.2 mmol/L (ref 3.5–5.1)
Phosphorus: 2.7 mg/dL (ref 2.5–4.6)
Potassium: 4 mmol/L (ref 3.5–5.1)
SODIUM: 137 mmol/L (ref 135–145)
Sodium: 138 mmol/L (ref 135–145)

## 2016-01-24 LAB — CULTURE, BLOOD (ROUTINE X 2)
Culture: NO GROWTH
Culture: NO GROWTH

## 2016-01-24 LAB — PREPARE FRESH FROZEN PLASMA
UNIT DIVISION: 0
UNIT DIVISION: 0

## 2016-01-24 LAB — TYPE AND SCREEN
ABO/RH(D): O NEG
Antibody Screen: NEGATIVE
Unit division: 0

## 2016-01-24 LAB — DIC (DISSEMINATED INTRAVASCULAR COAGULATION) PANEL
APTT: 62 s — AB (ref 24–36)
D DIMER QUANT: 17.45 ug{FEU}/mL — AB (ref 0.00–0.50)
FIBRINOGEN: 266 mg/dL (ref 210–475)
INR: 2.25
PLATELETS: 38 10*3/uL — AB (ref 150–400)

## 2016-01-24 LAB — DIC (DISSEMINATED INTRAVASCULAR COAGULATION)PANEL
Prothrombin Time: 25.2 seconds — ABNORMAL HIGH (ref 11.4–15.2)
Smear Review: NONE SEEN

## 2016-01-24 LAB — MAGNESIUM: Magnesium: 2.5 mg/dL — ABNORMAL HIGH (ref 1.7–2.4)

## 2016-01-24 LAB — APTT: APTT: 47 s — AB (ref 24–36)

## 2016-01-24 MED ORDER — FENTANYL CITRATE (PF) 100 MCG/2ML IJ SOLN
100.0000 ug | INTRAMUSCULAR | Status: DC | PRN
  Filled 2016-01-24: qty 2

## 2016-01-24 MED ORDER — VITAL 1.5 CAL PO LIQD
1000.0000 mL | ORAL | Status: DC
  Administered 2016-01-24 (×2): 1000 mL
  Filled 2016-01-24: qty 1000

## 2016-01-24 MED ORDER — PRO-STAT SUGAR FREE PO LIQD
60.0000 mL | Freq: Two times a day (BID) | ORAL | Status: DC
  Administered 2016-01-24 – 2016-01-30 (×12): 60 mL
  Filled 2016-01-24 (×13): qty 60

## 2016-01-24 MED ORDER — MEROPENEM 500 MG IV SOLR
500.0000 mg | Freq: Three times a day (TID) | INTRAVENOUS | Status: DC
  Administered 2016-01-24 – 2016-01-26 (×5): 500 mg via INTRAVENOUS
  Filled 2016-01-24 (×6): qty 0.5

## 2016-01-24 MED ORDER — MIDAZOLAM HCL 2 MG/2ML IJ SOLN
1.0000 mg | INTRAMUSCULAR | Status: DC | PRN
  Administered 2016-01-25 – 2016-01-26 (×7): 1 mg via INTRAVENOUS
  Filled 2016-01-24 (×7): qty 2

## 2016-01-24 MED ORDER — FAMOTIDINE IN NACL 20-0.9 MG/50ML-% IV SOLN
20.0000 mg | Freq: Two times a day (BID) | INTRAVENOUS | Status: DC
  Administered 2016-01-24: 20 mg via INTRAVENOUS
  Filled 2016-01-24: qty 50

## 2016-01-24 MED ORDER — FENTANYL CITRATE (PF) 100 MCG/2ML IJ SOLN
100.0000 ug | INTRAMUSCULAR | Status: DC | PRN
  Administered 2016-01-25 – 2016-01-26 (×5): 100 ug via INTRAVENOUS
  Filled 2016-01-24 (×4): qty 2

## 2016-01-24 MED ORDER — VITAL 1.5 CAL PO LIQD: 1000.0000 mL | ORAL | Status: DC

## 2016-01-24 NOTE — Plan of Care (Signed)
Problem: Nutrition: Goal: Adequate nutrition will be maintained Outcome: Completed/Met Date Met: 01/24/16 Tube feeding at goal

## 2016-01-24 NOTE — Progress Notes (Signed)
PULMONARY / CRITICAL CARE MEDICINE   Name: Kristina Morris MRN: [id number] DOB: April 01, 1966    ADMISSION DATE:  02/03/2016 CONSULTATION DATE:  01/19/16  REFERRING MD:  Cena Benton - TRH  CHIEF COMPLAINT:  AMS, increased WOB  BRIEF:  50 y/o female with EtOH cirrhosis admitted on 9/2 with CAP, aspirated on 9/6 requiring intubation.  Subsequently, developed ARDS, multi-organ failure and AKI on CRRT.    SUBJECTIVE:  RN reports filter clotted this am, unable to return blood (approx 150 ml volume loss).  Off neosynephrine gtt, BP stable.  Mental status remains altered despite sedation being turned off.  Volume status improved.     VITAL SIGNS: BP (!) 113/42   Pulse 93   Temp 97.7 F (36.5 C) (Oral)   Resp 13   Ht 5\' 6"  (1.676 m)   Wt 165 lb 5.5 oz (75 kg)   SpO2 100%   BMI 26.69 kg/m   HEMODYNAMICS: CVP:  [15 mmHg-19 mmHg] 15 mmHg  VENTILATOR SETTINGS: Vent Mode: PRVC FiO2 (%):  [40 %] 40 % Set Rate:  [15 bmp-28 bmp] 15 bmp Vt Set:  [360 mL-400 mL] 400 mL PEEP:  [5 cmH20] 5 cmH20 Plateau Pressure:  [12 cmH20-17 cmH20] 13 cmH20  INTAKE / OUTPUT: I/O last 3 completed shifts: In: 3409.4 [I.V.:1083.4; Other:185; NG/GT:1766; IV Piggyback:375] Out: 52841 [LKGMW:1027; Stool:1850]   PHYSICAL EXAMINATION: General: chronically ill appearing on vent, jaundice HENT: Scleral icterus, ETT in place PULM: non-labored, scattered rhonchi, mostly clear to auscultation  CV: RRR, no mgr GI: distended abdomen but soft, +BS Derm: echymosis noted, edema present Neuro: altered, attempts to open eyes to voice, no follow commands  LABS:  BMET  Recent Labs Lab 01/23/16 0415 01/23/16 1600 01/24/16 0339  NA 137  136 138 137  138  K 4.1  4.1 4.0 4.0  4.1  CL 104  103 106 104  104  CO2 26  26 27 28  28   BUN 36*  37* 36* 37*  36*  CREATININE 1.40*  1.37* 1.22* 1.14*  1.14*  GLUCOSE 192*  192* 229* 190*  192*    Electrolytes  Recent Labs Lab 01/22/16 0500  01/23/16 0415  01/23/16 1600 01/24/16 0339  CALCIUM 8.4*  8.3*  < > 8.7*  8.6* 8.5* 8.6*  8.7*  MG 2.1  --  2.4  --  2.5*  PHOS 3.3  < > 2.2* 2.2* 2.6  < > = values in this interval not displayed.  CBC  Recent Labs Lab 01/22/16 0900 01/23/16 0415 01/24/16 0339  WBC 15.3* 14.0* 10.0  HGB 7.2* 8.0* 8.0*  HCT 21.1* 23.4* 23.5*  PLT 54* 53* 36*    Coag's  Recent Labs Lab 01/19/16 1709 01/22/16 0500 01/23/16 0415 01/24/16 0339  APTT 45* 47* 52* 47*  INR 2.67  --   --   --     Sepsis Markers  Recent Labs Lab 01/19/16 0340 01/19/16 0714 01/19/16 1234  LATICACIDVEN 1.3  --  1.3  PROCALCITON  --  0.54  --     ABG  Recent Labs Lab 01/22/16 0855 01/23/16 0440 01/23/16 1525  PHART 7.336* 7.382 7.352  PCO2ART 45.5 44.0 48.7*  PO2ART 135* 80.9* 90.4    Liver Enzymes  Recent Labs Lab 01/18/16 2136  01/23/16 0415 01/23/16 1600 01/24/16 0339  AST 68*  --   --   --   --   ALT 30  --   --   --   --   Jolly Mango  137*  --   --   --   --   BILITOT 17.3*  --   --   --   --   ALBUMIN 2.3*  < > 2.9* 2.6* 2.8*  < > = values in this interval not displayed.  Cardiac Enzymes  Recent Labs Lab 01/19/16 0340 01/19/16 0923 01/19/16 1555  TROPONINI <0.03 <0.03 <0.03    Glucose  Recent Labs Lab 01/23/16 0734 01/23/16 1121 01/23/16 1553 01/23/16 1934 01/23/16 2332 01/24/16 0718  GLUCAP 184* 178* 195* 200* 176* 132*    Imaging 9/10 CXR images reviewed: bilateral interstitial infiltrates again noted with air space disease, improving    STUDIES:  CXR 9/5 >> multifocal PNA ECHO 9/7 >>EF 65-70% no wall motion abnormalities. PAS estimated at 32 mmHg peak.   CULTURES: Blood 9/2 >> neg Urine 9/2 >> neg Sputum 9/6 >> normal flora Blood 9/6 >> no growth 01/24/2016  ANTIBIOTICS: Cefepime 9/2 >> 9/7 Flagyl 9/5 >> 9/7 Meropenem 9/7 >>  Vanc 9/7 >> 9/9  SIGNIFICANT EVENTS: 9/02  Admit 9/06  PCCM called, pt intubated due to increased WOB 9/07  worsening hypoxia/ARDS,  started on NMB- also progressive shock/MODS 9/08  HD cath placed. Start CRRT, off vasopressors temporarily 9/11  Off vasopressors, volume status improved, filter clotted   LINES/TUBES: ETT 9/6 > L IJ CVL 9/7 >  R groin HD cath 9/9 >  R Rad Aline >>   DISCUSSION: 50 y.o. female with ETOH cirrhosis, admitted 9/2 with HCAP. Had deterioration 9/5 after aspiration event and required intubation early AM hours 9/6 due to increased WOB and hypoxia. Has ARDS, multi-organ failure, started CVVHD 9/8. Overall condition improving 9/10.  ASSESSMENT / PLAN:  PULMONARY A: ARDS - oxygenation improving dramatically with volume removal Aspiration PNA Acute pulm edema from cirrhosis, renal failure > improving P:   PRVC 8 cc/kg  Wean PEEP / FiO2 for sats >92% VAP prevention Intermittent CXR Daily WUA / SBT Volume removal w/ CRRT  CARDIOVASCULAR A:  SVT 01/20/2016 AM - likely levophed related Septic shock - 01/20/2016, improving, complicated by cirrhosis Hx HLD P:  Monitor hemodynamics and tele Consider d/c hydrocortisone 9/12 if remains off neo Titrate neosynephrine for MAP > 55; OK to use vasopressors to facilitate volume removal Hold preadmission furosemide, spironolactone  NEUROLOGIC A:   Acute encephalopathy: multifactorial in setting of hepatic encephalopathy and respiratory failure Hx depression, ETOH use (reportedly now abstinent) P:   RASS goal 0 Daily WUA  Minimize sedation as able  Continue folic acid, thiamine Hold preadmission cyclobenzaprine, sertraline, hydroxyzine PAD protocol with PRN versed at reduced dose / fentanyl only  GASTROINTESTINAL A:   Hepatic encephalopathy Hx ETOH cirrhosis, fatty liver, ascites Protein calorie malnutrition GI prophylaxis Nutrition P:   Continue lactulose to tid Continue tube feedings Pepcid for stress ulcer prophylaxis  RENAL A:   AKI - started CVVHD 9/8 Hypophosphatemia P:   Oral phosphorus replacement with Phos-NAK tid  9/10 Appreciate nephrology Volume remove as able Monitor UOP  HEMATOLOGIC A:   Anemia - chronic without bleeding, 1 unit PRBC 9/8 Thrombocytopenia - chronic VTE Prophylaxis P:  Transfuse for Hgb < 7   Cont subcutaneous heparin for DVT prophylaxis > consider d/c with thrombocytopenia? Assess DIC panel  Cont SCD's CBC in AM  INFECTIOUS A:   Aspiration PNA Septic shock P:   Cont meropenem F/u cultures  ENDOCRINE A:   Hyperglycemia  P:   SSI while on steroids    Family updated:  No family at  bedside am 9/11.   Interdisciplinary Family Meeting v Palliative Care Meeting:  Due by: 9/13.   Canary BrimBrandi Ollis, NP-C [facility] Pgr: [phone] or if no answer [phone] 01/24/2016, 9:03 AM   Attending note: I have seen and examined the patient with nurse practitioner/resident and agree with the note. History, labs and imaging reviewed.  50 Y/O with Etoh cirrhosis admitted with CAP. She was intubated and developed ARDS, AKI after aspiration episode. Now off pressors, vent setting are coming down. She remains on CRRT.   - Start weaning trials. - Continue fluid removal via CRRT - Start weaning off steroids tomorrow.  - Continue merpenem.  Rest of plan as above.  Critical care time - 35 mins. This represents my time independent of the NPs time taking care of the pt.  Chilton GreathousePraveen Glendi Mohiuddin MD [facility] Pager [phone] If no answer or after 3pm call: [phone] 01/24/2016, 10:56 AM

## 2016-01-24 NOTE — Progress Notes (Signed)
Filter clotted, blood not returned

## 2016-01-24 NOTE — Progress Notes (Signed)
Date:  January 24, 2016 Chart reviewed for concurrent status and case management needs. Will continue to follow the patient for status change:  Remains on vent support and iv sedation. Discharge Planning: following for needs Expected discharge date: [id number] Marcelle SmilingRhonda Davis, BSN, [place]3, [place]   [phone]

## 2016-01-24 NOTE — Progress Notes (Signed)
Nutrition Follow-up  DOCUMENTATION CODES:   Not applicable  INTERVENTION:  - Will change TF regimen: Vital 1.5 @ 30 mL/hr with 60 mL Prostat BID. This regimen will provide 1480 kcal (98% estimated kcal need), 109 grams of protein, and 550 mL free water. - Continue PEPuP protocol. - Free water flush per MD/NP if desired. - RD will follow-up 9/13.  NUTRITION DIAGNOSIS:   Inadequate oral intake related to inability to eat as evidenced by NPO status. -ongoing  GOAL:   Patient will meet greater than or equal to 90% of their needs -met with current TF regimen.   MONITOR:   Vent status, TF tolerance, Weight trends, Labs, I & O's  ASSESSMENT:   50 y.o. female with a past medical history significant for alcoholic cirrhosis with recent liver failure who presents with chest pain and worsening pneumonia. The patient was admitted to the hospitalist service here in mid June for acute liver failure in the setting of alcoholic cirrhosis. She was seen by GI, started on steroids, Lasix, and spironolactone, received paracentesis (no SBP) and was discharged with GI follow up with her hepatologist at [facility]. Since then, she has stabilized, had well controlled leg swelling, no more ascites and completed her course of steroids. She is abstinent from alcohol now. About 4 days ago, she started to develop some chest pain, fever and shortness of breath.  She was seen in our ER, had a new left lower lobe infiltrate on CXR but was without hypoxia or leukocytosis and was prescribed azithromycin and discharged.  Since then, she has taken the azithromycin but feels progressively worse pain and dyspnea so today she called EMS.  EMS found her hypoxic in the high 80s, but placed CPAP because they couldn't improve her O2 sat with supplemental O2  9/11 Pt with OGT and receiving TF at goal rate: Vital 1.5 @ 45 mL/hr with 30 mL Prostat BID. This regimen is providing 1820 kcal, 103 grams of protein, and 825 mL free water.  Pt now on day #4 of CRRT with plan to continue this treatment. Drips (Versed, Fentanyl, Neo) set to "off" at time of RD visit. RN reports no issues with TF this AM and she was going to administer dose of Prostat shortly after RD visit.  CCM NP note this AM states goal for MAP: >55; also states daily wakeup assessment. Re-estimated nutrition needs this AM d/t current Ve, Tmax, CRRT and used admission weight of 68.9 kg as weight remains elevated although trending down with CRRT (now +6.1 kg from admission).  Patient is currently intubated on ventilator support MV: 6.6 L/min Temp (24hrs), Avg:97.7 F (36.5 C), Min:96.6 F (35.9 C), Max:99.4 F (37.4 C) Propofol: none  Medications reviewed; 1 mg IV folic acid/day, 50 mg IV Solu-Cortef BID, sliding scale Novolog, 20 g lactulose TID, 15 mL multivitamin via OGT/day, PRN IV Zofran, 1 packet Phos-Nak TID, 100 mg IV thiamine/day.  Labs reviewed; CBG: 132 mg/dL this AM, BUN: 37 mg/dL, creatinine: 1.14 mg/dL, Ca: 8.6 mg/dL, Mg: 2.5 mg/dL, GFR: 55 mL/min.    9/10 - Patient started on CRRT 9/8.  - Estimated needs adjusted this AM.  - Pt's weight is +41 lb since admission.  - Nimbex was d/c 9/9.  - Patient currently receiving Vital 1.5 @ 40 ml/hr with 30 ml Prostat BID. Will increase to Vital 1.5 @ 45 mL/hr.  Patient is currently intubated on ventilator support MV: 9.8 L/min Temp (24hrs), Avg:97.6 F (36.4 C), Max:98.4 F (36.9 C)  9/8 - Pt currently receiving Vital 1.2 @ 55 mL/hr which is providing 1584 kcal (96% re-estimated kcal need), 99 grams of protein, and 1071 mL free water.  - Kcal need updated this AM and continue to use admission weight of 68.9 kg as weight +12.1 kg since admission.  - Per notes, pt on ARDS protocol.  - Spoke with RN at bedside who reports that pt has not had BM in 3 days and that recording from 9/6 was a smear.  - Husband is at bedside and reports that outpatient GI doctor had encouraged pt to use Miralax;  husband states that this had been working very well for pt.  - Based on labs, worsening renal function and low Na. Will use more concentrated TF formula as RN reports pt +10L since admission.   Patient is currently intubated on ventilator support MV: 9.7 L/min Temp (24hrs), Avg:98.3 F (36.8 C), Max:98.7 F (37.1 C) BP: 113/50; MAP: 66 at time of RD visit.   Drips: Nimbex @ 2 mcg/kg/min, Versed @ 2 mg/hr, Vaso @ 0.03 units/min, Neo @ 90 mcg/min, Fentanyl @ 100 mcg/hr.      Diet Order:  Diet NPO time specified  Skin:  Reviewed, no issues  Last BM:  9/11  Height:   Ht Readings from Last 1 Encounters:  01/22/16 '5\' 6"'$  (1.676 m)    Weight:   Wt Readings from Last 1 Encounters:  01/24/16 165 lb 5.5 oz (75 kg)    Ideal Body Weight:  59.09 kg  BMI:  Body mass index is 26.69 kg/m.  Estimated Nutritional Needs:   Kcal:  6967  Protein:  103-124 grams (1.5-1.8 grams/kg)  Fluid:  per MD/NP  EDUCATION NEEDS:   No education needs identified at this time    Jarome Matin, MS, RD, LDN Inpatient Clinical Dietitian Pager # [phone] After hours/weekend pager # [phone]

## 2016-01-24 NOTE — Progress Notes (Signed)
CRRT filter changed , Dialysis restarted

## 2016-01-24 NOTE — Progress Notes (Addendum)
[facility] Progress Note   Subjective: I/O net negative 5 L, tolerating UF at -175 cc/hr.  STill 15kg up and still wet on CXR but improving. DOwn to FiO2 40% Vitals:   01/24/16 1140 01/24/16 1200 01/24/16 1244 01/24/16 1300  BP:   (!) 123/59   Pulse:  94 100   Resp:  14 18 14   Temp: 98.2 F (36.8 C)     TempSrc: Oral     SpO2:  100% 100% 100%  Weight:      Height:        Inpatient medications: . chlorhexidine  15 mL Mouth Rinse BID  . famotidine (PEPCID) IV  20 mg Intravenous Q12H  . feeding supplement (PRO-STAT SUGAR FREE 64)  60 mL Per Tube BID  . [START ON 01/25/2016] feeding supplement (VITAL 1.5 CAL)  1,000 mL Per Tube Q24H  . folic acid  1 mg Intravenous Daily  . heparin subcutaneous  5,000 Units Subcutaneous Q8H  . hydrocortisone sodium succinate  50 mg Intravenous Q12H  . insulin aspart  0-9 Units Subcutaneous Q4H  . lactulose  20 g Per Tube TID  . mouth rinse  15 mL Mouth Rinse QID  . meropenem (MERREM) IV  500 mg Intravenous Q8H  . multivitamin  15 mL Oral Daily  . sodium chloride flush  10-40 mL Intracatheter Q12H  . thiamine injection  100 mg Intravenous Daily   . sodium chloride 10 mL/hr at 01/24/16 1322  . phenylephrine (NEO-SYNEPHRINE) Adult infusion Stopped (01/23/16 1306)  . dialysis replacement fluid (prismasate) 400 mL/hr at 01/24/16 0929  . dialysis replacement fluid (prismasate) 200 mL/hr at 01/24/16 0929  . dialysate (PRISMASATE) 1,700 mL/hr at 01/24/16 1219   alteplase, fentaNYL (SUBLIMAZE) injection, fentaNYL (SUBLIMAZE) injection, heparin, heparin, levalbuterol, midazolam, [DISCONTINUED] ondansetron **OR** ondansetron (ZOFRAN) IV, sodium chloride, sodium chloride flush  Exam: Gen on vent, sedated, less edematous Throat w ETT  Chest coarse BS bilat RRR no MRG tachy Abd soft ntnd no mass  GU foley in place MS no joint effusions or deformity Ext diffuse 2+ edema of arms and legs Neuro as above  UA > orange, 0-5 rbc/ wbc,  1.014 sg, many bact   Assessment: 1. Acute kidney injury -  CRRT day #4 2. Vol overload - still up 15 kg , FiO2 better though 3. VDRF - CAP/ ARDS vs edema 4. Alcoholic cirrhosis 5. Thrombocytopenia 6. [name] Shock - off pressors    Plan - cont CRRT, cont UF -175 cc/hr , decrease dialysate rate and RF's rate   Kristina Moselle MD [facility] pager [phone]    cell [phone] 01/24/2016, 1:42 PM    Recent Labs Lab 01/23/16 0415 01/23/16 1600 01/24/16 0339  NA 137  136 138 137  138  K 4.1  4.1 4.0 4.0  4.1  CL 104  103 106 104  104  CO2 26  26 27 28  28   GLUCOSE 192*  192* 229* 190*  192*  BUN 36*  37* 36* 37*  36*  CREATININE 1.40*  1.37* 1.22* 1.14*  1.14*  CALCIUM 8.7*  8.6* 8.5* 8.6*  8.7*  PHOS 2.2* 2.2* 2.6    Recent Labs Lab 01/18/16 2136  01/23/16 0415 01/23/16 1600 01/24/16 0339  AST 68*  --   --   --   --   ALT 30  --   --   --   --   ALKPHOS 137*  --   --   --   --  BILITOT 17.3*  --   --   --   --   PROT 6.3*  --   --   --   --   ALBUMIN 2.3*  < > 2.9* 2.6* 2.8*  < > = values in this interval not displayed.  Recent Labs Lab 01/21/16 0433 01/22/16 0900 01/23/16 0415 01/24/16 0339 01/24/16 0922  WBC 19.1* 15.3* 14.0* 10.0  --   NEUTROABS 16.7*  --  11.4* 8.2*  --   HGB 6.7* 7.2* 8.0* 8.0*  --   HCT 20.3* 21.1* 23.4* 23.5*  --   MCV 107.4* 100.5* 100.9* 100.4*  --   PLT 55* 54* 53* 36* 38*   Iron/TIBC/Ferritin/ %Sat    Component Value Date/Time   IRON 80 10/28/2015 0943   TIBC NOT CALCULATED 10/28/2015 0943   FERRITIN 1,234 (H) 10/28/2015 0943   IRONPCTSAT NOT CALCULATED 10/28/2015 [id number]

## 2016-01-24 NOTE — Progress Notes (Signed)
Pharmacy Antibiotic Note  Kristina CashLaura J Morris is a 50 y.o. female with EtOH cirrhosis admitted on 01/30/2016 with CAP and subsequent aspiration on 9/6 requiring intubation. Today is day # 9 total antibiotics, Current antibiotic regimen is meropenem 1g IV q12h.  CRRT was initiated on 9/8 for AKI with volume overload.   Today, 01/24/2016:  Remains afebrile  WBC normalizing  SCr improving daily  Plan:  Adjust meropenem to 500mg  IV q8h  Continue to monitor daily for tolerance of CRRT (note interruption today due to clotted filter)  Follow up planned duration of antibiotic therapy for pneumonia ___________________________________  Height: 5\' 6"  (167.6 cm) Weight: 165 lb 5.5 oz (75 kg) IBW/kg (Calculated) : 59.3  Temp (24hrs), Avg:97.7 F (36.5 C), Min:96.6 F (35.9 C), Max:99.4 F (37.4 C)   Recent Labs Lab 01/19/16 0340 01/19/16 1234 01/20/16 0330  01/21/16 0433 01/21/16 1224  01/22/16 0500 01/22/16 0900 01/22/16 1540 01/23/16 0415 01/23/16 1600 01/24/16 0339  WBC 16.3*  --  17.0*  --  19.1*  --   --   --  15.3*  --  14.0*  --  10.0  CREATININE 1.50*  1.46*  --  2.35*  < > 3.22*  --   < > 2.22*  2.24*  --  1.75* 1.40*  1.37* 1.22* 1.14*  1.14*  LATICACIDVEN 1.3 1.3  --   --   --   --   --   --   --   --   --   --   --   VANCORANDOM  --   --   --   --   --  14  --   --   --   --   --   --   --   < > = values in this interval not displayed.  Estimated Creatinine Clearance: 61.1 mL/min (by C-G formula based on SCr of 1.14 mg/dL).    Allergies  Allergen Reactions  . Avelox [Moxifloxacin Hcl In Nacl] Shortness Of Breath, Itching and Rash    GI Upset  . Bee Venom Anaphylaxis  . Doxycycline Nausea Only and Rash  . Amoxicillin Rash    Upset stomach   . Erythromycin Rash    Upset stomach  . Sulfa Antibiotics Rash    Upset stomach    Antimicrobials this admission: 9/2 Vancomycin >> 9/3>> resumed 9/7>> 9/2 Cefepime >> 9/7 9/5 Flagyl >> 9/7 9/7  Meropenem>>  Levels/dose changes this admission: 9/8 Vancomycin random level at 1200 =  14 (~25 hr after 1gm dose)--> 1gm x1   Microbiology results: 9/2 BCx x2: NGF 9/3 UCx: NGF 9/3 MRSA PCR: neg 9/6 BCx x2: ngtd 9/6 Trach asp: neg FINAL  Thank you for allowing pharmacy to be a part of this patient's care.  Loralee PacasErin Xylia Scherger, PharmD, BCPS Pager: [phone] 01/24/2016 10:37 AM

## 2016-01-25 ENCOUNTER — Inpatient Hospital Stay (HOSPITAL_COMMUNITY): Payer: 59

## 2016-01-25 LAB — PROCALCITONIN: PROCALCITONIN: 0.65 ng/mL

## 2016-01-25 LAB — RENAL FUNCTION PANEL
ALBUMIN: 2.9 g/dL — AB (ref 3.5–5.0)
Albumin: 2.8 g/dL — ABNORMAL LOW (ref 3.5–5.0)
Anion gap: 4 — ABNORMAL LOW (ref 5–15)
Anion gap: 4 — ABNORMAL LOW (ref 5–15)
BUN: 39 mg/dL — AB (ref 6–20)
BUN: 42 mg/dL — AB (ref 6–20)
CHLORIDE: 106 mmol/L (ref 101–111)
CHLORIDE: 105 mmol/L (ref 101–111)
CO2: 29 mmol/L (ref 22–32)
CO2: 29 mmol/L (ref 22–32)
CREATININE: 0.94 mg/dL (ref 0.44–1.00)
CREATININE: 1.05 mg/dL — AB (ref 0.44–1.00)
Calcium: 9.1 mg/dL (ref 8.9–10.3)
Calcium: 9.3 mg/dL (ref 8.9–10.3)
GFR calc Af Amer: >60 mL/min (ref >60)
GFR calc non Af Amer: >60 mL/min (ref >60)
Glucose, Bld: 176 mg/dL — ABNORMAL HIGH (ref 65–99)
Glucose, Bld: 179 mg/dL — ABNORMAL HIGH (ref 65–99)
PHOSPHORUS: 3.5 mg/dL (ref 2.5–4.6)
POTASSIUM: 4.2 mmol/L (ref 3.5–5.1)
Phosphorus: 3.2 mg/dL (ref 2.5–4.6)
Potassium: 4.5 mmol/L (ref 3.5–5.1)
Sodium: 139 mmol/L (ref 135–145)
Sodium: 138 mmol/L (ref 135–145)

## 2016-01-25 LAB — CBC
HCT: 25.9 % — ABNORMAL LOW (ref 36.0–46.0)
HEMOGLOBIN: 8.6 g/dL — AB (ref 12.0–15.0)
MCH: 34.7 pg — AB (ref 26.0–34.0)
MCHC: 33.2 g/dL (ref 30.0–36.0)
MCV: 104.4 fL — AB (ref 78.0–100.0)
Platelets: 54 10*3/uL — ABNORMAL LOW (ref 150–400)
RBC: 2.48 MIL/uL — AB (ref 3.87–5.11)
RDW: 23.9 % — ABNORMAL HIGH (ref 11.5–15.5)
WBC: 12.9 10*3/uL — ABNORMAL HIGH (ref 4.0–10.5)

## 2016-01-25 LAB — GLUCOSE, CAPILLARY
GLUCOSE-CAPILLARY: 149 mg/dL — AB (ref 65–99)
GLUCOSE-CAPILLARY: 150 mg/dL — AB (ref 65–99)
GLUCOSE-CAPILLARY: 175 mg/dL — AB (ref 65–99)
GLUCOSE-CAPILLARY: 196 mg/dL — AB (ref 65–99)
Glucose-Capillary: 195 mg/dL — ABNORMAL HIGH (ref 65–99)
Glucose-Capillary: 154 mg/dL — ABNORMAL HIGH (ref 65–99)

## 2016-01-25 LAB — AMMONIA: Ammonia: 112 umol/L — ABNORMAL HIGH (ref 9–35)

## 2016-01-25 LAB — MAGNESIUM: Magnesium: 2.6 mg/dL — ABNORMAL HIGH (ref 1.7–2.4)

## 2016-01-25 LAB — APTT

## 2016-01-25 MED ORDER — FAMOTIDINE 20 MG PO TABS
20.0000 mg | ORAL_TABLET | Freq: Two times a day (BID) | ORAL | Status: DC
  Administered 2016-01-25 – 2016-01-27 (×5): 20 mg via ORAL
  Filled 2016-01-25 (×5): qty 1

## 2016-01-25 MED ORDER — JEVITY 1.5 CAL/FIBER PO LIQD
720.0000 mL | ORAL | Status: DC
  Administered 2016-01-25: 11:00:00
  Administered 2016-01-25: 720 mL
  Administered 2016-01-26: 18:00:00
  Filled 2016-01-25 (×4): qty 1000

## 2016-01-25 NOTE — Progress Notes (Signed)
CRITICAL VALUE ALERT  Critical value received: APTT> 200  Date of notification:  01/25/16  Time of notification:  0638  Critical value read back: yes  Nurse who received alert:  Genell Garland  Called Elink. Elink nurse notified.

## 2016-01-25 NOTE — Progress Notes (Signed)
Nutrition Follow-up  DOCUMENTATION CODES:   Not applicable  INTERVENTION:  - Will change TF regimen: Jevity 1.5 @ 30 mL/hr with 60 mL Prostat BID. This regimen will provide 1480 kcal, 106 grams of protein, and 547 mL free water. - Continue PEPuP protocol. - Free water per MD/NP, if desired.  - RD will follow-up 9/13.  NUTRITION DIAGNOSIS:   Inadequate oral intake related to inability to eat as evidenced by NPO status. -ongoing  GOAL:   Patient will meet greater than or equal to 90% of their needs -met with current TF regimen.  MONITOR:   Vent status, TF tolerance, Weight trends, Labs, I & O's  ASSESSMENT:   50 y.o. female with a past medical history significant for alcoholic cirrhosis with recent liver failure who presents with chest pain and worsening pneumonia. The patient was admitted to the hospitalist service here in mid June for acute liver failure in the setting of alcoholic cirrhosis. She was seen by GI, started on steroids, Lasix, and spironolactone, received paracentesis (no SBP) and was discharged with GI follow up with her hepatologist at [facility]. Since then, she has stabilized, had well controlled leg swelling, no more ascites and completed her course of steroids. She is abstinent from alcohol now. About 4 days ago, she started to develop some chest pain, fever and shortness of breath.  She was seen in our ER, had a new left lower lobe infiltrate on CXR but was without hypoxia or leukocytosis and was prescribed azithromycin and discharged.  Since then, she has taken the azithromycin but feels progressively worse pain and dyspnea so today she called EMS.  EMS found her hypoxic in the high 80s, but placed CPAP because they couldn't improve her O2 sat with supplemental O2  9/12 Pt continues with OGT and is receiving TF at updated goal: Vital 1.5 @ 30 mL/hr with 60 mL Prostat BID. This regimen is providing 1480 kcal,109 grams of protein, and 550 mL free water. Estimated kcal  need updated this AM; continue to use weight of 68.9 kg (admission weight) to calculate needs. Weight down 9.2 kg since yesterday and now only +0.7 kg from admission weight. Will continue to monitor weight trends and continue to adjust as needed.   Spoke with RN at bedside. She reports that pt now having a large amount of stool output via flexiseal (800-900cc per shift). Lactulose order continues and ammonia remains elevated. Will order fiber-containing TF and follow-up tomorrow to assess tolerance and any changes related to change in TF regimen. Pt now on day #5 of CRRT.   Patient is currently intubated on ventilator support MV: 7.1 L/min Temp (24hrs), Avg:97.4 F (36.3 C), Min:97 F (36.1 C), Max:98.2 F (36.8 C) BP: 141/62 and MAP: 84 at time of RD visit.    Medications reviewed; 1 mg IV folic acid/day, sliding scale Novolog, 20 g lactulose TID, 15 mL liquid multivitamin per OGT/day, PRN IV Zofran, 100 mg IV thiamine/day.  Labs reviewed; CBGs: 149 and 175 mg/dL, BUN: 39 mg/dL, Mg: 2.6 mg/dL, ammonia: 112 umol/L.    9/11 - Pt with OGT and receiving TF at goal rate: Vital 1.5 @ 45 mL/hr with 30 mL Prostat BID.  - This regimen is providing 1820 kcal, 103 grams of protein, and 825 mL free water.  - Pt now on day #4 of CRRT with plan to continue this treatment.  - Drips (Versed, Fentanyl, Neo) set to "off" at time of RD visit.  - RN reports no issues  with TF this AM and she was going to administer dose of Prostat shortly after RD visit. - CCM NP note this AM states goal for MAP: >55; also states daily wakeup assessment. - Re-estimated nutrition needs this AM d/t current Ve, Tmax, CRRT and used admission weight of 68.9 kg as weight remains elevated although trending down with CRRT (now +6.1 kg from admission).  Patient is currently intubated on ventilator support MV: 6.6 L/min Temp (24hrs), Avg:97.7 F (36.5 C), Max:99.4 F (37.4 C)    9/10 - Patient started on CRRT 9/8.  -  Estimated needs adjusted this AM.  - Pt's weight is +41 lb since admission.  - Nimbex was d/c 9/9.  - Patient currently receiving Vital 1.5 @ 40 ml/hr with 30 ml Prostat BID. Will increase to Vital 1.5 @ 45 mL/hr.  Patient is currently intubated on ventilator support MV: 9.8L/min Temp (24hrs), Avg:97.6 F (36.4 C), Max:98.4 F (36.9 C)    Diet Order:  Diet NPO time specified  Skin:  Reviewed, no issues  Last BM:  9/12  Height:   Ht Readings from Last 1 Encounters:  01/22/16 '5\' 6"'$  (1.676 m)    Weight:   Wt Readings from Last 1 Encounters:  01/25/16 152 lb 5.4 oz (69.1 kg)    Ideal Body Weight:  59.09 kg  BMI:  Body mass index is 24.59 kg/m.  Estimated Nutritional Needs:   Kcal:  1429  Protein:  103-124 grams (1.5-1.8 grams/kg)  Fluid:  per MD/NP  EDUCATION NEEDS:   No education needs identified at this time    Jarome Matin, MS, RD, LDN Inpatient Clinical Dietitian Pager # [phone] After hours/weekend pager # [phone]

## 2016-01-25 NOTE — Progress Notes (Signed)
[facility] Progress Note   Subjective: I/O net negative nearly 4 L, tolerating UF at -175 cc/hr.  STill  wet on CXR but improving. DOwn to FiO2 40% Vitals:   01/25/16 0818 01/25/16 0900 01/25/16 1000 01/25/16 1200  BP: 131/60     Pulse: 93     Resp: 15 15 14 14   Temp:      TempSrc:      SpO2: 100% 100% 100% 100%  Weight:      Height:        Inpatient medications: . chlorhexidine  15 mL Mouth Rinse BID  . famotidine  20 mg Oral BID  . feeding supplement (JEVITY 1.5 CAL/FIBER)  720 mL Per Tube Q24H  . feeding supplement (PRO-STAT SUGAR FREE 64)  60 mL Per Tube BID  . folic acid  1 mg Intravenous Daily  . insulin aspart  0-9 Units Subcutaneous Q4H  . lactulose  20 g Per Tube TID  . mouth rinse  15 mL Mouth Rinse QID  . meropenem (MERREM) IV  500 mg Intravenous Q8H  . multivitamin  15 mL Oral Daily  . sodium chloride flush  10-40 mL Intracatheter Q12H  . thiamine injection  100 mg Intravenous Daily   . sodium chloride 10 mL/hr at 01/25/16 1200  . dialysis replacement fluid (prismasate) 200 mL/hr at 01/24/16 1346  . dialysis replacement fluid (prismasate) 200 mL/hr at 01/25/16 1045  . dialysate (PRISMASATE) 1,400 mL/hr at 01/25/16 0952   alteplase, fentaNYL (SUBLIMAZE) injection, fentaNYL (SUBLIMAZE) injection, heparin, heparin, levalbuterol, midazolam, [DISCONTINUED] ondansetron **OR** ondansetron (ZOFRAN) IV, sodium chloride, sodium chloride flush  Exam: Gen on vent, sedated, less edematous Throat w ETT  Chest coarse BS bilat RRR no MRG tachy Abd soft ntnd no mass  GU foley in place MS no joint effusions or deformity Ext diffuse 2+ edema in hips Neuro as above  UA > orange, 0-5 rbc/ wbc, 1.014 sg, many bact   Assessment: 1. Acute kidney injury -  Creatinine on 8/24 was 0.6 - CRRT day #5. No UOP.  Since needing so much fluid off - will keep CRRT going for another 24 hours mostly for volume- otherwise is stable enough to attempt a trial off of HD-  will likely stop CRRT tomorrow and challenge with lasix.  Since renal function normal at baseline am hopeful for eventual kidney recovery  2. Vol overload -  FiO2 better though- keep pulling volume for now  3. VDRF - CAP/ ARDS vs edema 4. Alcoholic cirrhosis 5. Thrombocytopenia 6. Kristina Morris Shock - off pressors      Kristina Morris A  01/25/2016, 12:07 PM    Recent Labs Lab 01/24/16 0339 01/24/16 1545 01/25/16 0435  NA 137  138 138 139  K 4.0  4.1 4.2 4.5  CL 104  104 105 106  CO2 28  28 27 29   GLUCOSE 190*  192* 194* 176*  BUN 37*  36* 39* 39*  CREATININE 1.14*  1.14* 1.10* 0.94  CALCIUM 8.6*  8.7* 8.7* 9.1  PHOS 2.6 2.7 3.2    Recent Labs Lab 01/18/16 2136  01/24/16 0339 01/24/16 1545 01/25/16 0435  AST 68*  --   --   --   --   ALT 30  --   --   --   --   ALKPHOS 137*  --   --   --   --   BILITOT 17.3*  --   --   --   --   PROT  6.3*  --   --   --   --   ALBUMIN 2.3*  < > 2.8* 2.7* 2.8*  < > = values in this interval not displayed.  Recent Labs Lab 01/21/16 0433  01/23/16 0415 01/24/16 0339 01/24/16 0922 01/25/16 0435  WBC 19.1*  < > 14.0* 10.0  --  12.9*  NEUTROABS 16.7*  --  11.4* 8.2*  --   --   HGB 6.7*  < > 8.0* 8.0*  --  8.6*  HCT 20.3*  < > 23.4* 23.5*  --  25.9*  MCV 107.4*  < > 100.9* 100.4*  --  104.4*  PLT 55*  < > 53* 36* 38* 54*  < > = values in this interval not displayed. Iron/TIBC/Ferritin/ %Sat    Component Value Date/Time   IRON 80 10/28/2015 0943   TIBC NOT CALCULATED 10/28/2015 0943   FERRITIN 1,234 (H) 10/28/2015 0943   IRONPCTSAT NOT CALCULATED 10/28/2015 [id number]

## 2016-01-25 NOTE — Progress Notes (Signed)
eLink Physician-Brief Progress Note Patient Name: Kristina CashLaura J Cumbee DOB: 09/23/1965 MRN: [id number]   Date of Service  01/25/2016  HPI/Events of Note  PTT > 200.  INR 2.48.   eICU Interventions  Hold SQ heparin.  Monitor for bleeding.      Intervention Category Major Interventions: Other:  Chesley Veasey 01/25/2016, 6:46 AM

## 2016-01-25 NOTE — Progress Notes (Signed)
Key Points: Use following P&T approved IV to PO non-antibiotic change policy.  Description contains the criteria that are approved Note: Policy Excludes:  Esophagectomy patientsPHARMACIST - PHYSICIAN COMMUNICATION DR:  Craige CottaSood CONCERNING: IV to Oral Route Change Policy  RECOMMENDATION: This patient is receiving pepcid by the intravenous route.  Based on criteria approved by the Pharmacy and Therapeutics Committee, the intravenous medication(s) is/are being converted to the equivalent oral dose form(s).   DESCRIPTION: These criteria include:  The patient is eating (either orally or via tube) and/or has been taking other orally administered medications for a least 24 hours  The patient has no evidence of active gastrointestinal bleeding or impaired GI absorption (gastrectomy, short bowel, patient on TNA or NPO).  If you have questions about this conversion, please contact the Pharmacy Department  []   [phone] )  [facility] []   [phone]( [phone] )  [facility]  []   [phone]( [phone] )  [facility] [x]   [phone]( [phone] )  [facility]  Earl ManyLegge, Bow Buntyn ElyMarshall, [facility]PH 01/25/2016 8:27 AM

## 2016-01-25 NOTE — Progress Notes (Signed)
Wasted 120 cc Fentanyl drip in sink and watched per Lorrin JacksonAmy Noraa Pickeral RN and Oren BeckmannGenell Garland RN.

## 2016-01-25 NOTE — Progress Notes (Signed)
PULMONARY / CRITICAL CARE MEDICINE   Name: Kristina Morris MRN: [id number] DOB: [birth year]    ADMISSION DATE:  01/14/2016 CONSULTATION DATE:  01/19/16  REFERRING MD:  Cena Benton - TRH  CHIEF COMPLAINT:  AMS, increased WOB  BRIEF:  50 y/o female with EtOH cirrhosis admitted on 9/2 with CAP, aspirated on 9/6 requiring intubation.  Subsequently, developed ARDS, multi-organ failure and AKI on CRRT.    SUBJECTIVE:   PTT >200 overnight, heparin held.  RN reports ongoing liquid stool. WBC 12.9, afebrile.  Failed SBT.  VITAL SIGNS: BP 131/60   Pulse 93   Temp 97.4 F (36.3 C) (Axillary)   Resp 15   Ht 5\' 6"  (1.676 m)   Wt 152 lb 5.4 oz (69.1 kg)   SpO2 100%   BMI 24.59 kg/m   HEMODYNAMICS: CVP:  [9 mmHg-14 mmHg] 14 mmHg  VENTILATOR SETTINGS: Vent Mode: PRVC FiO2 (%):  [40 %] 40 % Set Rate:  [15 bmp] 15 bmp Vt Set:  [400 mL] 400 mL PEEP:  [5 cmH20] 5 cmH20 Plateau Pressure:  [7 cmH20-14 cmH20] 7 cmH20  INTAKE / OUTPUT: I/O last 3 completed shifts: In: 2931.3 [I.V.:540.5; Other:71; NG/GT:1819.8; IV Piggyback:500] Out: 4098 [Other:7228; Stool:1510]   PHYSICAL EXAMINATION: General: chronically ill appearing on vent, jaundice HENT: Scleral icterus, ETT in place PULM: non-labored, clear to auscultation  CV: RRR, no mgr GI: distended abdomen but soft, +BS Derm: echymosis noted, edema improved Neuro: altered, open eyes to voice, looks at provider when speaking briefly, wiggles toes to command LABS:  BMET  Recent Labs Lab 01/24/16 0339 01/24/16 1545 01/25/16 0435  NA 137  138 138 139  K 4.0  4.1 4.2 4.5  CL 104  104 105 106  CO2 28  28 27 29   BUN 37*  36* 39* 39*  CREATININE 1.14*  1.14* 1.10* 0.94  GLUCOSE 190*  192* 194* 176*    Electrolytes  Recent Labs Lab 01/23/16 0415  01/24/16 0339 01/24/16 1545 01/25/16 0435  CALCIUM 8.7*  8.6*  < > 8.6*  8.7* 8.7* 9.1  MG 2.4  --  2.5*  --  2.6*  PHOS 2.2*  < > 2.6 2.7 3.2  < > = values in this interval not  displayed.  CBC  Recent Labs Lab 01/23/16 0415 01/24/16 0339 01/24/16 0922 01/25/16 0435  WBC 14.0* 10.0  --  12.9*  HGB 8.0* 8.0*  --  8.6*  HCT 23.4* 23.5*  --  25.9*  PLT 53* 36* 38* 54*    Coag's  Recent Labs Lab 01/19/16 1709  01/24/16 0339 01/24/16 0922 01/25/16 0435  APTT 45*  < > 47* 62* >200*  INR 2.67  --   --  2.25  --   < > = values in this interval not displayed.  Sepsis Markers  Recent Labs Lab 01/19/16 0340 01/19/16 0714 01/19/16 1234  LATICACIDVEN 1.3  --  1.3  PROCALCITON  --  0.54  --     ABG  Recent Labs Lab 01/22/16 0855 01/23/16 0440 01/23/16 1525  PHART 7.336* 7.382 7.352  PCO2ART 45.5 44.0 48.7*  PO2ART 135* 80.9* 90.4    Liver Enzymes  Recent Labs Lab 01/18/16 2136  01/24/16 0339 01/24/16 1545 01/25/16 0435  AST 68*  --   --   --   --   ALT 30  --   --   --   --   ALKPHOS 137*  --   --   --   --  BILITOT 17.3*  --   --   --   --   ALBUMIN 2.3*  < > 2.8* 2.7* 2.8*  < > = values in this interval not displayed.  Cardiac Enzymes  Recent Labs Lab 01/19/16 0340 01/19/16 0923 01/19/16 1555  TROPONINI <0.03 <0.03 <0.03    Glucose  Recent Labs Lab 01/24/16 1118 01/24/16 1527 01/24/16 1941 01/24/16 2343 01/25/16 0328 01/25/16 0743  GLUCAP 186* 177* 170* 168* 175* 149*    Imaging 9/10 CXR images reviewed: bilateral interstitial infiltrates again noted with air space disease, improving    STUDIES:  CXR 9/5 >> multifocal PNA ECHO 9/7 >>EF 65-70% no wall motion abnormalities. PAS estimated at 32 mmHg peak.   CULTURES: Blood 9/2 >> neg Urine 9/2 >> neg Sputum 9/6 >> normal flora Blood 9/6 >> neg  ANTIBIOTICS: Cefepime 9/2 >> 9/7 Flagyl 9/5 >> 9/7 Meropenem 9/7 >>  Vanc 9/7 >> 9/9  SIGNIFICANT EVENTS: 9/02  Admit 9/06  PCCM called, pt intubated due to increased WOB 9/07  worsening hypoxia/ARDS, started on NMB- also progressive shock/MODS 9/08  HD cath placed. Start CRRT, off vasopressors  temporarily 9/11  Off vasopressors, volume status improved, filter clotted  9/12  More alert, failed SBT.   LINES/TUBES: ETT 9/6 > L IJ CVL 9/7 >  R groin HD cath 9/9 >  R Rad Aline >>   DISCUSSION: 50 y.o. female with ETOH cirrhosis, admitted 9/2 with HCAP. Had deterioration 9/5 after aspiration event and required intubation early AM hours 9/6 due to increased WOB and hypoxia. Has ARDS, multi-organ failure, started CVVHD 9/8. Overall condition improving 9/10.  ASSESSMENT / PLAN:  PULMONARY A: ARDS - oxygenation improving dramatically with volume removal Aspiration PNA - improved Acute pulm edema from cirrhosis, renal failure > improving P:   PRVC 8 cc/kg  Wean PEEP / FiO2 for sats >92% VAP prevention measures Intermittent CXR Daily WUA / SBT Volume removal w/ CRRT  CARDIOVASCULAR A:  SVT 01/20/2016 AM - likely levophed related Septic shock - 01/20/2016, improving, complicated by cirrhosis Hx HLD P:  Monitor hemodynamics and tele D/c hydrocortisone 9/12  Monitor off vasopressors / stress dose steroids  Hold preadmission furosemide, spironolactone  NEUROLOGIC A:   Acute encephalopathy: multifactorial in setting of hepatic encephalopathy and respiratory failure Hx depression, ETOH use (reportedly now abstinent) P:   RASS goal 0 Daily WUA  Minimize sedation as able  Continue folic acid, thiamine Hold preadmission cyclobenzaprine, sertraline, hydroxyzine PAD protocol with PRN versed at reduced dose / fentanyl only  GASTROINTESTINAL A:   Hepatic encephalopathy - rising ammonia 9/12 Hx ETOH cirrhosis, fatty liver, ascites Protein calorie malnutrition GI prophylaxis Nutrition P:   Continue lactulose to tid Continue tube feedings Pepcid for stress ulcer prophylaxis  RENAL A:   AKI - started CVVHD 9/8 Hypophosphatemia P:   Oral phosphorus replacement with Phos  Appreciate nephrology Volume remove as able, tolerating negative balance thus far Monitor  UOP  HEMATOLOGIC A:   Anemia - chronic without bleeding, 1 unit PRBC 9/8 Thrombocytopenia - chronic VTE Prophylaxis P:  Transfuse for Hgb < 7   Hold Heparin with elevated PTT (>200) Assess DIC panel > PT 25, INR 2.25, PTT 62, Fibrinogen 266, D-Dimer 17.45 Cont SCD's Trend CBC   INFECTIOUS A:   Aspiration PNA - improved 9/12 Septic shock P:   Cont meropenem Re-assess PCT, to limit duration of abx F/u cultures  ENDOCRINE A:   Hyperglycemia  P:   SSI while on steroids  Family updated:  Husband Raiford Noble) updated at bedside am 9/12.     Interdisciplinary Family Meeting v Palliative Care Meeting:  Due by: 9/13.   Canary Brim, NP-C [facility] Pgr: [phone] or if no answer [phone] 01/25/2016, 8:36 AM    Attending note: I have seen and examined the patient with nurse practitioner/resident and agree with the note. History, labs and imaging reviewed.  50 Y/O with Etoh cirrhosis admitted with CAP. She was intubated and developed ARDS, AKI after aspiration episode. Now off pressors, vent setting are coming down. She remains on CRRT. Slightly elevated WBC count noted, no fevers  - Continue weaning trials. - Continue fluid removal via CRRT - D/C stress dose steroids and A line - Continue merpenem. Recheck Pct. Culture if she spikes a temp  Rest of plan as above. Husband updated 9/12.  Critical care time - 35 mins. This represents my time independent of the NPs time taking care of the pt.  Chilton Greathouse MD [facility] Pager [phone] If no answer or after 3pm call: [phone] 01/25/2016, 10:44 AM

## 2016-01-26 LAB — CBC
HCT: 27.6 % — ABNORMAL LOW (ref 36.0–46.0)
Hemoglobin: 9.1 g/dL — ABNORMAL LOW (ref 12.0–15.0)
MCH: 35 pg — ABNORMAL HIGH (ref 26.0–34.0)
MCHC: 33 g/dL (ref 30.0–36.0)
MCV: 106.2 fL — ABNORMAL HIGH (ref 78.0–100.0)
PLATELETS: 59 10*3/uL — AB (ref 150–400)
RBC: 2.6 MIL/uL — ABNORMAL LOW (ref 3.87–5.11)
RDW: 24.7 % — AB (ref 11.5–15.5)
WBC: 16 10*3/uL — AB (ref 4.0–10.5)

## 2016-01-26 LAB — RENAL FUNCTION PANEL
ALBUMIN: 3 g/dL — AB (ref 3.5–5.0)
ANION GAP: 7 (ref 5–15)
Albumin: 2.8 g/dL — ABNORMAL LOW (ref 3.5–5.0)
Anion gap: 7 (ref 5–15)
BUN: 43 mg/dL — ABNORMAL HIGH (ref 6–20)
BUN: 48 mg/dL — AB (ref 6–20)
CALCIUM: 9.6 mg/dL (ref 8.9–10.3)
CHLORIDE: 107 mmol/L (ref 101–111)
CO2: 28 mmol/L (ref 22–32)
CO2: 26 mmol/L (ref 22–32)
CREATININE: 1.28 mg/dL — AB (ref 0.44–1.00)
Calcium: 9.8 mg/dL (ref 8.9–10.3)
Chloride: 105 mmol/L (ref 101–111)
Creatinine, Ser: 1.08 mg/dL — ABNORMAL HIGH (ref 0.44–1.00)
GFR calc non Af Amer: 59 mL/min — ABNORMAL LOW (ref >60)
GFR calc non Af Amer: 48 mL/min — ABNORMAL LOW (ref >60)
GFR, EST AFRICAN AMERICAN: 56 mL/min — AB (ref >60)
GLUCOSE: 189 mg/dL — AB (ref 65–99)
Glucose, Bld: 147 mg/dL — ABNORMAL HIGH (ref 65–99)
PHOSPHORUS: 3.3 mg/dL (ref 2.5–4.6)
POTASSIUM: 3.8 mmol/L (ref 3.5–5.1)
Phosphorus: 4 mg/dL (ref 2.5–4.6)
Potassium: 3.9 mmol/L (ref 3.5–5.1)
SODIUM: 140 mmol/L (ref 135–145)
SODIUM: 140 mmol/L (ref 135–145)

## 2016-01-26 LAB — GLUCOSE, CAPILLARY
GLUCOSE-CAPILLARY: 162 mg/dL — AB (ref 65–99)
GLUCOSE-CAPILLARY: 127 mg/dL — AB (ref 65–99)
Glucose-Capillary: 129 mg/dL — ABNORMAL HIGH (ref 65–99)
Glucose-Capillary: 131 mg/dL — ABNORMAL HIGH (ref 65–99)
Glucose-Capillary: 164 mg/dL — ABNORMAL HIGH (ref 65–99)
Glucose-Capillary: 181 mg/dL — ABNORMAL HIGH (ref 65–99)

## 2016-01-26 LAB — APTT: APTT: 39 s — AB (ref 24–36)

## 2016-01-26 LAB — C DIFFICILE QUICK SCREEN W PCR REFLEX
C DIFFICILE (CDIFF) TOXIN: NEGATIVE
C DIFFICLE (CDIFF) ANTIGEN: NEGATIVE
C Diff interpretation: NOT DETECTED

## 2016-01-26 LAB — HEPATIC FUNCTION PANEL
ALBUMIN: 3.1 g/dL — AB (ref 3.5–5.0)
ALK PHOS: 198 U/L — AB (ref 38–126)
ALT: 48 U/L (ref 14–54)
AST: 69 U/L — ABNORMAL HIGH (ref 15–41)
BILIRUBIN TOTAL: 10.9 mg/dL — AB (ref 0.3–1.2)
Bilirubin, Direct: 5.1 mg/dL — ABNORMAL HIGH (ref 0.1–0.5)
Indirect Bilirubin: 5.8 mg/dL — ABNORMAL HIGH (ref 0.3–0.9)
TOTAL PROTEIN: 7.3 g/dL (ref 6.5–8.1)

## 2016-01-26 LAB — PROCALCITONIN: PROCALCITONIN: 0.7 ng/mL

## 2016-01-26 LAB — MAGNESIUM: Magnesium: 2.5 mg/dL — ABNORMAL HIGH (ref 1.7–2.4)

## 2016-01-26 MED ORDER — MIDAZOLAM HCL 2 MG/2ML IJ SOLN
2.0000 mg | INTRAMUSCULAR | Status: DC | PRN
  Administered 2016-01-27: 2 mg via INTRAVENOUS
  Filled 2016-01-26: qty 2

## 2016-01-26 MED ORDER — SODIUM CHLORIDE 0.9 % IV SOLN
25.0000 ug/h | INTRAVENOUS | Status: DC
  Administered 2016-01-27: 100 ug/h via INTRAVENOUS
  Administered 2016-01-27: 250 ug/h via INTRAVENOUS
  Administered 2016-01-28 – 2016-01-30 (×3): 125 ug/h via INTRAVENOUS
  Filled 2016-01-26 (×6): qty 50

## 2016-01-26 MED ORDER — WHITE PETROLATUM GEL
Status: AC
Start: 2016-01-26 — End: 2016-01-26
  Administered 2016-01-26: 17:00:00
  Filled 2016-01-26: qty 1

## 2016-01-26 MED ORDER — MIDAZOLAM HCL 2 MG/2ML IJ SOLN
2.0000 mg | INTRAMUSCULAR | Status: DC | PRN
  Administered 2016-01-26 – 2016-01-27 (×2): 2 mg via INTRAVENOUS
  Filled 2016-01-26: qty 2

## 2016-01-26 MED ORDER — MIDAZOLAM HCL 2 MG/2ML IJ SOLN
INTRAMUSCULAR | Status: AC
  Administered 2016-01-26: 2 mg via INTRAVENOUS
  Filled 2016-01-26: qty 2

## 2016-01-26 MED ORDER — LACTULOSE 10 GM/15ML PO SOLN
20.0000 g | Freq: Two times a day (BID) | ORAL | Status: DC
  Administered 2016-01-26 – 2016-01-30 (×8): 20 g
  Filled 2016-01-26 (×8): qty 30

## 2016-01-26 MED ORDER — FENTANYL BOLUS VIA INFUSION
50.0000 ug | INTRAVENOUS | Status: DC | PRN
  Administered 2016-01-27 (×2): 50 ug via INTRAVENOUS
  Filled 2016-01-26: qty 50

## 2016-01-26 MED ORDER — RIFAXIMIN 550 MG PO TABS
550.0000 mg | ORAL_TABLET | Freq: Two times a day (BID) | ORAL | Status: DC
  Administered 2016-01-26 – 2016-01-30 (×9): 550 mg via ORAL
  Filled 2016-01-26 (×9): qty 1

## 2016-01-26 NOTE — Progress Notes (Signed)
Nutrition Follow-up  DOCUMENTATION CODES:   Not applicable  INTERVENTION:  - Continue Jevity 1.5 @ 30 mL/hr with 60 mL Prostat BID.  - Continue PEPuP protocol. - RD will follow-up 9/14.  NUTRITION DIAGNOSIS:   Inadequate oral intake related to inability to eat as evidenced by NPO status. -ongoing  GOAL:   Patient will meet greater than or equal to 90% of their needs -met with current TF regimen.   MONITOR:   Vent status, TF tolerance, Weight trends, Labs, I & O's  ASSESSMENT:   50 y.o. female with a past medical history significant for alcoholic cirrhosis with recent liver failure who presents with chest pain and worsening pneumonia. The patient was admitted to the hospitalist service here in mid June for acute liver failure in the setting of alcoholic cirrhosis. She was seen by GI, started on steroids, Lasix, and spironolactone, received paracentesis (no SBP) and was discharged with GI follow up with her hepatologist at [facility]. Since then, she has stabilized, had well controlled leg swelling, no more ascites and completed her course of steroids. She is abstinent from alcohol now. About 4 days ago, she started to develop some chest pain, fever and shortness of breath.  She was seen in our ER, had a new left lower lobe infiltrate on CXR but was without hypoxia or leukocytosis and was prescribed azithromycin and discharged.  Since then, she has taken the azithromycin but feels progressively worse pain and dyspnea so today she called EMS.  EMS found her hypoxic in the high 80s, but placed CPAP because they couldn't improve her O2 sat with supplemental O2  9/13 Pt continues with OGT and receiving TF at goal rate: Jevity 1.5 @ 30 mL/hr with 60 mL Prostat BID. This regimen is providing 1480 kcal, 106 grams of protein, and 547 mL free water. Estimated kcal need updated this AM and based on CBW as weight continues to trend down with pt now on day #6 of CRRT. Weight -5.5 kg since yesterday.  Current TF regimen is providing 106% estimated kcal need; will adjust tomorrow if warranted.   Spoke with RN who reports that pt had ~75cc output from flexiseal between 0600 and 0700 and no further output this shift. She states dose of lactulose is due soon.   Note per nephrology this AM states pt to remain on CRRT for the next 24 hours and then will re-assess; possible need for transfer to [facility] with intermittent HD. Will continue to monitor and adjust estimated protein needs as warranted based on these events.   Patient is currently intubated on ventilator support MV: 8.3 L/min Temp (24hrs), Avg:98 F (36.7 C), Min:98 F (36.7 C), Max:98 F (36.7 C)  Medications reviewed; 1 mg IV folic acid/day, sliding scale Novolog, 20 g lactulose TID, 15 mL liquid multivitamin via OGT/day, PRN IV Zofran, 100 mg IV thiamine/day.   Labs reviewed; CBGs: 129 and 164 mg/dL this AM, BUN: 43 mg/dL, creatinine: 1.08 mg/dL, Mg: 2.5 mg/dL, GFR: 59 mL/min.    9/12 - Pt with OGT and is receiving TF at updated goal: Vital 1.5 @ 30 mL/hr with 60 mL Prostat BID. - This regimen is providing 1480 kcal,109 grams of protein, and 550 mL free water.  - Weight down 9.2 kg since yesterday and now only +0.7 kg from admission weight. - Spoke with RN at bedside. She reports that pt now having a large amount of stool output via flexiseal (800-900cc per shift).  - Lactulose order continues and ammonia  remains elevated. Will order fiber-containing TF and follow-up tomorrow to assess tolerance and any changes related to change in TF regimen.  - Pt now on day #5 of CRRT.   Patient is currently intubated on ventilator support MV: 7.1 L/min Temp (24hrs), Avg:97.4 F (36.3 C), Min:97 F (36.1 C), Max:98.2 F (36.8 C) BP: 141/62 and MAP: 84 at time of RD visit.    9/11 - Pt with OGT and receiving TF at goal rate: Vital 1.5 @ 45 mL/hr with 30 mL Prostat BID.  - This regimen is providing 1820 kcal, 103 grams of protein, and  825 mL free water.  - Pt now on day #4 of CRRT with plan to continue this treatment.  - Drips (Versed, Fentanyl, Neo) set to "off" at time of RD visit.  - RN reports no issues with TF this AM and she was going to administer dose of Prostat shortly after RD visit. - CCM NP note this AM states goal for MAP: >55; also states daily wakeup assessment. - Re-estimated nutrition needs this AM d/t current Ve, Tmax, CRRT and used admission weight of 68.9 kg as weight remains elevated although trending down with CRRT (now +6.1 kg from admission).  Patient is currently intubated on ventilator support MV: 6.6L/min Temp (24hrs), Avg:97.7 F (36.5 C), Max:99.4 F (37.4 C)    Diet Order:  Diet NPO time specified  Skin:  Reviewed, no issues  Last BM:  9/13  Height:   Ht Readings from Last 1 Encounters:  01/22/16 _0  (1.676 m)    Weight:   Wt Readings from Last 1 Encounters:  01/26/16 140 lb 3.4 oz (63.6 kg)    Ideal Body Weight:  59.09 kg  BMI:  Body mass index is 22.63 kg/m.  Estimated Nutritional Needs:   Kcal:  5449  Protein:  103-124 grams (1.5-1.8 grams/kg)  Fluid:  per MD/NP  EDUCATION NEEDS:   No education needs identified at this time    Jarome Matin, MS, RD, LDN Inpatient Clinical Dietitian Pager # [phone] After hours/weekend pager # [phone]

## 2016-01-26 NOTE — Progress Notes (Signed)
[facility] Dr. Arrie Aranoladonato & Dr. Isaiah SergeMannam. [facility].  Re-assessed patient's R IJ to place HD catheter and IJ is too small for placement.  Will ask IR to review for placement of temporary HD catheter.  Pt currently has a L IJ TLC and R femoral HD catheter.  Would like to d/c femoral HD catheter with rising WBC.     Canary BrimBrandi Yordan Martindale, NP-C [facility] Pgr: [phone] or if no answer [phone] 01/26/2016, 12:28 PM

## 2016-01-26 NOTE — Progress Notes (Addendum)
Patient ID: Kristina Morris, female   DOB: [birth year], 50 y.o.   MRN: [id number] S:Intubated, mildly responsive O:BP (!) 150/66   Pulse (!) 111   Temp 98 F (36.7 C) (Oral)   Resp 19   Ht 5\' 6"  (1.676 m)   Wt 63.6 kg (140 lb 3.4 oz)   SpO2 100%   BMI 22.63 kg/m   Intake/Output Summary (Last 24 hours) at 01/26/16 0724 Last data filed at 01/26/16 0700  Gross per 24 hour  Intake             1315 ml  Output             5396 ml  Net            -4081 ml   Intake/Output: I/O last 3 completed shifts: In: 1945 [I.V.:330; Other:170; NG/GT:1145; IV Piggyback:300] Out: 7760 [Other:6500; Stool:1260]  Intake/Output this shift:  No intake/output data recorded. Weight change: -5.5 kg (-12 lb 2 oz) Gen:WD WN WM intubated WUJ:WJXBJ Resp:occ rhonchi YNW:GNFAOZ Ext:tr edema   Recent Labs Lab 01/23/16 0415 01/23/16 1600 01/24/16 0339 01/24/16 1545 01/25/16 0435 01/25/16 1600 01/26/16 0535  NA 137  136 138 137  138 138 139 138 140  K 4.1  4.1 4.0 4.0  4.1 4.2 4.5 4.2 3.8  CL 104  103 106 104  104 105 106 105 105  CO2 26  26 27 28  28 27 29 29 28   GLUCOSE 192*  192* 229* 190*  192* 194* 176* 179* 147*  BUN 36*  37* 36* 37*  36* 39* 39* 42* 43*  CREATININE 1.40*  1.37* 1.22* 1.14*  1.14* 1.10* 0.94 1.05* 1.08*  ALBUMIN 2.9* 2.6* 2.8* 2.7* 2.8* 2.9* 3.0*  CALCIUM 8.7*  8.6* 8.5* 8.6*  8.7* 8.7* 9.1 9.3 9.6  PHOS 2.2* 2.2* 2.6 2.7 3.2 3.5 3.3   Liver Function Tests:  Recent Labs Lab 01/25/16 0435 01/25/16 1600 01/26/16 0535  ALBUMIN 2.8* 2.9* 3.0*   No results for input(s): LIPASE, AMYLASE in the last 168 hours.  Recent Labs Lab 01/25/16 0435  AMMONIA 112*   CBC:  Recent Labs Lab 01/21/16 0433 01/22/16 0900 01/23/16 0415 01/24/16 0339 01/24/16 0922 01/25/16 0435 01/26/16 0535  WBC 19.1* 15.3* 14.0* 10.0  --  12.9* 16.0*  NEUTROABS 16.7*  --  11.4* 8.2*  --   --   --   HGB 6.7* 7.2* 8.0* 8.0*  --  8.6* 9.1*  HCT 20.3* 21.1* 23.4* 23.5*  --  25.9*  27.6*  MCV 107.4* 100.5* 100.9* 100.4*  --  104.4* 106.2*  PLT 55* 54* 53* 36* 38* 54* 59*   Cardiac Enzymes:  Recent Labs Lab 01/19/16 0923 01/19/16 1555  TROPONINI <0.03 <0.03   CBG:  Recent Labs Lab 01/25/16 1217 01/25/16 1539 01/25/16 1929 01/25/16 2340 01/26/16 0413  GLUCAP 195* 154* 150* 196* 164*    Iron Studies: No results for input(s): IRON, TIBC, TRANSFERRIN, FERRITIN in the last 72 hours. Studies/Results: Dg Chest Port 1 View  Result Date: 01/25/2016 CLINICAL DATA:  ARDS, intubated patient EXAM: PORTABLE CHEST 1 VIEW COMPARISON:  January 24, 2016 portable chest x-ray FINDINGS: The lungs are reasonably well inflated. The interstitial markings remain increased. The left hemidiaphragm is better demonstrated today. The cardiac silhouette is top-normal in size. The central pulmonary vascularity is prominent but more distinct today. The endotracheal tube tip lies 3 cm above the carina. The esophagogastric tube tip projects below the inferior margin of the image. The left  internal jugular venous catheter tip projects over the distal SVC. IMPRESSION: Mild interval improvement in the appearance of the pulmonary interstitium may reflect decreased interstitial edema or pneumonia. Persistent bibasilar infiltrates or atelectasis. Mild central pulmonary vascular prominence. The support tubes are in stable position. Electronically Signed   By: David  SwazilandJordan M.D.   On: 01/25/2016 07:21   . chlorhexidine  15 mL Mouth Rinse BID  . famotidine  20 mg Oral BID  . feeding supplement (JEVITY 1.5 CAL/FIBER)  720 mL Per Tube Q24H  . feeding supplement (PRO-STAT SUGAR FREE 64)  60 mL Per Tube BID  . folic acid  1 mg Intravenous Daily  . insulin aspart  0-9 Units Subcutaneous Q4H  . lactulose  20 g Per Tube TID  . mouth rinse  15 mL Mouth Rinse QID  . meropenem (MERREM) IV  500 mg Intravenous Q8H  . multivitamin  15 mL Oral Daily  . sodium chloride flush  10-40 mL Intracatheter Q12H  .  thiamine injection  100 mg Intravenous Daily    BMET    Component Value Date/Time   NA 140 01/26/2016 0535   K 3.8 01/26/2016 0535   CL 105 01/26/2016 0535   CO2 28 01/26/2016 0535   GLUCOSE 147 (H) 01/26/2016 0535   BUN 43 (H) 01/26/2016 0535   CREATININE 1.08 (H) 01/26/2016 0535   CALCIUM 9.6 01/26/2016 0535   GFRNONAA 59 (L) 01/26/2016 0535   GFRAA >60 01/26/2016 0535   CBC    Component Value Date/Time   WBC 16.0 (H) 01/26/2016 0535   RBC 2.60 (L) 01/26/2016 0535   HGB 9.1 (L) 01/26/2016 0535   HCT 27.6 (L) 01/26/2016 0535   PLT 59 (L) 01/26/2016 0535   MCV 106.2 (H) 01/26/2016 0535   MCH 35.0 (H) 01/26/2016 0535   MCHC 33.0 01/26/2016 0535   RDW 24.7 (H) 01/26/2016 0535   LYMPHSABS 0.9 01/24/2016 0339   MONOABS 0.9 01/24/2016 0339   EOSABS 0.0 01/24/2016 0339   BASOSABS 0.0 01/24/2016 0339     Assessment/Plan:  1. AKI in setting of PNA and SIRS as well as hyperbilirubinemia (due to cirrhosis) on CVVHD since 01/21/16 mainly for volume removal.  Initially non-oliguric, now anuric.  1. Will decrease UF to net neg 6650ml/hr as her volume status has improved and had marked UF over the last 24 hours (4liters) and would like to ease up to minimize worsening ATN 2. [facility] will require Intermittent HD 3. No evidence of renal recovery at this time. 4. Concern over pigment induced ATN as well and will recheck t. Bili  2. VDRF due to aspiration vs. CAP/ARDS- UF with CVVHD. Per PCCM 3. Alcoholic cirrhosis- with elevated AST/ALT/tbili/ammonia.  Not a transplant candidate due to recent alcohol use. 4. Thrombocytopenia- presumably related to her cirrhosis 5. SIRS- off pressors  Julien NordmannJoseph A Gayatri Teasdale

## 2016-01-26 NOTE — Progress Notes (Signed)
Wasted 1 mL of versed in sharps container.  Rutha BouchardShelby Odom RN wintessed my waste. Melford AaseSusan J Frazier Balfour RN

## 2016-01-26 NOTE — Progress Notes (Addendum)
PULMONARY / CRITICAL CARE MEDICINE   Name: Kristina Morris MRN: [id number] DOB: [birth year]    ADMISSION DATE:  01-26-16 CONSULTATION DATE:  01/19/16  REFERRING MD:  Cena Benton - TR[facility]  CHIEF COMPLAINT:  AMS, increased WOB  BRIEF:  50 y/o female with EtOH cirrhosis admitted on 9/2 with CAP, aspirated on 9/6 requiring intubation.  Subsequently, developed ARDS, multi-organ failure and AKI on CRRT.   SUBJECTIVE:   Did better on SBT today AM but still borderline trials.  VITAL SIGNS: BP (!) 143/65   Pulse (!) 107   Temp 98 F (36.7 C) (Oral)   Resp (!) 23   Ht 5\' 6"  (1.676 m)   Wt 140 lb 3.4 oz (63.6 kg)   SpO2 98%   BMI 22.63 kg/m   HEMODYNAMICS:    VENTILATOR SETTINGS: Vent Mode: PRVC FiO2 (%):  [40 %] 40 % Set Rate:  [15 bmp] 15 bmp Vt Set:  [400 mL] 400 mL PEEP:  [5 cmH20] 5 cmH20 Plateau Pressure:  [12 cmH20-15 cmH20] 12 cmH20  INTAKE / OUTPUT: I/O last 3 completed shifts: In: 1945 [I.V.:330; Other:170; NG/GT:1145; IV Piggyback:300] Out: 7760 [Other:6500; Stool:1260]   PHYSICAL EXAMINATION: General: chronically ill appearing on vent, jaundice HENT: Scleral icterus, ETT in place Neuro: altered, open eyes to voice, looks at provider when speaking briefly, wiggles toes to command CV: RRR, No MRG PULM: Clear, no wheeze or crackles.  GI: distended abdomen but soft, +BS Derm: Trace edema  LABS:  BMET  Recent Labs Lab 01/25/16 0435 01/25/16 1600 01/26/16 0535  NA 139 138 140  K 4.5 4.2 3.8  CL 106 105 105  CO2 29 29 28   BUN 39* 42* 43*  CREATININE 0.94 1.05* 1.08*  GLUCOSE 176* 179* 147*    Electrolytes  Recent Labs Lab 01/24/16 0339  01/25/16 0435 01/25/16 1600 01/26/16 0535  CALCIUM 8.6*  8.7*  < > 9.1 9.3 9.6  MG 2.5*  --  2.6*  --  2.5*  PHOS 2.6  < > 3.2 3.5 3.3  < > = values in this interval not displayed.  CBC  Recent Labs Lab 01/24/16 0339 01/24/16 0922 01/25/16 0435 01/26/16 0535  WBC 10.0  --  12.9* 16.0*  HGB 8.0*  --  8.6* 9.1*   HCT 23.5*  --  25.9* 27.6*  PLT 36* 38* 54* 59*    Coag's  Recent Labs Lab 01/19/16 1709  01/24/16 0922 01/25/16 0435 01/26/16 0535  APTT 45*  < > 62* >200* 39*  INR 2.67  --  2.25  --   --   < > = values in this interval not displayed.  Sepsis Markers  Recent Labs Lab 01/19/16 1234 01/25/16 0435 01/26/16 0535  LATICACIDVEN 1.3  --   --   PROCALCITON  --  0.65 0.70    ABG  Recent Labs Lab 01/22/16 0855 01/23/16 0440 01/23/16 1525  PHART 7.336* 7.382 7.352  PCO2ART 45.5 44.0 48.7*  PO2ART 135* 80.9* 90.4    Liver Enzymes  Recent Labs Lab 01/25/16 1600 01/26/16 0535 01/26/16 0927  AST  --   --  69*  ALT  --   --  48  ALKPHOS  --   --  198*  BILITOT  --   --  10.9*  ALBUMIN 2.9* 3.0* 3.1*    Cardiac Enzymes  Recent Labs Lab 01/19/16 1555  TROPONINI <0.03    Glucose  Recent Labs Lab 01/25/16 1217 01/25/16 1539 01/25/16 1929 01/25/16 2340 01/26/16 0413 01/26/16  0830  GLUCAP 195* 154* 150* 196* 164* 129*    Imaging 9/10 CXR images reviewed: bilateral interstitial infiltrates again noted with air space disease, improving    STUDIES:  CXR 9/5 >> multifocal PNA ECHO 9/7 >>EF 65-70% no wall motion abnormalities. PAS estimated at 32 mmHg peak.   CULTURES: Blood 9/2 >> neg Urine 9/2 >> neg Sputum 9/6 >> normal flora Blood 9/6 >> neg  ANTIBIOTICS: Cefepime 9/2 >> 9/7 Flagyl 9/5 >> 9/7 Meropenem 9/7 >> 9/13 Vanc 9/7 >> 9/9  SIGNIFICANT EVENTS: 9/02  Admit 9/06  PCCM called, pt intubated due to increased WOB 9/07  worsening hypoxia/ARDS, started on NMB- also progressive shock/MODS 9/08  HD cath placed. Start CRRT, off vasopressors temporarily 9/11  Off vasopressors, volume status improved, filter clotted  9/12  More alert, failed SBT.   LINES/TUBES: ETT 9/6 > L IJ CVL 9/7 >  R groin HD cath 9/9 >  R Rad Aline >>  9/12  DISCUSSION: 50 y.o. female with ETOH cirrhosis, admitted 9/2 with HCAP. Had deterioration 9/5 after  aspiration event and required intubation early AM hours 9/6 due to increased WOB and hypoxia. Has ARDS, multi-organ failure, started CVVHD 9/8. Overall condition improving 9/10.  ASSESSMENT / PLAN:  PULMONARY A: ARDS - oxygenation improving dramatically with volume removal Aspiration PNA - improved Acute pulm edema from cirrhosis, renal failure > improving P:   PRVC 8 cc/kg  Wean PEEP / FiO2 for sats >92% VAP prevention measures Intermittent CXR Daily WUA / SBT Volume removal w/ CRRT. Reduced to neg 50cc/hr.   CARDIOVASCULAR A:  SVT 01/20/2016 AM - likely levophed related Septic shock - 01/20/2016, improving, complicated by cirrhosis Hx HLD P:  Monitor hemodynamics and tele Monitor off vasopressors / stress dose steroids  Hold preadmission furosemide, spironolactone  NEUROLOGIC A:   Acute encephalopathy: multifactorial in setting of hepatic encephalopathy and respiratory failure Hx depression, ETOH use (reportedly now abstinent) P:   RASS goal 0 Daily WUA  Minimize sedation as able  Continue folic acid, thiamine Hold preadmission cyclobenzaprine, sertraline, hydroxyzine PAD protocol with PRN versed at reduced dose / fentanyl only  GASTROINTESTINAL A:   Hepatic encephalopathy - rising ammonia 9/12 Hx ETOH cirrhosis, fatty liver, ascites Protein calorie malnutrition GI prophylaxis Nutrition P:   Reduce lactulose to bid as she has diarrhea Add rifaximin for hepatic encephalopathy Continue tube feedings Pepcid for stress ulcer prophylaxis  RENAL A:   AKI - started CVVHD 9/8 Hypophosphatemia P:   Continue CVVHD. May need transfer to [facility] for intermittent HD. Volume remove as able, tolerating negative balance thus far Monitor UOP  HEMATOLOGIC A:   Anemia - chronic without bleeding, 1 unit PRBC 9/8 Thrombocytopenia - chronic VTE Prophylaxis P:  Transfuse for Hgb < 7   No evidence of DIC > PT 25, INR 2.25, PTT 62, Fibrinogen 266, D-Dimer 17.45 Cont SCD's Trend  CBC   INFECTIOUS A:   Aspiration PNA - improved 9/12 Septic shock Elevated WBC count today but no evidence of new infection. Pct is still low P:   D/C meropenem Check C diff Recheck bcx. Consider replacement of groin HD cath.   ENDOCRINE A:   Hyperglycemia  P:   SSI  Family updated:  Husband Raiford Noble) updated at bedside am 9/13.     Interdisciplinary Family Meeting v Palliative Care Meeting:  Due by: 9/13.  Critical care time- 35 mins.  Chilton Greathouse MD [facility] Pager [phone] If no answer or after  3pm call: [phone] 01/26/2016, 10:43 AM

## 2016-01-26 NOTE — Progress Notes (Signed)
eLink Physician-Brief Progress Note Patient Name: Roque CashLaura J Baskett DOB: Jan 29, 1966 MRN: [id number]   Date of Service  01/26/2016  HPI/Events of Note  Agitation on the vent, double triggering, tachycardia  eICU Interventions  Start fentanyl gtt PRN versed     Intervention Category Major Interventions: Respiratory failure - evaluation and management  Nehemias Sauceda 01/26/2016, 11:42 PM

## 2016-01-27 ENCOUNTER — Encounter (HOSPITAL_COMMUNITY): Payer: Self-pay | Admitting: Gastroenterology

## 2016-01-27 ENCOUNTER — Inpatient Hospital Stay (HOSPITAL_COMMUNITY): Payer: 59

## 2016-01-27 DIAGNOSIS — K7682 Hepatic encephalopathy: Secondary | ICD-10-CM

## 2016-01-27 DIAGNOSIS — K7290 Hepatic failure, unspecified without coma: Secondary | ICD-10-CM

## 2016-01-27 LAB — CBC
HCT: 25 % — ABNORMAL LOW (ref 36.0–46.0)
Hemoglobin: 7.9 g/dL — ABNORMAL LOW (ref 12.0–15.0)
MCH: 34.1 pg — ABNORMAL HIGH (ref 26.0–34.0)
MCHC: 31.6 g/dL (ref 30.0–36.0)
MCV: 107.8 fL — ABNORMAL HIGH (ref 78.0–100.0)
PLATELETS: 52 10*3/uL — AB (ref 150–400)
RBC: 2.32 MIL/uL — ABNORMAL LOW (ref 3.87–5.11)
RDW: 24.9 % — AB (ref 11.5–15.5)
WBC: 14.8 10*3/uL — AB (ref 4.0–10.5)

## 2016-01-27 LAB — GLUCOSE, CAPILLARY
GLUCOSE-CAPILLARY: 133 mg/dL — AB (ref 65–99)
GLUCOSE-CAPILLARY: 141 mg/dL — AB (ref 65–99)
Glucose-Capillary: 137 mg/dL — ABNORMAL HIGH (ref 65–99)
Glucose-Capillary: 157 mg/dL — ABNORMAL HIGH (ref 65–99)
Glucose-Capillary: 169 mg/dL — ABNORMAL HIGH (ref 65–99)
Glucose-Capillary: 178 mg/dL — ABNORMAL HIGH (ref 65–99)

## 2016-01-27 LAB — RENAL FUNCTION PANEL
ALBUMIN: 2.4 g/dL — AB (ref 3.5–5.0)
ANION GAP: 6 (ref 5–15)
BUN: 67 mg/dL — ABNORMAL HIGH (ref 6–20)
CALCIUM: 10.1 mg/dL (ref 8.9–10.3)
CO2: 27 mmol/L (ref 22–32)
CREATININE: 2.22 mg/dL — AB (ref 0.44–1.00)
Chloride: 107 mmol/L (ref 101–111)
GFR calc non Af Amer: 25 mL/min — ABNORMAL LOW (ref >60)
GFR, EST AFRICAN AMERICAN: 29 mL/min — AB (ref >60)
Glucose, Bld: 150 mg/dL — ABNORMAL HIGH (ref 65–99)
PHOSPHORUS: 6.2 mg/dL — AB (ref 2.5–4.6)
Potassium: 4.3 mmol/L (ref 3.5–5.1)
Sodium: 140 mmol/L (ref 135–145)

## 2016-01-27 LAB — MAGNESIUM: MAGNESIUM: 2.8 mg/dL — AB (ref 1.7–2.4)

## 2016-01-27 LAB — POCT I-STAT 3, ART BLOOD GAS (G3+)
ACID-BASE EXCESS: 6 mmol/L — AB (ref 0.0–2.0)
BICARBONATE: 30 mmol/L — AB (ref 20.0–28.0)
O2 SAT: 95 %
PO2 ART: 71 mmHg — AB (ref 83.0–108.0)
Patient temperature: 98.6
TCO2: 31 mmol/L (ref 0–100)
pCO2 arterial: 40.3 mmHg (ref 32.0–48.0)
pH, Arterial: 7.48 — ABNORMAL HIGH (ref 7.350–7.450)

## 2016-01-27 LAB — PROCALCITONIN: PROCALCITONIN: 1.52 ng/mL

## 2016-01-27 LAB — AMMONIA: Ammonia: 40 umol/L — ABNORMAL HIGH (ref 9–35)

## 2016-01-27 MED ORDER — NEPRO/CARBSTEADY PO LIQD
1000.0000 mL | ORAL | Status: DC
  Administered 2016-01-27 – 2016-01-29 (×3): 1000 mL
  Filled 2016-01-27 (×5): qty 1000

## 2016-01-27 MED ORDER — MIDAZOLAM HCL 2 MG/2ML IJ SOLN
1.0000 mg | INTRAMUSCULAR | Status: DC | PRN
  Administered 2016-01-27 (×2): 1 mg via INTRAVENOUS
  Filled 2016-01-27 (×2): qty 2

## 2016-01-27 MED ORDER — FAMOTIDINE 20 MG PO TABS
20.0000 mg | ORAL_TABLET | Freq: Every day | ORAL | Status: DC
  Administered 2016-01-28 – 2016-01-30 (×3): 20 mg via ORAL
  Filled 2016-01-27 (×3): qty 1

## 2016-01-27 NOTE — Progress Notes (Signed)
eLink Physician-Brief Progress Note Patient Name: Kristina CashLaura J Morris DOB: 05-17-1965 MRN: [id number]   Date of Service  01/27/2016  HPI/Events of Note  Reviewed GI consult note.   eICU Interventions  1. Ordered CVP every shift 2. Ordered urine electrolytes to calculate FENa     Intervention Category Intermediate Interventions: Other:  Lawanda CousinsJennings Calyssa Zobrist 01/27/2016, 10:34 PM

## 2016-01-27 NOTE — Progress Notes (Signed)
[facility] Progress Note   Assessment/ Plan:   1. AKI in setting of PNA and SIRS as well as hyperbilirubinemia (due to cirrhosis) on CVVHD from 01/21/16 to 01/26/16 mainly for volume removal.  Initially non-oliguric, now anuric. With her volume status yesterday and permissive blood pressures, she was transferred to [facility] for the possibility that she may be able to tolerate intermittent hemodialysis treatments. There is however concerns that her femoral dialysis catheter needs to be taken out with a catheter holiday because of persistent leukocytosis. A question is also raised regarding the etiology of her acute renal failure and the possibility that it might reflect hepatorenal syndrome. If this is the case, dialysis without the endpoint of liver transplant would be an exercise in futility. At this time, she does not have any acute indications for dialysis. 2. VDRF due to aspiration vs. CAP/ARDS- previously on UF with CVVHD. Continue ventilator management per CCM 3. Alcoholic cirrhosis- with elevated AST/ALT/tbili/ammonia.  Not a liver transplant candidate due to recent alcohol use. 4. Thrombocytopenia- presumably related to her cirrhosis/chronic alcoholism. No bleeding diathesis noted. 5. SIRS- off pressors with intermittently depressed blood pressures-  Subjective:   Transferred from [facility] to [facility] overnight for the possibility of transitioning to intermittent hemodialysis.    Objective:   BP (!) 116/53   Pulse (!) 125   Temp 99.1 F (37.3 C) (Oral)   Resp (!) 26   Ht 5\' 6"  (1.676 m)   Wt 62.6 kg (138 lb 0.1 oz)   SpO2 98%   BMI 22.28 kg/m   Intake/Output Summary (Last 24 hours) at 01/27/16 1156 Last data filed at 01/27/16 1000  Gross per 24 hour  Intake          1025.67 ml  Output             1269 ml  Net          -243.33 ml   Weight change: -1.4 kg (-3 lb 1.4 oz)  Physical Exam: [id number], sedated, appears comfortable CVS: Pulse regular  tachycardia, S1 and S2 normal Resp: Coarse/mechanical breath sounds bilaterally Abd: Soft, slightly distended, nontender Ext: 1+ Edema noted in dependent areas, trace edema over ankles  Imaging: Dg Chest Port 1 View  Result Date: 01/27/2016 CLINICAL DATA:  Acute respiratory failure common pneumonia, cirrhosis. EXAM: PORTABLE CHEST 1 VIEW COMPARISON:  Portable chest x-ray of January 25, 2016 FINDINGS: The lungs are adequately inflated. The interstitial markings remain coarse. There is patchy density at the left lung base which is less conspicuous today. There is better visualization of the left hemidiaphragm. The heart is top-normal in size. The pulmonary vascularity is less engorged and more distinct. The endotracheal tube tip lies approximately 4.8 cm above the carina. Advancement by 2 cm is recommended. The esophagogastric tube tip projects below the inferior margin of the image. The right internal jugular venous catheter tip projects at the junction of the right and left brachiocephalic veins with the tip pointing cephalad. IMPRESSION: 1. Mild improvement in pulmonary interstitial edema though some edema remains. Decreased pulmonary vascular congestion with decreased left basilar atelectasis. No large pleural effusion. 2. Advancement of the nasogastric tube by 2 cm is recommended. There has been interval partial withdrawal of the left internal jugular catheter with its tip pointing cephalad and lying at the junction of the right and left brachiocephalic veins. Electronically Signed   By: David  [place] M.D.   On: 01/27/2016 07:26    Labs: BMET  Recent  Labs Lab 01/24/16 0339 01/24/16 1545 01/25/16 0435 01/25/16 1600 01/26/16 0535 01/26/16 1600 01/27/16 0448  NA 137  138 138 139 138 140 140 140  K 4.0  4.1 4.2 4.5 4.2 3.8 3.9 4.3  CL 104  104 105 106 105 105 107 107  CO2 28  28 27 29 29 28 26 27   GLUCOSE 190*  192* 194* 176* 179* 147* 189* 150*  BUN 37*  36* 39* 39* 42* 43* 48*  67*  CREATININE 1.14*  1.14* 1.10* 0.94 1.05* 1.08* 1.28* 2.22*  CALCIUM 8.6*  8.7* 8.7* 9.1 9.3 9.6 9.8 10.1  PHOS 2.6 2.7 3.2 3.5 3.3 4.0 6.2*   CBC  Recent Labs Lab 01/21/16 0433  01/23/16 0415 01/24/16 0339 01/24/16 0922 01/25/16 0435 01/26/16 0535 01/27/16 0447  WBC 19.1*  < > 14.0* 10.0  --  12.9* 16.0* 14.8*  NEUTROABS 16.7*  --  11.4* 8.2*  --   --   --   --   HGB 6.7*  < > 8.0* 8.0*  --  8.6* 9.1* 7.9*  HCT 20.3*  < > 23.4* 23.5*  --  25.9* 27.6* 25.0*  MCV 107.4*  < > 100.9* 100.4*  --  104.4* 106.2* 107.8*  PLT 55*  < > 53* 36* 38* 54* 59* 52*  < > = values in this interval not displayed.  Medications:    . chlorhexidine  15 mL Mouth Rinse BID  . [START ON 01/28/2016] famotidine  20 mg Oral Daily  . feeding supplement (JEVITY 1.5 CAL/FIBER)  720 mL Per Tube Q24H  . feeding supplement (PRO-STAT SUGAR FREE 64)  60 mL Per Tube BID  . folic acid  1 mg Intravenous Daily  . insulin aspart  0-9 Units Subcutaneous Q4H  . lactulose  20 g Per Tube BID  . mouth rinse  15 mL Mouth Rinse QID  . multivitamin  15 mL Oral Daily  . rifaximin  550 mg Oral BID  . sodium chloride flush  10-40 mL Intracatheter Q12H  . thiamine injection  100 mg Intravenous Daily   Zetta BillsJay Augusten Lipkin, MD 01/27/2016, 11:56 AM

## 2016-01-27 NOTE — Progress Notes (Signed)
   [facility] PCCM  Pt has [facility]. Last OV was 12/13/15.  He had concerns about pt not being forthcoming with her alcohol drinking.  She said she stopped drinking in 06/2015.  There was an elevated ethyl alcohol level of 12 in 10/27/2015.   Husband is not sure if pt is still drinking ETOH.   I spoke with Dr. Allena KatzPatel (Renal) today as well as with GI today.  Renal said they will not dialyze pt as she has hepatorenal dse and we need to address her liver issues.  I have spoken to GI re: seeing pt and asking for recommendations.    Ideally, pt will need to be evaluated for liver transplant but as pt is likely still drinking ETOH, then LT will not be considered at this point. I would assume pt has to be abstient for at least 6 mos and be actively involved with alcohol rehab programs before she is considered for LT.   I will await GI's recommendation and discuss with husband re: goals of care. If no HD or CRRT is to be done, then we need to make pt at least a DNR. Need to talk to family.  Husband wanted to speak to renal MD >> we mentioned to Renal MD this pm.   Pollie MeyerJ. Angelo A de Dios, MD 01/27/2016, 2:56 PM [facility] Pager [phone] After 3 pm or if no answer, call [phone]

## 2016-01-27 NOTE — Progress Notes (Signed)
PULMONARY / CRITICAL CARE MEDICINE   Name: Kristina CashLaura J Flanary MRN: [id number] DOB: [birth year]    ADMISSION DATE:  02/01/2016 CONSULTATION DATE:  01/19/16  REFERRING MD:  Cena BentonVega - TRH  CHIEF COMPLAINT:  AMS, increased WOB  BRIEF:  50 y/o female with EtOH cirrhosis admitted on 9/2 with CAP, aspirated on 9/6 requiring intubation.  Subsequently, developed ARDS, multi-organ failure and AKI on CRRT.  [facility]  SUBJECTIVE:    Agitation last night. Asynchronous with the vent.   VITAL SIGNS: BP (!) 116/53   Pulse (!) 125   Temp 99.1 F (37.3 C) (Oral)   Resp (!) 26   Ht 5\' 6"  (1.676 m)   Wt 62.6 kg (138 lb 0.1 oz)   SpO2 98%   BMI 22.28 kg/m   HEMODYNAMICS:    VENTILATOR SETTINGS: Vent Mode: PSV;CPAP FiO2 (%):  [30 %-40 %] 30 % Set Rate:  [15 bmp] 15 bmp Vt Set:  [400 mL] 400 mL PEEP:  [5 cmH20] 5 cmH20 Pressure Support:  [10 cmH20] 10 cmH20 Plateau Pressure:  [12 cmH20-20 cmH20] 12 cmH20  INTAKE / OUTPUT: I/O last 3 completed shifts: In: 1553.2 [I.V.:428.2; Other:290; NG/GT:735; IV Piggyback:100] Out: 4108 [Other:3158; Stool:950]   PHYSICAL EXAMINATION: General: chronically ill appearing on vent, jaundice HENT: Scleral icterus, ETT in place Neuro: altered, open eyes to voice. (-) lateralizing signs CV: RRR, No MRG PULM: good ae. Crackles bibasilar. GI: distended abdomen but soft, +BS Derm: Trace edema  LABS:  BMET  Recent Labs Lab 01/26/16 0535 01/26/16 1600 01/27/16 0448  NA 140 140 140  K 3.8 3.9 4.3  CL 105 107 107  CO2 28 26 27   BUN 43* 48* 67*  CREATININE 1.08* 1.28* 2.22*  GLUCOSE 147* 189* 150*    Electrolytes  Recent Labs Lab 01/25/16 0435  01/26/16 0535 01/26/16 1600 01/27/16 0447 01/27/16 0448  CALCIUM 9.1  < > 9.6 9.8  --  10.1  MG 2.6*  --  2.5*  --  2.8*  --   PHOS 3.2  < > 3.3 4.0  --  6.2*  < > = values in this interval not displayed.  CBC  Recent Labs Lab 01/25/16 0435 01/26/16 0535  01/27/16 0447  WBC 12.9* 16.0* 14.8*  HGB 8.6* 9.1* 7.9*  HCT 25.9* 27.6* 25.0*  PLT 54* 59* 52*    Coag's  Recent Labs Lab 01/24/16 0922 01/25/16 0435 01/26/16 0535  APTT 62* >200* 39*  INR 2.25  --   --     Sepsis Markers  Recent Labs Lab 01/25/16 0435 01/26/16 0535 01/27/16 0447  PROCALCITON 0.65 0.70 1.52    ABG  Recent Labs Lab 01/23/16 0440 01/23/16 1525 01/27/16 0422  PHART 7.382 7.352 7.480*  PCO2ART 44.0 48.7* 40.3  PO2ART 80.9* 90.4 71.0*    Liver Enzymes  Recent Labs Lab 01/26/16 0927 01/26/16 1600 01/27/16 0448  AST 69*  --   --   ALT 48  --   --   ALKPHOS 198*  --   --   BILITOT 10.9*  --   --   ALBUMIN 3.1* 2.8* 2.4*    Cardiac Enzymes No results for input(s): TROPONINI, PROBNP in the last 168 hours.  Glucose  Recent Labs Lab 01/26/16 1200 01/26/16 1646 01/26/16 1950 01/26/16 2334 01/27/16 0336 01/27/16 0826  GLUCAP 181* 162* 127* 131* 137* 169*    Imaging 9/10 CXR images reviewed: bilateral interstitial infiltrates again noted with air space disease,  improving    STUDIES:  CXR 9/5 >> multifocal PNA ECHO 9/7 >>EF 65-70% no wall motion abnormalities. PAS estimated at 32 mmHg peak.   CULTURES: Blood 9/2 >> neg Urine 9/2 >> neg Sputum 9/6 >> normal flora Blood 9/6 >> neg Blood 9/13 > C diff 9/13 > (-)  ANTIBIOTICS: Cefepime 9/2 >> 9/7 Flagyl 9/5 >> 9/7 Meropenem 9/7 >> 9/13 Vanc 9/7 >> 9/9  SIGNIFICANT EVENTS: 9/02  Admit 9/06  PCCM called, pt intubated due to increased WOB 9/07  worsening hypoxia/ARDS, started on NMB- also progressive shock/MODS 9/08  HD cath placed. Start CRRT, off vasopressors temporarily 9/11  Off vasopressors, volume status improved, filter clotted  9/12  More alert, failed SBT.  9/13 transferred to [facility] for [facility] HD  LINES/TUBES: ETT 9/6 > L IJ CVL 9/7 >  R groin HD cath 9/9 >  R Rad Aline >>  9/12  DISCUSSION: 50 y.o. female with ETOH cirrhosis, admitted 9/2 with HCAP.  Had deterioration 9/5 after aspiration event and required intubation early AM hours 9/6 due to increased WOB and hypoxia. Has ARDS, multi-organ failure, started CVVHD 9/8. Overall condition improving 9/10.  ASSESSMENT / PLAN:  PULMONARY A: ARDS - oxygenation improving dramatically with volume removal Aspiration PNA - improved Acute pulm edema from cirrhosis, renal failure > improving P:   PRVC 8 cc/kg  Wean PEEP / FiO2 for sats >92% VAP prevention measures Daily WUA / SBT  >> not ready today. Was tachypneic on PST   CARDIOVASCULAR A:  SVT 01/20/2016 AM - likely levophed related Septic shock - 01/20/2016. Resolved.  Hx HLD P:  Monitor hemodynamics and tele Monitor off vasopressors / stress dose steroids  Hold preadmission furosemide, spironolactone  NEUROLOGIC A:   Acute encephalopathy: multifactorial in setting of hepatic encephalopathy and respiratory failure Hx depression, ETOH use (reportedly now abstinent since 06/2015) P:   RASS goal 0 Daily WUA  Minimize sedation as able. Fentanyl drip, versed prn.  Continue folic acid, thiamine Hold preadmission cyclobenzaprine, sertraline, hydroxyzine PAD protocol with PRN versed at reduced dose / fentanyl only  GASTROINTESTINAL A:   Hepatic encephalopathy - rising ammonia 9/12 Hx ETOH cirrhosis, fatty liver, ascites Protein calorie malnutrition GI prophylaxis Nutrition P:   Continuous BM cont lactulose BID > having diarrhea but better > check ammonia now > if better, cut down dose Add rifaximin for hepatic encephalopathy Continue tube feedings Pepcid for stress ulcer prophylaxis Will consult GI. 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 Not usre if HRS vs alcoholic hepatitis.  Not sure re: role of steroids.   RENAL A:   AKI - started CVVHD 9/8 and dc'd on 9/13 Hypophosphatemia P:   Renal says no indication for HD now.  Pt likely has HRS. May need to address underlying liver issues. Will call GI. Pt is anuric.  Has a R femoral HD  cath > looks clean > will call IR if HD will be done.   HEMATOLOGIC A:   Anemia - chronic without bleeding, 1 unit PRBC 9/8 Thrombocytopenia - chronic VTE Prophylaxis P:  Transfuse for Hgb < 7   No evidence of DIC > PT 25, INR 2.25, PTT 62, Fibrinogen 266, D-Dimer 17.45 Cont SCD's Trend CBC   INFECTIOUS A:   Aspiration PNA - improved 9/12 Septic shock. Resolved.  Elevated WBC count today but no evidence of new infection. Pct is still low P:   Will observe off abx. She got 2 weeks of broad spectrum abx, finished 9/13.  ENDOCRINE A:   Hyperglycemia  P:   SSI  Family updated:  Husband Raiford Noble) updated at bedside am 9/14.     Interdisciplinary Family Meeting v Palliative Care Meeting:  Due by: 9/13.  Critical care time- 30 mins.  Pollie Meyer, MD 01/27/2016, 11:54 AM [facility] Pager [phone] After 3 pm or if no answer, call [phone]

## 2016-01-27 NOTE — Consult Note (Signed)
Reason for Consult: Liver failure Referring Physician: ICU team  Kristina Morris is an 50 y.o. female.  HPI: Patient seen and examined and chart reviewed and I had a very long conversation with the ICU team and she was seen by my partner in June and has seen other GIs at a outlying hospital but I cannot tell if it was [facility] or [facility] and she was discharged on prednisone in June for severe alcoholic hepatitis but I am unsure whether she took it as an outpatient and currently she is sedated or encephalopathic and there is a question of hepatorenal but in June she did have a positive ethyl alcohol level and there is a question of whether she stop drinking then or in February  Past Medical History:  Diagnosis Date  . Alcoholic ([place])   . Chicken pox   . Cirrhosis ([place])   . Depression   . Fatty liver   . Hyperlipidemia   . Mitral valve prolapse   . Seasonal allergies     Past Surgical History:  Procedure Laterality Date  . cysts removal, sinus    . OVARIAN CYST REMOVAL    . TONSILLECTOMY AND ADENOIDECTOMY    . 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 Liver disease Brother     older brother  . Depression Mother   . Breast cancer Mother   . Osteoporosis Mother   . Liver disease Maternal Aunt   . Liver disease Paternal Aunt   . Alcoholism      both sides of family  . Diabetes Brother   . Hypertension Brother     older brother  . Cancer      both sides of family  . Heart disease Father   . Heart disease Maternal Grandmother     Social History:  reports that she has been smoking Cigarettes.  She has never used smokeless tobacco. She reports that she drinks alcohol. She reports that she does not use drugs.  Allergies:  Allergies  Allergen Reactions  . Avelox [Moxifloxacin Hcl In Nacl] Shortness Of Breath, Itching and Rash    GI Upset  . Bee Venom Anaphylaxis  . Doxycycline Nausea Only and Rash  . Amoxicillin Rash    Upset stomach   .  Erythromycin Rash    Upset stomach  . Sulfa Antibiotics Rash    Upset stomach    Medications: I have reviewed the patient's current medications.  Results for orders placed or performed during the hospital encounter of 01/22/2016 (from the past 48 hour(s))  Glucose, capillary     Status: Abnormal   Collection Time: 01/25/16  7:29 PM  Result Value Ref Range   Glucose-Capillary 150 (H) 65 - 99 mg/dL  Glucose, capillary     Status: Abnormal   Collection Time: 01/25/16 11:40 PM  Result Value Ref Range   Glucose-Capillary 196 (H) 65 - 99 mg/dL  Glucose, capillary     Status: Abnormal   Collection Time: 01/26/16  4:13 AM  Result Value Ref Range   Glucose-Capillary 164 (H) 65 - 99 mg/dL  Renal function panel     Status: Abnormal   Collection Time: 01/26/16  5:35 AM  Result Value Ref Range   Sodium 140 135 - 145 mmol/L   Potassium 3.8 3.5 - 5.1 mmol/L   Chloride 105 101 - 111 mmol/L   CO2 28 22 - 32 mmol/L   Glucose, Bld 147 (H) 65 -  99 mg/dL   BUN 43 (H) 6 - 20 mg/dL   Creatinine, Ser 1.08 (H) 0.44 - 1.00 mg/dL   Calcium 9.6 8.9 - 10.3 mg/dL   Phosphorus 3.3 2.5 - 4.6 mg/dL   Albumin 3.0 (L) 3.5 - 5.0 g/dL   GFR calc non Af Amer 59 (L) >60 mL/min   GFR calc Af Amer >60 >60 mL/min    Comment: (NOTE) The eGFR has been calculated using the CKD EPI equation. This calculation has not been validated in all clinical situations. eGFR's persistently <60 mL/min signify possible Chronic Kidney Disease.    Anion gap 7 5 - 15  Magnesium     Status: Abnormal   Collection Time: 01/26/16  5:35 AM  Result Value Ref Range   Magnesium 2.5 (H) 1.7 - 2.4 mg/dL  APTT     Status: Abnormal   Collection Time: 01/26/16  5:35 AM  Result Value Ref Range   aPTT 39 (H) 24 - 36 seconds    Comment:        IF BASELINE aPTT IS ELEVATED, SUGGEST PATIENT RISK ASSESSMENT BE USED TO DETERMINE APPROPRIATE ANTICOAGULANT THERAPY.   Procalcitonin     Status: None   Collection Time: 01/26/16  5:35 AM  Result  Value Ref Range   Procalcitonin 0.70 ng/mL    Comment:        Interpretation: PCT > 0.5 ng/mL and <= 2 ng/mL: Systemic infection (sepsis) is possible, but other conditions are known to elevate PCT as well. (NOTE)         ICU PCT Algorithm               Non ICU PCT Algorithm    ----------------------------     ------------------------------         PCT < 0.25 ng/mL                 PCT < 0.1 ng/mL     Stopping of antibiotics            Stopping of antibiotics       strongly encouraged.               strongly encouraged.    ----------------------------     ------------------------------       PCT level decrease by               PCT < 0.25 ng/mL       >= 80% from peak PCT       OR PCT 0.25 - 0.5 ng/mL          Stopping of antibiotics                                             encouraged.     Stopping of antibiotics           encouraged.    ----------------------------     ------------------------------       PCT level decrease by              PCT >= 0.25 ng/mL       < 80% from peak PCT        AND PCT >= 0.5 ng/mL             Continuing antibiotics  encouraged.       Continuing antibiotics            encouraged.    ----------------------------     ------------------------------     PCT level increase compared          PCT > 0.5 ng/mL         with peak PCT AND          PCT >= 0.5 ng/mL             Escalation of antibiotics                                          strongly encouraged.      Escalation of antibiotics        strongly encouraged.   CBC     Status: Abnormal   Collection Time: 01/26/16  5:35 AM  Result Value Ref Range   WBC 16.0 (H) 4.0 - 10.5 K/uL   RBC 2.60 (L) 3.87 - 5.11 MIL/uL   Hemoglobin 9.1 (L) 12.0 - 15.0 g/dL   HCT 27.6 (L) 36.0 - 46.0 %   MCV 106.2 (H) 78.0 - 100.0 fL   MCH 35.0 (H) 26.0 - 34.0 pg   MCHC 33.0 30.0 - 36.0 g/dL   RDW 24.7 (H) 11.5 - 15.5 %   Platelets 59 (L) 150 - 400 K/uL    Comment:  CONSISTENT WITH PREVIOUS RESULT  Glucose, capillary     Status: Abnormal   Collection Time: 01/26/16  8:30 AM  Result Value Ref Range   Glucose-Capillary 129 (H) 65 - 99 mg/dL  Hepatic function panel     Status: Abnormal   Collection Time: 01/26/16  9:27 AM  Result Value Ref Range   Total Protein 7.3 6.5 - 8.1 g/dL   Albumin 3.1 (L) 3.5 - 5.0 g/dL   AST 69 (H) 15 - 41 U/L   ALT 48 14 - 54 U/L   Alkaline Phosphatase 198 (H) 38 - 126 U/L   Total Bilirubin 10.9 (H) 0.3 - 1.2 mg/dL   Bilirubin, Direct 5.1 (H) 0.1 - 0.5 mg/dL   Indirect Bilirubin 5.8 (H) 0.3 - 0.9 mg/dL  C difficile quick scan w PCR reflex     Status: None   Collection Time: 01/26/16 10:51 AM  Result Value Ref Range   C Diff antigen NEGATIVE NEGATIVE   C Diff toxin NEGATIVE NEGATIVE   C Diff interpretation No C. difficile detected.   Culture, blood (Routine X 2) w Reflex to ID Panel     Status: None (Preliminary result)   Collection Time: 01/26/16 11:18 AM  Result Value Ref Range   Specimen Description BLOOD LEFT HAND    Special Requests IN PEDIATRIC BOTTLE 1 CC    Culture      NO GROWTH 1 DAY Performed at [facility]    Report Status PENDING   Culture, blood (Routine X 2) w Reflex to ID Panel     Status: None (Preliminary result)   Collection Time: 01/26/16 11:25 AM  Result Value Ref Range   Specimen Description BLOOD LEFT ARM    Special Requests IN PEDIATRIC BOTTLE 3 CC    Culture      NO GROWTH 1 DAY Performed at [facility]    Report Status PENDING   Glucose, capillary     Status: Abnormal   Collection Time:  01/26/16 12:00 PM  Result Value Ref Range   Glucose-Capillary 181 (H) 65 - 99 mg/dL  Renal function panel (daily at 1600)     Status: Abnormal   Collection Time: 01/26/16  4:00 PM  Result Value Ref Range   Sodium 140 135 - 145 mmol/L   Potassium 3.9 3.5 - 5.1 mmol/L   Chloride 107 101 - 111 mmol/L   CO2 26 22 - 32 mmol/L   Glucose, Bld 189 (H) 65 - 99 mg/dL   BUN 48 (H) 6 - 20  mg/dL   Creatinine, Ser 1.28 (H) 0.44 - 1.00 mg/dL   Calcium 9.8 8.9 - 10.3 mg/dL   Phosphorus 4.0 2.5 - 4.6 mg/dL   Albumin 2.8 (L) 3.5 - 5.0 g/dL   GFR calc non Af Amer 48 (L) >60 mL/min   GFR calc Af Amer 56 (L) >60 mL/min    Comment: (NOTE) The eGFR has been calculated using the CKD EPI equation. This calculation has not been validated in all clinical situations. eGFR's persistently <60 mL/min signify possible Chronic Kidney Disease.    Anion gap 7 5 - 15  Glucose, capillary     Status: Abnormal   Collection Time: 01/26/16  4:46 PM  Result Value Ref Range   Glucose-Capillary 162 (H) 65 - 99 mg/dL   Comment 1 Notify RN    Comment 2 Document in Chart   Glucose, capillary     Status: Abnormal   Collection Time: 01/26/16  7:50 PM  Result Value Ref Range   Glucose-Capillary 127 (H) 65 - 99 mg/dL   Comment 1 Notify RN   Glucose, capillary     Status: Abnormal   Collection Time: 01/26/16 11:34 PM  Result Value Ref Range   Glucose-Capillary 131 (H) 65 - 99 mg/dL   Comment 1 Notify RN   Glucose, capillary     Status: Abnormal   Collection Time: 01/27/16  3:36 AM  Result Value Ref Range   Glucose-Capillary 137 (H) 65 - 99 mg/dL   Comment 1 Notify RN   I-STAT 3, arterial blood gas (G3+)     Status: Abnormal   Collection Time: 01/27/16  4:22 AM  Result Value Ref Range   pH, Arterial 7.480 (H) 7.350 - 7.450   pCO2 arterial 40.3 32.0 - 48.0 mmHg   pO2, Arterial 71.0 (L) 83.0 - 108.0 mmHg   Bicarbonate 30.0 (H) 20.0 - 28.0 mmol/L   TCO2 31 0 - 100 mmol/L   O2 Saturation 95.0 %   Acid-Base Excess 6.0 (H) 0.0 - 2.0 mmol/L   Patient temperature 98.6 F    Collection site RADIAL, ALLEN'S TEST ACCEPTABLE    Drawn by Operator    Sample type ARTERIAL   Magnesium     Status: Abnormal   Collection Time: 01/27/16  4:47 AM  Result Value Ref Range   Magnesium 2.8 (H) 1.7 - 2.4 mg/dL  Procalcitonin     Status: None   Collection Time: 01/27/16  4:47 AM  Result Value Ref Range    Procalcitonin 1.52 ng/mL    Comment:        Interpretation: PCT > 0.5 ng/mL and <= 2 ng/mL: Systemic infection (sepsis) is possible, but other conditions are known to elevate PCT as well. (NOTE)         ICU PCT Algorithm               Non ICU PCT Algorithm    ----------------------------     ------------------------------  PCT < 0.25 ng/mL                 PCT < 0.1 ng/mL     Stopping of antibiotics            Stopping of antibiotics       strongly encouraged.               strongly encouraged.    ----------------------------     ------------------------------       PCT level decrease by               PCT < 0.25 ng/mL       >= 80% from peak PCT       OR PCT 0.25 - 0.5 ng/mL          Stopping of antibiotics                                             encouraged.     Stopping of antibiotics           encouraged.    ----------------------------     ------------------------------       PCT level decrease by              PCT >= 0.25 ng/mL       < 80% from peak PCT        AND PCT >= 0.5 ng/mL             Continuing antibiotics                                              encouraged.       Continuing antibiotics            encouraged.    ----------------------------     ------------------------------     PCT level increase compared          PCT > 0.5 ng/mL         with peak PCT AND          PCT >= 0.5 ng/mL             Escalation of antibiotics                                          strongly encouraged.      Escalation of antibiotics        strongly encouraged.   CBC     Status: Abnormal   Collection Time: 01/27/16  4:47 AM  Result Value Ref Range   WBC 14.8 (H) 4.0 - 10.5 K/uL   RBC 2.32 (L) 3.87 - 5.11 MIL/uL   Hemoglobin 7.9 (L) 12.0 - 15.0 g/dL   HCT 25.0 (L) 36.0 - 46.0 %   MCV 107.8 (H) 78.0 - 100.0 fL   MCH 34.1 (H) 26.0 - 34.0 pg   MCHC 31.6 30.0 - 36.0 g/dL   RDW 24.9 (H) 11.5 - 15.5 %   Platelets 52 (L) 150 - 400 K/uL    Comment: CONSISTENT WITH PREVIOUS  RESULT  Renal function panel     Status: Abnormal   Collection Time: 01/27/16  4:48 AM  Result Value Ref Range   Sodium 140 135 - 145 mmol/L   Potassium 4.3 3.5 - 5.1 mmol/L   Chloride 107 101 - 111 mmol/L   CO2 27 22 - 32 mmol/L   Glucose, Bld 150 (H) 65 - 99 mg/dL   BUN 67 (H) 6 - 20 mg/dL   Creatinine, Ser 2.22 (H) 0.44 - 1.00 mg/dL   Calcium 10.1 8.9 - 10.3 mg/dL   Phosphorus 6.2 (H) 2.5 - 4.6 mg/dL   Albumin 2.4 (L) 3.5 - 5.0 g/dL   GFR calc non Af Amer 25 (L) >60 mL/min   GFR calc Af Amer 29 (L) >60 mL/min    Comment: (NOTE) The eGFR has been calculated using the CKD EPI equation. This calculation has not been validated in all clinical situations. eGFR's persistently <60 mL/min signify possible Chronic Kidney Disease.    Anion gap 6 5 - 15  Glucose, capillary     Status: Abnormal   Collection Time: 01/27/16  8:26 AM  Result Value Ref Range   Glucose-Capillary 169 (H) 65 - 99 mg/dL   Comment 1 Document in Chart   Glucose, capillary     Status: Abnormal   Collection Time: 01/27/16 12:24 PM  Result Value Ref Range   Glucose-Capillary 157 (H) 65 - 99 mg/dL   Comment 1 Document in Chart   Ammonia     Status: Abnormal   Collection Time: 01/27/16  2:15 PM  Result Value Ref Range   Ammonia 40 (H) 9 - 35 umol/L  Glucose, capillary     Status: Abnormal   Collection Time: 01/27/16  4:04 PM  Result Value Ref Range   Glucose-Capillary 133 (H) 65 - 99 mg/dL   Comment 1 Document in Chart     Dg Chest Port 1 View  Result Date: 01/27/2016 CLINICAL DATA:  Acute respiratory failure common pneumonia, cirrhosis. EXAM: PORTABLE CHEST 1 VIEW COMPARISON:  Portable chest x-ray of January 25, 2016 FINDINGS: The lungs are adequately inflated. The interstitial markings remain coarse. There is patchy density at the left lung base which is less conspicuous today. There is better visualization of the left hemidiaphragm. The heart is top-normal in size. The pulmonary vascularity is less  engorged and more distinct. The endotracheal tube tip lies approximately 4.8 cm above the carina. Advancement by 2 cm is recommended. The esophagogastric tube tip projects below the inferior margin of the image. The right internal jugular venous catheter tip projects at the junction of the right and left brachiocephalic veins with the tip pointing cephalad. IMPRESSION: 1. Mild improvement in pulmonary interstitial edema though some edema remains. Decreased pulmonary vascular congestion with decreased left basilar atelectasis. No large pleural effusion. 2. Advancement of the nasogastric tube by 2 cm is recommended. There has been interval partial withdrawal of the left internal jugular catheter with its tip pointing cephalad and lying at the junction of the right and left brachiocephalic veins. Electronically Signed   By: David  Martinique M.D.   On: 01/27/2016 07:26    ROS unable to obtain Blood pressure (!) 91/42, pulse (!) 109, temperature (!) 101.3 F (38.5 C), temperature source Oral, resp. rate 13, height '5\' 6"'$  (1.676 m), weight 62.6 kg (138 lb 0.1 oz), SpO2 99 %. Physical Exam sedated in ICU with NG feeds and ventilator abdomen is soft occasional bowel sounds she does move a little with exam labs and x-rays reviewed  Assessment/Plan: Liver and renal failure Plan: Have recommended a set of urinary  lites and CVP measurement to determine whether she is truly in hepatorenal or not and if she is I would transfer her to a transplant center if they are willing to take her to discuss possible transplantation otherwise continued supportive care per ICU team and I do not know of any other way I could be helpful but please call us if any questions you've think we could assist with  [facility] E 01/27/2016, 6:49 PM

## 2016-01-27 NOTE — Progress Notes (Signed)
Nutrition Follow-up  INTERVENTION:   Nepro @ 25 ml/hr (600 ml/day) 60 ml Prostat BID Provides: 1480 kcal, 108 grams protein, 436 ml H2O, 432 mg PO4.   NUTRITION DIAGNOSIS:   Inadequate oral intake related to inability to eat as evidenced by NPO status. Ongoing.   GOAL:   Patient will meet greater than or equal to 90% of their needs Met.   MONITOR:   Vent status, TF tolerance, Weight trends, Labs, I & O's  REASON FOR ASSESSMENT:   Ventilator, Consult Enteral/tube feeding initiation and management  ASSESSMENT:   50 y.o. female with ETOH cirrhosis, admitted 9/2 with HCAP. Had deterioration 9/5 after aspiration event and required intubation early AM hours 9/6 due to increased WOB and hypoxia. Has ARDS, multi-organ failure, started CVVHD 9/8.  Per Renal AKI in setting of PNA and SIRS started on CVVHD for volume removal, no sign of renal recovery at this time.  9/13 tx to [facility] for intermittent HD on CVVHD 9/8 - 9/13. No indications HD at this time, may need catheter holiday. GI consulted for possible hepatorenal syndrome. Pt is anuric.   Patient is currently intubated on ventilator support MV: 8.6 L/min Temp (24hrs), Avg:98.7 F (37.1 C), Min:97.9 F (36.6 C), Max:99.1 F (37.3 C)  Medications reviewed and include: folic acid, lactulose, MVI, thiamine Labs reviewed: PO4 6.2, magnesium 2.8 CBG: 169 OG tube tip in proximal duodenum (9/6 xray)  Diet Order:  Diet NPO time specified  Skin:  Reviewed, no issues  Last BM:  9/13 675 ml via rectal tube  Height:   Ht Readings from Last 1 Encounters:  01/22/16 _0  (1.676 m)    Weight:   Wt Readings from Last 1 Encounters:  01/27/16 138 lb 0.1 oz (62.6 kg)    Ideal Body Weight:  59.09 kg  BMI:  Body mass index is 22.28 kg/m.  Estimated Nutritional Needs:   Kcal:  1498  Protein:  103-124 grams (1.5-1.8 grams/kg)  Fluid:  per MD/NP  EDUCATION NEEDS:   No education needs identified at this time  [place], [place], [place] Pager [phone] After Hours Pager

## 2016-01-27 NOTE — Progress Notes (Signed)
Dr Signe ColtUpton text paged to talk with patient's Husband Richard per his request to talk to her" kidney Doctors"

## 2016-01-28 ENCOUNTER — Encounter (HOSPITAL_COMMUNITY): Payer: Self-pay | Admitting: Internal Medicine

## 2016-01-28 DIAGNOSIS — Z789 Other specified health status: Secondary | ICD-10-CM

## 2016-01-28 LAB — RENAL FUNCTION PANEL
ALBUMIN: 2.3 g/dL — AB (ref 3.5–5.0)
ANION GAP: 9 (ref 5–15)
BUN: 112 mg/dL — ABNORMAL HIGH (ref 6–20)
CALCIUM: 10 mg/dL (ref 8.9–10.3)
CO2: 24 mmol/L (ref 22–32)
Chloride: 109 mmol/L (ref 101–111)
Creatinine, Ser: 3.63 mg/dL — ABNORMAL HIGH (ref 0.44–1.00)
GFR, EST AFRICAN AMERICAN: 16 mL/min — AB (ref >60)
GFR, EST NON AFRICAN AMERICAN: 14 mL/min — AB (ref >60)
GLUCOSE: 157 mg/dL — AB (ref 65–99)
PHOSPHORUS: 8.6 mg/dL — AB (ref 2.5–4.6)
POTASSIUM: 4.4 mmol/L (ref 3.5–5.1)
SODIUM: 142 mmol/L (ref 135–145)

## 2016-01-28 LAB — GLUCOSE, CAPILLARY
GLUCOSE-CAPILLARY: 150 mg/dL — AB (ref 65–99)
GLUCOSE-CAPILLARY: 105 mg/dL — AB (ref 65–99)
GLUCOSE-CAPILLARY: 171 mg/dL — AB (ref 65–99)
Glucose-Capillary: 168 mg/dL — ABNORMAL HIGH (ref 65–99)
Glucose-Capillary: 167 mg/dL — ABNORMAL HIGH (ref 65–99)
Glucose-Capillary: 127 mg/dL — ABNORMAL HIGH (ref 65–99)

## 2016-01-28 LAB — CBC
HCT: 24.5 % — ABNORMAL LOW (ref 36.0–46.0)
Hemoglobin: 8 g/dL — ABNORMAL LOW (ref 12.0–15.0)
MCH: 34.9 pg — ABNORMAL HIGH (ref 26.0–34.0)
MCHC: 32.7 g/dL (ref 30.0–36.0)
MCV: 107 fL — AB (ref 78.0–100.0)
PLATELETS: 57 10*3/uL — AB (ref 150–400)
RBC: 2.29 MIL/uL — AB (ref 3.87–5.11)
RDW: 24.9 % — AB (ref 11.5–15.5)
WBC: 16.6 10*3/uL — AB (ref 4.0–10.5)

## 2016-01-28 LAB — PROTIME-INR
INR: 1.99
Prothrombin Time: 22.9 seconds — ABNORMAL HIGH (ref 11.4–15.2)

## 2016-01-28 LAB — SODIUM, URINE, RANDOM: SODIUM UR: 33 mmol/L

## 2016-01-28 LAB — CREATININE, URINE, RANDOM: CREATININE, URINE: 146.69 mg/dL

## 2016-01-28 LAB — MAGNESIUM: MAGNESIUM: 3.1 mg/dL — AB (ref 1.7–2.4)

## 2016-01-28 LAB — AMMONIA: AMMONIA: 43 umol/L — AB (ref 9–35)

## 2016-01-28 MED ORDER — DIAZEPAM 5 MG/ML IJ SOLN
5.0000 mg | Freq: Two times a day (BID) | INTRAMUSCULAR | Status: DC
Start: 2016-01-28 — End: 2016-01-30
  Administered 2016-01-28 – 2016-01-30 (×3): 5 mg via INTRAVENOUS
  Filled 2016-01-28 (×2): qty 2

## 2016-01-28 MED ORDER — ALBUMIN HUMAN 25 % IV SOLN
25.0000 g | Freq: Once | INTRAVENOUS | Status: AC
  Administered 2016-01-28: 25 g via INTRAVENOUS
  Filled 2016-01-28: qty 100

## 2016-01-28 MED ORDER — PHENYLEPHRINE HCL 10 MG/ML IJ SOLN
0.0000 ug/min | INTRAVENOUS | Status: DC
  Administered 2016-01-28 – 2016-01-29 (×2): 20 ug/min via INTRAVENOUS
  Filled 2016-01-28 (×2): qty 1

## 2016-01-28 MED ORDER — DIAZEPAM 5 MG/ML IJ SOLN
5.0000 mg | INTRAMUSCULAR | Status: DC | PRN
  Filled 2016-01-28: qty 2

## 2016-01-28 NOTE — Progress Notes (Signed)
PULMONARY / CRITICAL CARE MEDICINE   Name: Kristina Morris MRN: [id number] DOB: [birth year]    ADMISSION DATE:  02/08/2016 CONSULTATION DATE:  01/19/16  REFERRING MD:  Cena Benton - [facility]  CHIEF COMPLAINT:  AMS, increased WOB  BRIEF:  50 y/o female with EtOH cirrhosis admitted on 9/2 with CAP, aspirated on 9/6 requiring intubation.  Subsequently, developed ARDS, multi-organ failure and AKI on CRRT.  Pt transferred to [facility] on 9/13 for intermittent HD  SUBJECTIVE:    No issues overnight vent wise.  Remains oliguric.   VITAL SIGNS: BP (!) 106/53 (BP Location: Left Arm)   Pulse 99   Temp 98 F (36.7 C) (Oral)   Resp 14   Ht 5\' 6"  (1.676 m)   Wt 64.2 kg (141 lb 8.6 oz)   SpO2 99%   BMI 22.84 kg/m   HEMODYNAMICS: CVP:  [12 mmHg] 12 mmHg  VENTILATOR SETTINGS: Vent Mode: PRVC FiO2 (%):  [30 %] 30 % Set Rate:  [15 bmp] 15 bmp Vt Set:  [400 mL] 400 mL PEEP:  [5 cmH20] 5 cmH20 Pressure Support:  [10 cmH20] 10 cmH20 Plateau Pressure:  [11 cmH20-12 cmH20] 12 cmH20  INTAKE / OUTPUT: I/O last 3 completed shifts: In: 1923.8 [I.V.:738.8; Other:120; NG/GT:1065] Out: 1710 [Urine:10; Stool:1700]   PHYSICAL EXAMINATION: General: chronically ill appearing on vent, jaundiced HENT: Scleral icterus, ETT in place Neuro: altered, open eyes to voice. (-) lateralizing signs CV: RRR, No MRG PULM: good ae. Crackles bibasilar. GI: distended abdomen but soft, +BS Derm: Trace edema  LABS:  BMET  Recent Labs Lab 01/26/16 1600 01/27/16 0448 01/28/16 0402  NA 140 140 142  K 3.9 4.3 4.4  CL 107 107 109  CO2 26 27 24   BUN 48* 67* 112*  CREATININE 1.28* 2.22* 3.63*  GLUCOSE 189* 150* 157*    Electrolytes  Recent Labs Lab 01/26/16 0535 01/26/16 1600 01/27/16 0447 01/27/16 0448 01/28/16 0402  CALCIUM 9.6 9.8  --  10.1 10.0  MG 2.5*  --  2.8*  --  3.1*  PHOS 3.3 4.0  --  6.2* 8.6*    CBC  Recent Labs Lab 01/25/16 0435 01/26/16 0535 01/27/16 0447  WBC 12.9* 16.0* 14.8*  HGB 8.6*  9.1* 7.9*  HCT 25.9* 27.6* 25.0*  PLT 54* 59* 52*    Coag's  Recent Labs Lab 01/24/16 0922 01/25/16 0435 01/26/16 0535  APTT 62* >200* 39*  INR 2.25  --   --     Sepsis Markers  Recent Labs Lab 01/25/16 0435 01/26/16 0535 01/27/16 0447  PROCALCITON 0.65 0.70 1.52    ABG  Recent Labs Lab 01/23/16 0440 01/23/16 1525 01/27/16 0422  PHART 7.382 7.352 7.480*  PCO2ART 44.0 48.7* 40.3  PO2ART 80.9* 90.4 71.0*    Liver Enzymes  Recent Labs Lab 01/26/16 0927 01/26/16 1600 01/27/16 0448 01/28/16 0402  AST 69*  --   --   --   ALT 48  --   --   --   ALKPHOS 198*  --   --   --   BILITOT 10.9*  --   --   --   ALBUMIN 3.1* 2.8* 2.4* 2.3*    Cardiac Enzymes No results for input(s): TROPONINI, PROBNP in the last 168 hours.  Glucose  Recent Labs Lab 01/27/16 1224 01/27/16 1604 01/27/16 1942 01/27/16 2347 01/28/16 0326 01/28/16 0803  GLUCAP 157* 133* 141* 178* 150* 168*    Imaging 9/10 CXR images reviewed: bilateral interstitial infiltrates again noted with air space  disease, improving    STUDIES:  CXR 9/5 >> multifocal PNA ECHO 9/7 >>EF 65-70% no wall motion abnormalities. PAS estimated at 32 mmHg peak.   CULTURES: Blood 9/2 >> neg Urine 9/2 >> neg Sputum 9/6 >> normal flora Blood 9/6 >> neg Blood 9/13 > (-) C diff 9/13 > (-)  ANTIBIOTICS: Cefepime 9/2 >> 9/7 Flagyl 9/5 >> 9/7 Meropenem 9/7 >> 9/13 Vanc 9/7 >> 9/9  SIGNIFICANT EVENTS: 9/02  Admit 9/06  PCCM called, pt intubated due to increased WOB 9/07  worsening hypoxia/ARDS, started on NMB- also progressive shock/MODS 9/08  HD cath placed. Start CRRT, off vasopressors temporarily 9/11  Off vasopressors, volume status improved, filter clotted  9/12  More alert, failed SBT.  9/13 transferred to [facility] for intermittent HD  LINES/TUBES: ETT 9/6 > L IJ CVL 9/7 >  R groin HD cath 9/9 >  R Rad Aline >>  9/12  DISCUSSION: 50 y.o. female with ETOH cirrhosis, admitted 9/2 with HCAP. Had  deterioration 9/5 after aspiration event and required intubation early AM hours 9/6 due to increased WOB and hypoxia. Has ARDS, multi-organ failure, started CVVHD 9/8. Overall condition improving 9/10. Transferred to [facility] for intermittent HD on 9/13.   ASSESSMENT / PLAN:  PULMONARY A: ARDS - oxygenation improving dramatically with volume removal Aspiration PNA - improved Acute pulm edema from cirrhosis, renal failure > improving P:   PRVC 8 cc/kg  Wean PEEP / FiO2 for sats >92% VAP prevention measures Daily WUA / SBT  >> not ready today. Has volumes issues.    CARDIOVASCULAR A:  SVT 01/20/2016 AM - likely levophed related Septic shock - 01/20/2016. Resolved.  Hx HLD Pulm edema P:  Monitor hemodynamics and tele. CVP was 22 this am.  Monitor off vasopressors / stress dose steroids  Hold preadmission furosemide, spironolactone Needs HD today.   NEUROLOGIC A:   Acute encephalopathy: multifactorial in setting of hepatic encephalopathy and respiratory failure Hx depression, ETOH use (reportedly now abstinent since 06/2015). Alcohol level was elevated in 10/2015.  P:   RASS goal 0 Daily WUA  Minimize sedation as able. Fentanyl drip, versed prn.  Continue folic acid, thiamine Hold preadmission cyclobenzaprine, sertraline, hydroxyzine PAD protocol with PRN versed at reduced dose / fentanyl only  GASTROINTESTINAL A:   Hepatic encephalopathy - rising ammonia 9/12 Hx ETOH cirrhosis, fatty liver, ascites Protein calorie malnutrition GI prophylaxis Nutrition P:   cont lactulose BID. Keep 3-5 BM/day Cont  rifaximin for hepatic encephalopathy Continue tube feedings Pepcid for stress ulcer prophylaxis Ideally needs liver transplant but pt is not abstinent  of ETOH. Last ETOH in June 2017 was elevated.   RENAL A:   AKI - started CVVHD 9/8 and dc'd on 9/13. Urine Na was 33 >> less likely HRS.  Hypophosphatemia P:   Pt needs HD today. May need CVVH if BP drops.  Has a R femoral HD  cath > looks clean > I spoke with IR today re: tunnelled HD cath. Plan for HD cath via IR today or in am at the latest. Once with tunnelled cath and ready to use, d/c femoral HD cath.   HEMATOLOGIC A:   Anemia - chronic without bleeding, 1 unit PRBC 9/8 Thrombocytopenia - chronic VTE Prophylaxis P:  Transfuse for Hgb < 7   No evidence of DIC > PT 25, INR 2.25, PTT 62, Fibrinogen 266, D-Dimer 17.45 Cont SCD's Trend CBC   INFECTIOUS A:   Aspiration PNA - improved 9/12 Septic shock. Resolved.  Elevated  WBC count, (-) fever x 24 hrs.  P:   Will observe off abx. She got 2 weeks of broad spectrum abx, finished 9/13.  Plan to remove femoral HD cath ASAP once with tunnelled cath  ENDOCRINE A:   Hyperglycemia  P:   SSI  Family updated:  Husband Raiford Noble(Rick) updated at bedside am 9/15. I told him re: POOR prognosis over all. Plan for palliative Care consult as Raiford NobleRick is having a hard time making decision. Recommend DNR.      Interdisciplinary Family Meeting v Palliative Care Meeting:  Due by: 9/18.  Critical care time- 30 mins.  Pollie MeyerJ. Angelo A de Dios, MD 01/28/2016, 10:46 AM [facility] Pager [phone] After 3 pm or if no answer, call [phone]

## 2016-01-28 NOTE — Progress Notes (Signed)
eLink Physician-Brief Progress Note Patient Name: Kristina CashLaura J Morris DOB: 10-15-1965 MRN: [id number]   Date of Service  01/28/2016  HPI/Events of Note  Bedside nurse reports patient with ongoing shock despite CVP 22. Intolerant of attempt at CVVHD. Has central venous access.   eICU Interventions  Initiating Neo-Synephrine to maintain mean arterial pressure and systolic blood pressure      Intervention Category Major Interventions: Shock - evaluation and management  Lawanda CousinsJennings Heily Carlucci 01/28/2016, 5:32 PM

## 2016-01-28 NOTE — Consult Note (Signed)
Chief Complaint: Patient was seen in consultation today for tunneled dialysis catheter placement Chief Complaint  Patient presents with  . Shortness of Breath    recent hx of pneumonia with shortness of breath for 3 days    at the request of Dr Alexis Frock  Referring Physician(s): Dr Alexis Frock  Supervising Physician: Irish Lack  Patient Status: Inpatient  History of Present Illness: Kristina Morris is a 50 y.o. female   Altered mental status Etoh cirrhosis Admitted 9/2 with pneumonia Required intubation---ARDS Multiorgan failure Renal failure Still oliguric No recovery expected Temp catheter was placed by CCM 01/21/2016---Rt groin  Request for tunneled catheter placement for long tern use per CCM Dr Alexis Frock   Past Medical History:  Diagnosis Date  . Alcoholic (HCC)   . Chicken pox   . Cirrhosis (HCC)   . Depression   . Fatty liver   . Hyperlipidemia   . Mitral valve prolapse   . Seasonal allergies     Past Surgical History:  Procedure Laterality Date  . cysts removal, sinus    . OVARIAN CYST REMOVAL    . TONSILLECTOMY AND ADENOIDECTOMY    . 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 Authorizing Provider  b complex vitamins tablet Take 1 tablet by mouth 2 (two) times a week. Reported on 11/02/2015   Yes Historical Provider, MD  BIOTIN PO Take 10,000 mcg by mouth daily. Reported on 11/02/2015   Yes Historical Provider, MD  calcium carbonate (OS-CAL) 600 MG TABS tablet Take 1 tablet (600 mg total) by mouth 2 (two) times daily with a meal. Reported on 11/02/2015 01/07/16  Yes Bradd Canary, MD  cyclobenzaprine (FLEXERIL) 5 MG tablet take 1 tablet by mouth at bedtime Patient taking differently: take 1 tablet by mouth at bedtime as needed for sleep/spasms  12/15/15  Yes Sheliah Hatch, MD  EPINEPHrine 0.3 mg/0.3 mL IJ SOAJ injection Inject 0.3 mg into the muscle as needed. Reported on 11/02/2015   Yes Historical Provider, MD  fexofenadine (ALLEGRA) 180 MG tablet Take 180 mg by mouth as needed for allergies. Reported on 11/02/2015   Yes Historical Provider, MD  fluticasone (FLONASE) 50 MCG/ACT nasal spray Place 2 sprays into both nostrils daily as needed for allergies. Reported on 11/02/2015   Yes Historical Provider, MD  folic acid (FOLVITE) 800 MCG tablet Take 800 mcg by mouth daily.   Yes Historical Provider, MD  Multiple Vitamin (DAILY MULTIVITAMIN PO) Take 1 tablet by mouth daily.   Yes Historical Provider, MD  sertraline (ZOLOFT) 100 MG tablet Take 1 tablet (100 mg total) by mouth every evening. 02/01/15  Yes Sheliah Hatch, MD  spironolactone (ALDACTONE) 50 MG tablet Take 1 tablet (50 mg total) by mouth daily. 10/31/15  Yes Calvert Cantor, MD  sulfamethoxazole-trimethoprim (BACTRIM,SEPTRA) 400-80 MG tablet Take 1 tablet by mouth daily. 11/20/15  Yes Historical Provider, MD  traMADol (ULTRAM) 50 MG tablet take 1 tablet by mouth every 12 hours if needed Patient taking differently: Take 50 mg by mouth every 12 (twelve) hours as needed (for pain). take 1 tablet by mouth every 12 hours if needed 12/27/15  Yes Sandford Craze, NP  vitamin E 400 UNIT capsule Take 400 Units by mouth daily. Reported on 11/02/2015   Yes Historical  Provider, MD  ibuprofen (ADVIL,MOTRIN) 200 MG tablet Take 200 mg by mouth every 6 (six) hours as needed for moderate pain. Reported on 11/02/2015    Historical Provider, MD     Family History  Problem Relation Age of Onset  . Liver disease Brother     older brother  . Depression Mother   . Breast cancer Mother   . Osteoporosis Mother   . Liver disease Maternal Aunt   . Liver disease Paternal Aunt   . Alcoholism      both sides of family  . Diabetes Brother   . Hypertension Brother     older brother  . Cancer      both  sides of family  . Heart disease Father   . Heart disease Maternal Grandmother     Social History   Social History  . Marital status: Married    Spouse name: N/A  . Number of children: N/A  . Years of education: N/A   Social History Main Topics  . Smoking status: Current Some Day Smoker    Types: Cigarettes  . Smokeless tobacco: Never Used     Comment: Smoke about 1-2 times a week  . Alcohol use 0.0 oz/week     Comment: Patient states she has not drank since February/2017  . Drug use: No  . Sexual activity: Yes    Partners: Male   Other Topics Concern  . None   Social History Narrative  . None     Review of Systems: A 12 point ROS discussed and pertinent positives are indicated in the HPI above.  All other systems are negative.  Review of Systems  Constitutional:       No response On vent    Vital Signs: BP (!) 110/55 (BP Location: Left Arm)   Pulse (!) 107   Temp 98 F (36.7 C) (Oral)   Resp 14   Ht 5\' 6"  (1.676 m)   Wt 141 lb 8.6 oz (64.2 kg)   SpO2 100%   BMI 22.84 kg/m   Physical Exam  Cardiovascular: Normal rate and regular rhythm.   Skin: Skin is warm and dry.  Psychiatric:  Consented for procedure with husband via phone  Nursing note and vitals reviewed.   Mallampati Score:  MD Evaluation Airway: Other (comments) Airway comments: vent Heart: WNL Abdomen: WNL Chest/ Lungs: Other (comments) Chest/ lungs comments: vent ASA  Classification: 3 Mallampati/Airway Score: Three  Imaging: Dg Chest 2 View  Result Date: 02/08/16 CLINICAL DATA:  Acute onset of shortness of breath. Initial encounter. EXAM: CHEST  2 VIEW COMPARISON:  Chest radiograph performed 01/10/2016 FINDINGS: Bilateral apical airspace opacification is noted, concerning for multifocal pneumonia. Small bilateral pleural effusions are noted. No pneumothorax is seen. The heart is borderline enlarged. No acute osseous abnormalities are identified. IMPRESSION: Biapical airspace  opacification, concerning for significantly worsening multifocal pneumonia. Small bilateral pleural effusions noted. Borderline cardiomegaly. Electronically Signed   By: Roanna Raider M.D.   On: 08-Feb-2016 21:40   Dg Chest 2 View  Result Date: 01/10/2016 CLINICAL DATA:  Bilateral lower extremity edema. Dyspnea and chest pain today while walking. EXAM: CHEST  2 VIEW COMPARISON:  12/27/2015 FINDINGS: There is focal airspace consolidation in the left lung base. There are pleural effusions bilaterally, left larger than right. The pulmonary vasculature appears normal. Hilar and mediastinal contours are unremarkable and unchanged. Heart size is normal. IMPRESSION: Left base consolidation and pleural effusions. This may represent pneumonia. If the patient clinically has  pneumonia, follow-up radiography in 3-6 weeks is recommended to confirm complete clearing. Electronically Signed   By: Ellery Plunkaniel R Mitchell M.D.   On: 01/10/2016 21:12   Dg Abd 1 View  Result Date: 01/19/2016 CLINICAL DATA:  Orogastric tube placement EXAM: ABDOMEN - 1 VIEW COMPARISON:  January 13, 2016 FINDINGS: Orogastric tube tip is at the level of the proximal duodenum with the side port in the distal stomach. There is a loop of mildly dilated transverse colon. There is no appreciable small bowel dilatation. No free air evident. There is consolidation in the left lung base with small left pleural effusion. IMPRESSION: Nasogastric tip in proximal duodenum with side port in distal stomach. Question a degree of colonic ileus. No bowel obstruction evident. No free air. Left base consolidation with small left pleural effusion noted. Electronically Signed   By: Bretta BangWilliam  Woodruff III M.D.   On: 01/19/2016 08:16   Dg Chest Port 1 View  Result Date: 01/27/2016 CLINICAL DATA:  Acute respiratory failure common pneumonia, cirrhosis. EXAM: PORTABLE CHEST 1 VIEW COMPARISON:  Portable chest x-ray of January 25, 2016 FINDINGS: The lungs are adequately  inflated. The interstitial markings remain coarse. There is patchy density at the left lung base which is less conspicuous today. There is better visualization of the left hemidiaphragm. The heart is top-normal in size. The pulmonary vascularity is less engorged and more distinct. The endotracheal tube tip lies approximately 4.8 cm above the carina. Advancement by 2 cm is recommended. The esophagogastric tube tip projects below the inferior margin of the image. The right internal jugular venous catheter tip projects at the junction of the right and left brachiocephalic veins with the tip pointing cephalad. IMPRESSION: 1. Mild improvement in pulmonary interstitial edema though some edema remains. Decreased pulmonary vascular congestion with decreased left basilar atelectasis. No large pleural effusion. 2. Advancement of the nasogastric tube by 2 cm is recommended. There has been interval partial withdrawal of the left internal jugular catheter with its tip pointing cephalad and lying at the junction of the right and left brachiocephalic veins. Electronically Signed   By: David  SwazilandJordan M.D.   On: 01/27/2016 07:26   Dg Chest Port 1 View  Result Date: 01/25/2016 CLINICAL DATA:  ARDS, intubated patient EXAM: PORTABLE CHEST 1 VIEW COMPARISON:  January 24, 2016 portable chest x-ray FINDINGS: The lungs are reasonably well inflated. The interstitial markings remain increased. The left hemidiaphragm is better demonstrated today. The cardiac silhouette is top-normal in size. The central pulmonary vascularity is prominent but more distinct today. The endotracheal tube tip lies 3 cm above the carina. The esophagogastric tube tip projects below the inferior margin of the image. The left internal jugular venous catheter tip projects over the distal SVC. IMPRESSION: Mild interval improvement in the appearance of the pulmonary interstitium may reflect decreased interstitial edema or pneumonia. Persistent bibasilar infiltrates  or atelectasis. Mild central pulmonary vascular prominence. The support tubes are in stable position. Electronically Signed   By: David  [place]Jordan M.D.   On: 01/25/2016 07:21   Dg Chest Port 1 View  Result Date: 01/24/2016 CLINICAL DATA:  ARDS EXAM: PORTABLE CHEST 1 VIEW COMPARISON:  01/23/2016 FINDINGS: The support apparatus is stable. The NG tube is 2.7 cm above the carina. Persistent diffuse interstitial and airspace process and left pleural effusion. IMPRESSION: Stable support apparatus. Persistent diffuse interstitial and airspace process and left effusion. Electronically Signed   By: Rudie MeyerP.  Gallerani M.D.   On: 01/24/2016 07:40   [facility]  Result Date: 01/23/2016 CLINICAL DATA:  Acute respiratory failure with hypoxemia. EXAM: PORTABLE CHEST 1 VIEW COMPARISON:  Radiograph of January 22, 2016. FINDINGS: Stable cardiomediastinal silhouette. Endotracheal and nasogastric tubes in grossly good position. Stable position of left internal jugular catheter. No pneumothorax is noted. Stable bilateral lung opacities are noted, with left worse than right. This is concerning for pneumonia or possibly edema. Bony thorax is unremarkable. IMPRESSION: Stable support apparatus.  Stable bilateral lung opacities. Electronically Signed   By: Lupita Raider, M.D.   On: 01/23/2016 07:15   Dg Chest Port 1 View  Result Date: 01/22/2016 CLINICAL DATA:  Adult respiratory distress syndrome. EXAM: PORTABLE CHEST 1 VIEW COMPARISON:  Radiograph of January 21, 2016. FINDINGS: Endotracheal and nasogastric tubes are unchanged in position. Left internal jugular catheter is unchanged in position. No pneumothorax or pleural effusion is noted. Stable bilateral lung opacities are noted concerning for pneumonia or possibly edema. Bony thorax is unremarkable. IMPRESSION: Stable bilateral lung opacities as described above. Stable support apparatus. Electronically Signed   By: Lupita Raider, M.D.   On: 01/22/2016 09:06   Dg Chest  Port 1 View  Result Date: 01/21/2016 CLINICAL DATA:  Respiratory failure. EXAM: PORTABLE CHEST 1 VIEW COMPARISON:  01/20/2016 . FINDINGS: Endotracheal tube, NG tube in stable position. Left IJ line stable position. Heart size stable. Diffuse bilateral dense airspace disease again noted. No prominent pleural effusion.  No pneumothorax . IMPRESSION: 1. Lines and tubes in stable position. 2. Diffuse dense bilateral airspace and disease again noted. No interim change Electronically Signed   By: Maisie Fus  Register   On: 01/21/2016 07:25   Dg Chest Port 1 View  Result Date: 01/20/2016 CLINICAL DATA:  Endotracheal tube repositioning. Acute onset of respiratory failure. Initial encounter. EXAM: PORTABLE CHEST 1 VIEW COMPARISON:  Chest radiograph performed 01/19/2016 FINDINGS: The patient's endotracheal tube is seen ending 3 cm above the carina. Enteric tubes are noted extending below the diaphragm. Worsening diffuse bilateral airspace opacification is noted, concerning for worsening bilateral pneumonia. A small left pleural effusion is suspected. No pneumothorax is seen. The lungs are mildly hypoexpanded. The cardiomediastinal silhouette is mildly enlarged. No acute osseous abnormalities are seen. IMPRESSION: 1. Endotracheal tube seen ending 3 cm above the carina. 2. Worsening diffuse bilateral airspace opacification, concerning for worsening bilateral pneumonia. Small left pleural effusion suspected. 3. Mild cardiomegaly. Electronically Signed   By: Roanna Raider M.D.   On: 01/20/2016 06:42   Dg Chest Port 1 View  Result Date: 01/20/2016 CLINICAL DATA:  Central line placement.  Initial encounter. EXAM: PORTABLE CHEST 1 VIEW COMPARISON:  Chest radiograph performed earlier today at 4:39 a.m. FINDINGS: The patient's endotracheal tube is seen ending 3 cm above the carina. The left IJ line is noted ending about the distal SVC. An enteric tube is noted extending below the diaphragm. Diffuse bilateral airspace opacification  is noted, similar in appearance to the prior study and concerning for diffuse bilateral pneumonia. A small left pleural effusion is suspected. No pneumothorax is seen. The cardiomediastinal silhouette is borderline normal in size. No acute osseous abnormalities are identified. IMPRESSION: 1. Endotracheal tube seen ending 3 cm above the carina. 2. Left IJ line noted ending about the distal SVC. 3. Persistent diffuse bilateral airspace opacification is concerning for diffuse bilateral pneumonia. Suspect small left pleural effusion. Electronically Signed   By: Roanna Raider M.D.   On: 01/20/2016 06:40   Dg Chest Port 1 View  Result Date: 01/19/2016 CLINICAL DATA:  50 year old female  status post intubation EXAM: PORTABLE CHEST 1 VIEW COMPARISON:  Chest radiograph dated 01/18/2016 FINDINGS: There has been interval placement of an endotracheal tube the tip approximately 2 cm above the carina. An enteric tube courses into the left hemi abdomen with tip beyond the inferior margin of the image. Patchy areas of airspace opacity at the left lung base with atelectatic changes of the left lung. Right lung base atelectatic changes versus infiltrate is also noted. The overall appearance of the lungs are similar to prior study. No significant pleural effusion. There is no pneumothorax. Osteopenia. No acute osseous pathology. IMPRESSION: Interval placement of an endotracheal and enteric tube. The endotracheal tube is above the carina. Bilateral airspace opacity similar to prior study. Follow-up recommended. Electronically Signed   By: Elgie Collard M.D.   On: 01/19/2016 04:01   Dg Chest Port 1 View  Result Date: 01/18/2016 CLINICAL DATA:  Hypoxia. EXAM: PORTABLE CHEST 1 VIEW COMPARISON:  Radiograph of January 15, 2016. FINDINGS: Stable cardiomediastinal silhouette. No pneumothorax is noted. Increased right upper and lower lobe opacities are noted concerning for atelectasis or possibly pneumonia. Increased diffuse lung  opacity is noted most consistent with pneumonia or possibly edema. Bony thorax is unremarkable. IMPRESSION: Increased bilateral lung opacities are noted concerning for multifocal pneumonia. Electronically Signed   By: Lupita Raider, M.D.   On: 01/18/2016 20:38    Labs:  CBC:  Recent Labs  01/24/16 0339 01/24/16 0922 01/25/16 0435 01/26/16 0535 01/27/16 0447  WBC 10.0  --  12.9* 16.0* 14.8*  HGB 8.0*  --  8.6* 9.1* 7.9*  HCT 23.5*  --  25.9* 27.6* 25.0*  PLT 36* 38* 54* 59* 52*    COAGS:  Recent Labs  10/29/15 0457 01/16/16 0405 01/19/16 1709  01/24/16 0339 01/24/16 0922 01/25/16 0435 01/26/16 0535  INR 2.12* 2.24 2.67  --   --  2.25  --   --   APTT  --   --  45*  < > 47* 62* >200* 39*  < > = values in this interval not displayed.  BMP:  Recent Labs  01/26/16 0535 01/26/16 1600 01/27/16 0448 01/28/16 0402  NA 140 140 140 142  K 3.8 3.9 4.3 4.4  CL 105 107 107 109  CO2 28 26 27 24   GLUCOSE 147* 189* 150* 157*  BUN 43* 48* 67* 112*  CALCIUM 9.6 9.8 10.1 10.0  CREATININE 1.08* 1.28* 2.22* 3.63*  GFRNONAA 59* 48* 25* 14*  GFRAA >60 56* 29* 16*    LIVER FUNCTION TESTS:  Recent Labs  01/28/2016 2115 01/16/16 0405 01/18/16 2136  01/26/16 0927 01/26/16 1600 01/27/16 0448 01/28/16 0402  BILITOT 16.6* 15.5* 17.3*  --  10.9*  --   --   --   AST 82* 72* 68*  --  69*  --   --   --   ALT 35 32 30  --  48  --   --   --   ALKPHOS 159* 140* 137*  --  198*  --   --   --   PROT 6.6 5.9* 6.3*  --  7.3  --   --   --   ALBUMIN 2.6* 2.4* 2.3*  < > 3.1* 2.8* 2.4* 2.3*  < > = values in this interval not displayed.  TUMOR MARKERS:  Recent Labs  10/28/15 0943  AFPTM 4.6    Assessment and Plan:  Etoh Cirrhosis CAP required intubation---ARDS Organ failure---renal failure Temp cath in Rt groin 9/8 per  CCM---in place Need for more stable catheter for longer term use Now scheduled for tunneled hemodialysis catheter placement Risks and Benefits discussed with  the patient's husband including, but not limited to bleeding, infection, vascular injury, pneumothorax which may require chest tube placement, air embolism or even death All of the patient's husbands questions were answered, he is agreeable to proceed. Consent signed and in chart.  INR 2.25 9/11---checking stat now  Thank you for this interesting consult.  I greatly enjoyed meeting Kristina Morris and look forward to participating in their care.  A copy of this report was sent to the requesting provider on this date.  Electronically Signed: Brendia Dampier A 01/28/2016, 11:45 AM   I spent a total of 40 Minutes    in face to face in clinical consultation, greater than 50% of which was counseling/coordinating care for tunneled HD catheter

## 2016-01-28 NOTE — Care Management Note (Signed)
Case Management Note  Patient Details  Name: Roque CashLaura J Caudell MRN: [id number] Date of Birth: 07-17-1965  Subjective/Objective:       [facility]  Pt was intubated due to resp distress, renal failure so CRRT was initiated.  [facility].  Pt remains ventilated at this time           Action/Plan:     Expected Discharge Date:   (unknown)               Expected Discharge Plan:     In-House Referral:     Discharge planning Services  CM Consult  Post Acute Care Choice:    Choice offered to:     DME Arranged:    DME Agency:     HH Arranged:    HH Agency:     Status of Service:  In process, will continue to follow  If discussed at Long Length of Stay Meetings, dates discussed:    Additional Comments:  Cherylann ParrClaxton, Alanie Syler S, RN 01/28/2016, 9:45 AM

## 2016-01-28 NOTE — Consult Note (Addendum)
50 yo with ETOH Liver Failure. Admitted with PNA and Hepatorenal failure. Poor prognosis. Palliative consulted for terminal care.   Met with patient's husband. There are very complex family dynamics- he is non-decisional today. She has a 24yo son who also has substance abuse issues, an 69 year old daughter and her mother who lives across the street. The relationship with her husband has been difficult-he has not been able to continue to live with her because of her very serious issues. He tells me she has been to rehab multiple times but cannot stay sober for any significant length of time.  We discussed ETOH addiction as a terminal illness when the disease is refractory to ongoing treatment and rehabilitation. Her liver cirrhosis rapidly progressed in the setting of continued ETOH abuse. 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  She is currently on HD, but having issues with low BP. UOP remains poor. She would not be a good candidate for long term HD.  Will need to get the entire family in for goals of care meeting- she is not expected to survive this hospitalization.  Palliative will follow-she has been extremely agitated- she has a history of withdrawal seizure- she has been drinking up until this admission. I added on low dose diazepam for withdrawal, agitation and seizure prophylaxis.  Time: 145-220 Total Time: 35 minutes Greater than 50%  of this time was spent counseling and coordinating care related to the above assessment and plan.  Lane Hacker, DO Palliative Medicine

## 2016-01-28 NOTE — Procedures (Signed)
Patient seen on Hemodialysis. QB 400, UF goal 2L (but unlikely to get to this because of low BP) BP (!) 83/43   Pulse (!) 108   Temp 98 F (36.7 C) (Oral)   Resp 11   Ht 5\' 6"  (1.676 m)   Wt 64.2 kg (141 lb 8.6 oz)   SpO2 98%   BMI 22.84 kg/m   Treatment adjusted as needed.  Zetta BillsJay Ronn Smolinsky MD [facility]. Office # [phone] Pager # [phone] 2:33 PM

## 2016-01-28 NOTE — Progress Notes (Signed)
[facility] Progress Note   Assessment/ Plan:   1. AKI in setting of PNA and SIRS as well as hyperbilirubinemia (due to cirrhosis). She was previously on CVVHD from 01/21/16 to 01/26/16 mainly for volume removal and then transferred to [facility] for the possibility that she may be able to transition to IHD. She did not have acute dialysis indications yesterday and was seen by gastroenterology to help tease out the possibility of HRS. Urine sodium 33 pointing away from HRS along with CVP of >20. We'll order for hemodialysis today via femoral dialysis catheter and discussed with CCM to get tunneled dialysis catheter placed by IR for what appears to be prolonged dialysis needs. If she does not tolerate conventional hemodialysis, will need to transition back to CVVH. 2. VDRF due to aspiration vs. CAP/ARDS- Continue ventilator management per CCM. Will attempt ultrafiltration with hemodialysis. 3. 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 4. Thrombocytopenia- presumably related to her cirrhosis/chronic alcoholism. No bleeding diathesis noted. Continue heparin free dialysis 5. SIRS- off pressors with intermittently depressed blood pressures-  Subjective:   Questions from overnight noted and appreciate input from gastroenterology. Bladder scan yesterday seemingly showed about 200 mL of urine however in and out cath only yielded 10 mL (A skin likely affected by ascites).    Objective:   BP (!) 105/57 (BP Location: Left Arm)   Pulse 98   Temp 98 F (36.7 C) (Oral)   Resp 14   Ht 5\' 6"  (1.676 m)   Wt 64.2 kg (141 lb 8.6 oz)   SpO2 100%   BMI 22.84 kg/m   Intake/Output Summary (Last 24 hours) at 01/28/16 1610 Last data filed at 01/28/16 0800  Gross per 24 hour  Intake          1493.13 ml  Output             1710 ml  Net          -216.87 ml   Weight change: 2 kg (4 lb 6.6  oz)  Physical Exam: [id number], sedated, appears comfortable CVS: Pulse regular tachycardia, S1 and S2 normal Resp: Coarse/mechanical breath sounds bilaterally Abd: Soft, slightly distended, nontender Ext: 1+ Edema noted in dependent areas, trace edema over ankles  Imaging: Dg Chest Port 1 View  Result Date: 01/27/2016 CLINICAL DATA:  Acute respiratory failure common pneumonia, cirrhosis. EXAM: PORTABLE CHEST 1 VIEW COMPARISON:  Portable chest x-ray of January 25, 2016 FINDINGS: The lungs are adequately inflated. The interstitial markings remain coarse. There is patchy density at the left lung base which is less conspicuous today. There is better visualization of the left hemidiaphragm. The heart is top-normal in size. The pulmonary vascularity is less engorged and more distinct. The endotracheal tube tip lies approximately 4.8 cm above the carina. Advancement by 2 cm is recommended. The esophagogastric tube tip projects below the inferior margin of the image. The right internal jugular venous catheter tip projects at the junction of the right and left brachiocephalic veins with the tip pointing cephalad. IMPRESSION: 1. Mild improvement in pulmonary interstitial edema though some edema remains. Decreased pulmonary vascular congestion with decreased left basilar atelectasis. No large pleural effusion. 2. Advancement of the nasogastric tube by 2 cm is recommended. There has been interval partial withdrawal of the left internal jugular catheter with its tip pointing cephalad and lying at the junction of the right and left brachiocephalic veins. Electronically Signed  By: David  SwazilandJordan M.D.   On: 01/27/2016 07:26    Labs: BMET  Recent Labs Lab 01/24/16 1545 01/25/16 0435 01/25/16 1600 01/26/16 0535 01/26/16 1600 01/27/16 0448 01/28/16 0402  NA 138 139 138 140 140 140 142  K 4.2 4.5 4.2 3.8 3.9 4.3 4.4  CL 105 106 105 105 107 107 109  CO2 27 29 29 28 26 27 24   GLUCOSE 194* 176* 179*  147* 189* 150* 157*  BUN 39* 39* 42* 43* 48* 67* 112*  CREATININE 1.10* 0.94 1.05* 1.08* 1.28* 2.22* 3.63*  CALCIUM 8.7* 9.1 9.3 9.6 9.8 10.1 10.0  PHOS 2.7 3.2 3.5 3.3 4.0 6.2* 8.6*   CBC  Recent Labs Lab 01/23/16 0415 01/24/16 0339 01/24/16 0922 01/25/16 0435 01/26/16 0535 01/27/16 0447  WBC 14.0* 10.0  --  12.9* 16.0* 14.8*  NEUTROABS 11.4* 8.2*  --   --   --   --   HGB 8.0* 8.0*  --  8.6* 9.1* 7.9*  HCT 23.4* 23.5*  --  25.9* 27.6* 25.0*  MCV 100.9* 100.4*  --  104.4* 106.2* 107.8*  PLT 53* 36* 38* 54* 59* 52*    Medications:    . chlorhexidine  15 mL Mouth Rinse BID  . famotidine  20 mg Oral Daily  . feeding supplement (NEPRO CARB STEADY)  1,000 mL Per Tube Q24H  . feeding supplement (PRO-STAT SUGAR FREE 64)  60 mL Per Tube BID  . folic acid  1 mg Intravenous Daily  . insulin aspart  0-9 Units Subcutaneous Q4H  . lactulose  20 g Per Tube BID  . mouth rinse  15 mL Mouth Rinse QID  . multivitamin  15 mL Oral Daily  . rifaximin  550 mg Oral BID  . sodium chloride flush  10-40 mL Intracatheter Q12H  . thiamine injection  100 mg Intravenous Daily   Zetta BillsJay Daniyah Fohl, MD 01/28/2016, 8:14 AM

## 2016-01-28 NOTE — Progress Notes (Signed)
Patient ID: [facility], female   DOB: March 28, 1966, [name][age] y.o.   MRN: [id number]   Was tentatively scheduled for tunneled dialysis catheter placement in IR today Still with 3 hrs of dialysis to go at 230 pm  Temp cath in place and functioning well  [facility]

## 2016-01-29 DIAGNOSIS — J9600 Acute respiratory failure, unspecified whether with hypoxia or hypercapnia: Secondary | ICD-10-CM

## 2016-01-29 LAB — RENAL FUNCTION PANEL
ALBUMIN: 2.6 g/dL — AB (ref 3.5–5.0)
ANION GAP: 8 (ref 5–15)
BUN: 65 mg/dL — ABNORMAL HIGH (ref 6–20)
CALCIUM: 9 mg/dL (ref 8.9–10.3)
CO2: 27 mmol/L (ref 22–32)
Chloride: 101 mmol/L (ref 101–111)
Creatinine, Ser: 2.74 mg/dL — ABNORMAL HIGH (ref 0.44–1.00)
GFR, EST AFRICAN AMERICAN: 22 mL/min — AB (ref >60)
GFR, EST NON AFRICAN AMERICAN: 19 mL/min — AB (ref >60)
GLUCOSE: 136 mg/dL — AB (ref 65–99)
PHOSPHORUS: 5.8 mg/dL — AB (ref 2.5–4.6)
POTASSIUM: 3.3 mmol/L — AB (ref 3.5–5.1)
SODIUM: 136 mmol/L (ref 135–145)

## 2016-01-29 LAB — GLUCOSE, CAPILLARY
GLUCOSE-CAPILLARY: 116 mg/dL — AB (ref 65–99)
GLUCOSE-CAPILLARY: 118 mg/dL — AB (ref 65–99)
GLUCOSE-CAPILLARY: 119 mg/dL — AB (ref 65–99)
GLUCOSE-CAPILLARY: 143 mg/dL — AB (ref 65–99)
Glucose-Capillary: 111 mg/dL — ABNORMAL HIGH (ref 65–99)
Glucose-Capillary: 142 mg/dL — ABNORMAL HIGH (ref 65–99)

## 2016-01-29 LAB — MAGNESIUM: MAGNESIUM: 2.3 mg/dL (ref 1.7–2.4)

## 2016-01-29 LAB — HEPATITIS B SURFACE ANTIBODY,QUALITATIVE: HEP B S AB: NONREACTIVE

## 2016-01-29 LAB — HEPATITIS B CORE ANTIBODY, TOTAL: Hep B Core Total Ab: NEGATIVE

## 2016-01-29 LAB — HEPATITIS B SURFACE ANTIGEN: Hepatitis B Surface Ag: NEGATIVE

## 2016-01-29 MED ORDER — HEPARIN SODIUM (PORCINE) 1000 UNIT/ML DIALYSIS
1000.0000 [IU] | INTRAMUSCULAR | Status: DC | PRN
  Filled 2016-01-29: qty 6

## 2016-01-29 MED ORDER — PRISMASOL BGK 4/2.5 32-4-2.5 MEQ/L IV SOLN
INTRAVENOUS | Status: DC
  Filled 2016-01-29 (×5): qty 5000

## 2016-01-29 MED ORDER — PHENYLEPHRINE HCL 10 MG/ML IJ SOLN
0.0000 ug/min | INTRAVENOUS | Status: DC
  Administered 2016-01-29: 20 ug/min via INTRAVENOUS
  Filled 2016-01-29: qty 4

## 2016-01-29 MED ORDER — HEPARIN (PORCINE) 2000 UNITS/L FOR CRRT
INTRAVENOUS_CENTRAL | Status: DC | PRN
  Filled 2016-01-29: qty 1000

## 2016-01-29 MED ORDER — PRISMASOL BGK 4/2.5 32-4-2.5 MEQ/L IV SOLN
INTRAVENOUS | Status: DC
  Filled 2016-01-29 (×13): qty 5000

## 2016-01-29 MED ORDER — PRISMASOL BGK 4/2.5 32-4-2.5 MEQ/L IV SOLN
INTRAVENOUS | Status: DC
  Filled 2016-01-29 (×3): qty 5000

## 2016-01-29 MED ORDER — PHENYLEPHRINE HCL 10 MG/ML IJ SOLN
0.0000 ug/min | INTRAMUSCULAR | Status: DC
  Filled 2016-01-29: qty 4

## 2016-01-29 NOTE — Progress Notes (Signed)
[facility] Progress Note   Assessment/ Plan:   1. AKI in setting of PNA and SIRS as well as hyperbilirubinemia (due to cirrhosis). She was previously on CVVHD from 01/21/16 to 01/26/16 mainly for volume removal and then [facility] for the possibility that she may be able to transition to IHD. Attempted conventional hemodialysis yesterday however, unsuccessful with ultrafiltration because of hypotension. The plan is to restart her back on CRRT and press on with family discussions regarding poor prognosis in the face of her cirrhosis. At some point, she will get a tunneled hemodialysis catheter anticipating chronic dialysis needs. No acute electrolyte abnormalities at this time and will correct her mild hypokalemia with CRRT. 2. VDRF due to aspiration vs. CAP/ARDS- Continue ventilator management per CCM. Continue CRRT for ultrafiltration at this time. 3. 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 4. Thrombocytopenia- presumably related to her cirrhosis/chronic alcoholism. No bleeding diathesis noted. Continue heparin free dialysis 5. SIRS- off pressors with intermittently depressed blood pressures-  Subjective:   Attempted hemodialysis yesterday however unsuccessful for ultrafiltration-stable on low-dose pressors. Palliative care note from Dr. Phillips OdorGolding noted yesterday.    Objective:   BP (!) 91/46   Pulse (!) 102   Temp 98.6 F (37 C) (Oral)   Resp 14   Ht 5\' 6"  (1.676 m)   Wt 64.6 kg (142 lb 6.7 oz)   SpO2 99%   BMI 22.99 kg/m   Intake/Output Summary (Last 24 hours) at 01/29/16 0747 Last data filed at 01/29/16 0700  Gross per 24 hour  Intake             2027 ml  Output              500 ml  Net             1527 ml   Weight change: 0.4 kg (14.1 oz)  Physical Exam: [id number]:[id number]:Intubated, opens eyes to touch. CVS: Pulse regular tachycardia, S1 and S2 normal Resp:  Coarse/mechanical breath sounds bilaterally Abd: Soft, slightly distended, nontender Ext: 1+ Edema noted in dependent areas, trace edema over ankles  Imaging: No results found.  Labs: BMET  Recent Labs Lab 01/25/16 0435 01/25/16 1600 01/26/16 0535 01/26/16 1600 01/27/16 0448 01/28/16 0402 01/29/16 0500  NA 139 138 140 140 140 142 136  K 4.5 4.2 3.8 3.9 4.3 4.4 3.3*  CL 106 105 105 107 107 109 101  CO2 29 29 28 26 27 24 27   GLUCOSE 176* 179* 147* 189* 150* 157* 136*  BUN 39* 42* 43* 48* 67* 112* 65*  CREATININE 0.94 1.05* 1.08* 1.28* 2.22* 3.63* 2.74*  CALCIUM 9.1 9.3 9.6 9.8 10.1 10.0 9.0  PHOS 3.2 3.5 3.3 4.0 6.2* 8.6* 5.8*   CBC  Recent Labs Lab 01/23/16 0415 01/24/16 0339  01/25/16 0435 01/26/16 0535 01/27/16 0447 01/28/16 1113  WBC 14.0* 10.0  --  12.9* 16.0* 14.8* 16.6*  NEUTROABS 11.4* 8.2*  --   --   --   --   --   HGB 8.0* 8.0*  --  8.6* 9.1* 7.9* 8.0*  HCT 23.4* 23.5*  --  25.9* 27.6* 25.0* 24.5*  MCV 100.9* 100.4*  --  104.4* 106.2* 107.8* 107.0*  PLT 53* 36*  < > 54* 59* 52* 57*  < > = values in this interval not displayed.  Medications:    . chlorhexidine  15 mL Mouth Rinse BID  .  diazepam  5 mg Intravenous BID  . famotidine  20 mg Oral Daily  . feeding supplement (NEPRO CARB STEADY)  1,000 mL Per Tube Q24H  . feeding supplement (PRO-STAT SUGAR FREE 64)  60 mL Per Tube BID  . folic acid  1 mg Intravenous Daily  . insulin aspart  0-9 Units Subcutaneous Q4H  . lactulose  20 g Per Tube BID  . mouth rinse  15 mL Mouth Rinse QID  . multivitamin  15 mL Oral Daily  . rifaximin  550 mg Oral BID  . sodium chloride flush  10-40 mL Intracatheter Q12H  . thiamine injection  100 mg Intravenous Daily   Zetta Bills, MD 01/29/2016, 7:47 AM

## 2016-01-29 NOTE — Progress Notes (Addendum)
PULMONARY / CRITICAL CARE MEDICINE   Name: [facility] MRN: [id number] DOB: 1966/04/14    ADMISSION DATE:  02-13-2016 CONSULTATION DATE:  01/19/16  REFERRING MD:  Cena Benton - TR[facility]  CHIEF COMPLAINT:  AMS, increased WOB  BRIEF:  50 y/o female with EtOH cirrhosis admitted on 9/2 with CAP, aspirated on 9/6 requiring intubation.  Subsequently, developed ARDS, multi-organ failure and AKI on CRRT.  Pt transferred to [facility] on 9/13 for intermittent HD  SUBJECTIVE:    bp low overnight , Neo started Palliative following , plans for meeting at 1600 today with husband, mother/son  VITAL SIGNS: BP (!) 98/41   Pulse 98   Temp 99.6 F (37.6 C) (Oral)   Resp 14   Ht 5\' 6"  (1.676 m)   Wt 64.6 kg (142 lb 6.7 oz)   SpO2 100%   BMI 22.99 kg/m   HEMODYNAMICS: CVP:  [24 mmHg-34 mmHg] 24 mmHg  VENTILATOR SETTINGS: Vent Mode: PRVC FiO2 (%):  [30 %] 30 % Set Rate:  [15 bmp] 15 bmp Vt Set:  [400 mL] 400 mL PEEP:  [5 cmH20] 5 cmH20 Plateau Pressure:  [11 cmH20-14 cmH20] 14 cmH20  INTAKE / OUTPUT: I/O last 3 completed shifts: In: 2750.1 [I.V.:1250.1; NG/GT:1500] Out: 1260 [Urine:10; Stool:1250]   PHYSICAL EXAMINATION: General: chronically ill appearing on vent, jaundiced HENT: Scleral icterus, ETT in place Neuro: altered, open eyes to voice. (-) lateralizing signs CV: RRR, No MRG PULM: good ae. Crackles bibasilar. GI: distended abdomen but soft, +BS Derm: Trace edema  LABS:  BMET  Recent Labs Lab 01/27/16 0448 01/28/16 0402 01/29/16 0500  NA 140 142 136  K 4.3 4.4 3.3*  CL 107 109 101  CO2 27 24 27   BUN 67* 112* 65*  CREATININE 2.22* 3.63* 2.74*  GLUCOSE 150* 157* 136*    Electrolytes  Recent Labs Lab 01/27/16 0447 01/27/16 0448 01/28/16 0402 01/29/16 0500  CALCIUM  --  10.1 10.0 9.0  MG 2.8*  --  3.1* 2.3  PHOS  --  6.2* 8.6* 5.8*    CBC  Recent Labs Lab 01/26/16 0535 01/27/16 0447 01/28/16 1113  WBC 16.0* 14.8* 16.6*  HGB 9.1* 7.9* 8.0*  HCT 27.6* 25.0*  24.5*  PLT 59* 52* 57*    Coag's  Recent Labs Lab 01/24/16 0922 01/25/16 0435 01/26/16 0535 01/28/16 1113  APTT 62* >200* 39*  --   INR 2.25  --   --  1.99    Sepsis Markers  Recent Labs Lab 01/25/16 0435 01/26/16 0535 01/27/16 0447  PROCALCITON 0.65 0.70 1.52    ABG  Recent Labs Lab 01/23/16 0440 01/23/16 1525 01/27/16 0422  PHART 7.382 7.352 7.480*  PCO2ART 44.0 48.7* 40.3  PO2ART 80.9* 90.4 71.0*    Liver Enzymes  Recent Labs Lab 01/26/16 0927  01/27/16 0448 01/28/16 0402 01/29/16 0500  AST 69*  --   --   --   --   ALT 48  --   --   --   --   ALKPHOS 198*  --   --   --   --   BILITOT 10.9*  --   --   --   --   ALBUMIN 3.1*  < > 2.4* 2.3* 2.6*  < > = values in this interval not displayed.  Cardiac Enzymes No results for input(s): TROPONINI, PROBNP in the last 168 hours.  Glucose  Recent Labs Lab 01/28/16 1148 01/28/16 1537 01/28/16 2002 01/28/16 2329 01/29/16 0346 01/29/16 0814  GLUCAP 167*  105* 171* 127* 143* 118*    Imaging 9/10 CXR images reviewed: bilateral interstitial infiltrates again noted with air space disease, improving    STUDIES:  CXR 9/5 >> multifocal PNA ECHO 9/7 >>EF 65-70% no wall motion abnormalities. PAS estimated at 32 mmHg peak.   CULTURES: Blood 9/2 >> neg Urine 9/2 >> neg Sputum 9/6 >> normal flora Blood 9/6 >> neg Blood 9/13 > (-) C diff 9/13 > (-)  ANTIBIOTICS: Cefepime 9/2 >> 9/7 Flagyl 9/5 >> 9/7 Meropenem 9/7 >> 9/13 Vanc 9/7 >> 9/9  SIGNIFICANT EVENTS: 9/02  Admit 9/06  PCCM called, pt intubated due to increased WOB 9/07  worsening hypoxia/ARDS, started on NMB- also progressive shock/MODS 9/08  HD cath placed. Start CRRT, off vasopressors temporarily 9/11  Off vasopressors, volume status improved, filter clotted  9/12  More alert, failed SBT.  9/13 transferred to [facility] for intermittent HD 9/15- did not tolerate HD  LINES/TUBES: ETT 9/6 > L IJ CVL 9/7 >  R groin HD cath 9/9 >  R Rad  Aline >>  9/12  DISCUSSION: 50 y.o. female with ETOH cirrhosis, admitted 9/2 with HCAP. Had deterioration 9/5 after aspiration event and required intubation early AM hours 9/6 due to increased WOB and hypoxia. Has ARDS, multi-organ failure, started CVVHD 9/8. Overall condition improving 9/10. Transferred to [facility] for intermittent HD on 9/13.   ASSESSMENT / PLAN:  PULMONARY A: ARDS - oxygenation improving dramatically with volume removal Aspiration PNA - improved Acute pulm edema from cirrhosis, renal failure > improving P:   PRVC 8 cc/kg  Wean PEEP / FiO2 for sats >92% VAP prevention measures Daily WUA / SBT  >> not ready today. Has volumes issues.   CARDIOVASCULAR A:  SVT 01/20/2016 AM - likely levophed related Septic shock - 01/20/2016. Resolved.  Hx HLD Pulm edema Hypotension 9/15 , Neo started  P:  Monitor hemodynamics and tele. CVP was 24 this am.  Wean pressors for MAP goal >65  Hold preadmission furosemide, spironolactone  NEUROLOGIC A:   Acute encephalopathy: multifactorial in setting of hepatic encephalopathy and respiratory failure Hx depression, ETOH use (reportedly now abstinent since 06/2015). Alcohol level was elevated in 10/2015.  P:   RASS goal 0 Daily WUA  Minimize sedation as able. Fentanyl drip, versed prn.  Continue folic acid, thiamine Hold preadmission cyclobenzaprine, sertraline, hydroxyzine PAD protocol with PRN versed at reduced dose / fentanyl only  GASTROINTESTINAL A:   Hepatic encephalopathy - rising ammonia 9/12 Hx ETOH cirrhosis, fatty liver, ascites Protein calorie malnutrition GI prophylaxis Nutrition P:   cont lactulose BID. Keep 3-5 BM/day Cont  rifaximin for hepatic encephalopathy Continue tube feedings Pepcid for stress ulcer prophylaxis Ideally needs liver transplant but pt is not abstinent  of ETOH. Last ETOH in June 2017 was elevated.   RENAL A:   AKI - started CVVHD 9/8 and dc'd on 9/13. Urine Na was 33 >> less likely HRS.   Hypophosphatemia P:   Renal following , CRRT to restart  Has a R femoral HD cath > looks clean > for tunnelled HD cath.on Mond 9/18  Once with tunnelled cath and ready to use, d/c femoral HD cath.   HEMATOLOGIC A:   Anemia - chronic without bleeding, 1 unit PRBC 9/8 Thrombocytopenia - chronic VTE Prophylaxis P:  Transfuse for Hgb < 7   No evidence of DIC > PT 25, INR 2.25, PTT 62, Fibrinogen 266, D-Dimer 17.45 Cont SCD's Trend CBC   INFECTIOUS A:   Aspiration PNA -  improved 9/12 Septic shock. Resolved.  Elevated WBC count, (-) fever x 24 hrs.  P:   Will observe off abx. She got 2 weeks of broad spectrum abx, finished 9/13.  Plan to remove femoral HD cath ASAP once with tunnelled cath  ENDOCRINE A:   Hyperglycemia  P:   SSI  Family updated:  Husband Raiford Noble(Rick) updated at bedside am 9/15. I told him re: POOR prognosis over all. Plan for palliative Care consult as Raiford NobleRick is having a hard time making decision. Recommend DNR.      Spoke with husband this am at bedside, 9/16. Palliative meeting 9/16 at 1600 with family   Interdisciplinary Family Meeting v Palliative Care Meeting:  Due by: 9/18.  Critical care time  Tammy Parrett NP-C  [facility]  [phone]   -01/29/2016, 11:29 AM   STAFF NOTE: I, Rory Percyaniel Ronniesha Seibold, MD FACP have personally reviewed patient's available data, including medical history, events of note, physical examination and test results as part of my evaluation. I have discussed with resident/NP and other care providers such as pharmacist, RN and RRT. In addition, I personally evaluated patient and elicited key findings of: eyes open, not following commands, ascites, jaundiced, not following commands, abg reviewed 9/14 likely can reduce rate, if we offer comfort care then the rate wil need to be reduced to 12 for comfort meds, neo at 20 can increase for now if needed, but would max at 200, k supp, not tolerating HD and has underlying  cirhosis, prognosis is poor for fxnal recovery, consider comfort care would be reasonable , continued support would be futile The patient is critically ill with multiple organ systems failure and requires high complexity decision making for assessment and support, frequent evaluation and titration of therapies, application of advanced monitoring technologies and extensive interpretation of multiple databases.   Critical Care Time devoted to patient care services described in this note is30 Minutes. This time reflects time of care of this signee: Rory Percyaniel Hadassa Cermak, MD FACP. This critical care time does not reflect procedure time, or teaching time or supervisory time of PA/NP/Med student/Med Resident etc but could involve care discussion time. Rest per NP/medical resident whose note is outlined above and that I agree with   Mcarthur Rossettianiel J. Tyson AliasFeinstein, MD, FACP Pgr: [phone] [facility] 01/29/2016 1:01 PM

## 2016-01-29 NOTE — Progress Notes (Signed)
Daily Progress Note   Patient Name: Kristina Morris       Date: 01/29/2016 DOB: August 30, 1965  Age: 50 y.o. MRN#: [id number] Attending Physician: Marshell Garfinkel, MD Primary Care Physician: Annye Asa, MD Admit Date: 01/28/2016  Reason for Consultation/Follow-up: Establishing goals of care, Non pain symptom management, Pain control, Psychosocial/spiritual support, Terminal Care and Withdrawal of life-sustaining treatment  Subjective: Family struggling with EOL and how sick pt is. She is legally married but she and her husband have not lived together for awhile. Her husband however has been main point of contact in the hospital. He is at the bedside this am. He met with her brother, mother and 64 yo son last night to discuss her condition and where to go from here. Given that the pt and he do not live together he is requesting family consensus regarding terminal wean.Per husband, her brother just died about 2 years ago from complications r/t alcoholism  Length of Stay: 14  Current Medications: Scheduled Meds:  . chlorhexidine  15 mL Mouth Rinse BID  . diazepam  5 mg Intravenous BID  . famotidine  20 mg Oral Daily  . feeding supplement (NEPRO CARB STEADY)  1,000 mL Per Tube Q24H  . feeding supplement (PRO-STAT SUGAR FREE 64)  60 mL Per Tube BID  . folic acid  1 mg Intravenous Daily  . insulin aspart  0-9 Units Subcutaneous Q4H  . lactulose  20 g Per Tube BID  . mouth rinse  15 mL Mouth Rinse QID  . multivitamin  15 mL Oral Daily  . rifaximin  550 mg Oral BID  . sodium chloride flush  10-40 mL Intracatheter Q12H  . thiamine injection  100 mg Intravenous Daily    Continuous Infusions: . sodium chloride 10 mL/hr at 01/28/16 2105  . fentaNYL infusion INTRAVENOUS 125 mcg/hr (01/29/16 0600)    . phenylephrine (NEO-SYNEPHRINE) Adult infusion    . dialysis replacement fluid (prismasate)    . dialysis replacement fluid (prismasate)    . dialysate (PRISMASATE)      PRN Meds: alteplase, diazepam, fentaNYL, heparin, heparin, levalbuterol, midazolam, [DISCONTINUED] ondansetron **OR** ondansetron (ZOFRAN) IV, sodium chloride flush  Physical Exam  Constitutional: She appears well-developed and well-nourished.  HENT:  Head: Normocephalic and atraumatic.  Cardiovascular: Normal rate and regular rhythm.   hypotensive  Pulmonary/Chest:  intubated  Abdominal: She exhibits distension.  Genitourinary:  Genitourinary Comments: foley  Skin: Skin is warm.  Petechiae   Nursing note and vitals reviewed.           Vital Signs: BP (!) 98/41   Pulse 98   Temp 99.6 F (37.6 C) (Oral)   Resp 14   Ht '5\' 6"'$  (1.676 m)   Wt 64.6 kg (142 lb 6.7 oz)   SpO2 100%   BMI 22.99 kg/m  SpO2: SpO2: 100 % O2 Device: O2 Device: Ventilator O2 Flow Rate: O2 Flow Rate (L/min): 40 L/min  Intake/output summary:  Intake/Output Summary (Last 24 hours) at 01/29/16 1125 Last data filed at 01/29/16 0700  Gross per 24 hour  Intake             1687 ml  Output              500 ml  Net             1187 ml   LBM: Last BM Date: 01/29/16 Baseline Weight: Weight: 59.9 kg (132 lb) Most recent weight: Weight: 64.6 kg (142 lb 6.7 oz)       Palliative Assessment/Data:    Flowsheet Rows   Flowsheet Row Most Recent Value  Intake Tab  Referral Department  Critical care  Unit at Time of Referral  ICU  Palliative Care Primary Diagnosis  Other (Comment) [cirrhosis]  Date Notified  01/28/16  Palliative Care Type  New Palliative care  Reason for referral  Clarify Goals of Care  Date of Admission  02/08/2016  # of days IP prior to Palliative referral  13  Clinical Assessment  Psychosocial & Spiritual Assessment  Palliative Care Outcomes      Patient Active Problem List   Diagnosis Date Noted  . Hepatic  encephalopathy ([place])   . AKI (acute kidney injury) ([place])   . Encounter for intubation   . HCAP (healthcare-associated pneumonia)   . Hypoxia   . Acute respiratory failure with hypoxemia ([place])   . Aspiration pneumonia of both lungs due to regurgitated food ([place])   . Hyponatremia 01/16/2016  . Sepsis ([place]) 02/04/2016  . Alcoholic hepatitis with ascites 11/03/2015  . Jaundice 11/03/2015  . Hypomagnesemia 11/02/2015  . Anemia of chronic disease 10/29/2015  . Elevated INR 10/29/2015  . Ascites 10/29/2015  . Thrombocytopenia ([place]) 10/29/2015  . Cirrhosis ([place]) 10/27/2015  . Fall on same level as cause of accidental injury 02/09/2015  . Rib pain on left side 02/09/2015  . Posttraumatic hematoma of right breast 02/09/2015  . Spinal stenosis in cervical region 12/24/2014  . Cervicogenic headache 12/24/2014  . Hyperreflexia 12/04/2014  . New onset seizure ([place]) 12/02/2014  . Hypokalemia 12/02/2014  . Elevated BP 10/20/2013  . EtOH dependence ([place]) 10/20/2013  . Depression with anxiety 10/20/2013  . Fatty liver 10/20/2013  . Other and unspecified hyperlipidemia 10/20/2013    Palliative Care Assessment & Plan   Patient Profile: 50 y/o female with EtOH cirrhosis admitted on 9/2 with CAP, aspirated on 9/6 requiring intubation.  Subsequently, developed ARDS, multi-organ failure and AKI on CRRT.  Pt transferred to [facility] on 9/13 for intermittent HD   Assessment: Met with multiple family members later in the day including patient's mother, brother, sister-in-law, husband, ex-husband, in-laws, and, and patient's 45 year old daughter. Family needed much medical education as well as clinical update as well as emotional support. At family's request I did prepare 79 year old daughter to see her mother in ICU and she  was able to do this and cope very well. All family members in agreement to proceed with extubation on 01/30/2016 at 79 AM. Patient has a 58 year old son who also was coming in to see his  mother tonight as well as another nephew for extubation  Recommendations/Plan:  We'll proceed with one way extubation on 01/30/2016 at 10 AM. We utilize Dilaudid or morphine for dyspnea as well as Versed for any associated anxiety  Goals of Care and Additional Recommendations:  Limitations on Scope of Treatment: Continue supportive therapy until one way extubation on 01/30/2016. Reviewed in depth futility of treatment modalities such as hemodialysis, trach, PEG  Code Status:    Code Status Orders        Start     Ordered   01/16/16 0125  Full code  Continuous     01/16/16 0125    Code Status History    Date Active Date Inactive Code Status Order ID Comments User Context   10/27/2015  7:31 PM 10/31/2015  4:57 PM Full Code [id number]  Gennaro Africa, MD ED       Prognosis:   Hours - Days after extubation  Discharge Planning:  Anticipated Hospital Death after extubation however if patient stabilizes would meet criteria for inpatient hospice care. We will present this on 01/30/2016 is potential option  Care plan was discussed with Dr. Titus Mould  Thank you for allowing the Palliative Medicine Team to assist in the care of this patient.   Time In: 1630 Time Out: 1835 Total Time 125 Prolonged Time Billed  yes       Greater than 50%  of this time was spent counseling and coordinating care related to the above assessment and plan.  Dory Horn, NP  Please contact Palliative Medicine Team phone at [phone] for questions and concerns.

## 2016-01-30 ENCOUNTER — Inpatient Hospital Stay (HOSPITAL_COMMUNITY): Payer: 59

## 2016-01-30 DIAGNOSIS — Z515 Encounter for palliative care: Secondary | ICD-10-CM

## 2016-01-30 DIAGNOSIS — N189 Chronic kidney disease, unspecified: Secondary | ICD-10-CM

## 2016-01-30 DIAGNOSIS — J80 Acute respiratory distress syndrome: Secondary | ICD-10-CM

## 2016-01-30 DIAGNOSIS — N184 Chronic kidney disease, stage 4 (severe): Secondary | ICD-10-CM

## 2016-01-30 LAB — CBC
HEMATOCRIT: 23.4 % — AB (ref 36.0–46.0)
HEMOGLOBIN: 7.6 g/dL — AB (ref 12.0–15.0)
MCH: 34.7 pg — AB (ref 26.0–34.0)
MCHC: 32.5 g/dL (ref 30.0–36.0)
MCV: 106.8 fL — ABNORMAL HIGH (ref 78.0–100.0)
Platelets: 63 10*3/uL — ABNORMAL LOW (ref 150–400)
RBC: 2.19 MIL/uL — ABNORMAL LOW (ref 3.87–5.11)
RDW: 24.2 % — AB (ref 11.5–15.5)
WBC: 15.1 10*3/uL — ABNORMAL HIGH (ref 4.0–10.5)

## 2016-01-30 LAB — MAGNESIUM: Magnesium: 2.9 mg/dL — ABNORMAL HIGH (ref 1.7–2.4)

## 2016-01-30 LAB — GLUCOSE, CAPILLARY
GLUCOSE-CAPILLARY: 126 mg/dL — AB (ref 65–99)
Glucose-Capillary: 124 mg/dL — ABNORMAL HIGH (ref 65–99)

## 2016-01-30 LAB — BASIC METABOLIC PANEL
Anion gap: 14 (ref 5–15)
BUN: 100 mg/dL — AB (ref 6–20)
CHLORIDE: 100 mmol/L — AB (ref 101–111)
CO2: 22 mmol/L (ref 22–32)
Calcium: 9.1 mg/dL (ref 8.9–10.3)
Creatinine, Ser: 4.06 mg/dL — ABNORMAL HIGH (ref 0.44–1.00)
GFR calc Af Amer: 14 mL/min — ABNORMAL LOW (ref >60)
GFR calc non Af Amer: 12 mL/min — ABNORMAL LOW (ref >60)
Glucose, Bld: 130 mg/dL — ABNORMAL HIGH (ref 65–99)
Potassium: 3.9 mmol/L (ref 3.5–5.1)
Sodium: 136 mmol/L (ref 135–145)

## 2016-01-30 LAB — ALBUMIN: ALBUMIN: 2.6 g/dL — AB (ref 3.5–5.0)

## 2016-01-30 LAB — PHOSPHORUS: Phosphorus: 8.6 mg/dL — ABNORMAL HIGH (ref 2.5–4.6)

## 2016-01-30 MED ORDER — MORPHINE SULFATE 25 MG/ML IV SOLN
10.0000 mg/h | INTRAVENOUS | Status: DC
  Administered 2016-01-30: 10 mg/h via INTRAVENOUS
  Administered 2016-01-31 (×2): 15 mg/h via INTRAVENOUS
  Administered 2016-02-01 – 2016-02-02 (×2): 16 mg/h via INTRAVENOUS
  Filled 2016-01-30 (×5): qty 10

## 2016-01-30 MED ORDER — MORPHINE BOLUS VIA INFUSION
5.0000 mg | INTRAVENOUS | Status: DC | PRN
  Filled 2016-01-30: qty 20

## 2016-01-30 MED ORDER — LIDOCAINE HCL (PF) 1 % IJ SOLN
5.0000 mL | INTRAMUSCULAR | Status: DC | PRN
  Filled 2016-01-30: qty 5

## 2016-01-30 MED ORDER — PENTAFLUOROPROP-TETRAFLUOROETH EX AERO
1.0000 | INHALATION_SPRAY | CUTANEOUS | Status: DC | PRN
Start: 2016-01-30 — End: 2016-01-31

## 2016-01-30 MED ORDER — SODIUM CHLORIDE 0.9 % IV SOLN
100.0000 mL | INTRAVENOUS | Status: DC | PRN
Start: 2016-01-30 — End: 2016-01-31

## 2016-01-30 MED ORDER — SODIUM CHLORIDE 0.9 % IV SOLN: 100.0000 mL | INTRAVENOUS | Status: DC | PRN

## 2016-01-30 MED ORDER — SODIUM CHLORIDE 0.9 % IV SOLN
1.0000 mg/h | INTRAVENOUS | Status: DC
  Administered 2016-01-30: 4 mg/h via INTRAVENOUS
  Administered 2016-01-30: 2 mg/h via INTRAVENOUS
  Administered 2016-01-31 (×3): 4 mg/h via INTRAVENOUS
  Administered 2016-02-01 – 2016-02-02 (×3): 5 mg/h via INTRAVENOUS
  Filled 2016-01-30 (×8): qty 10

## 2016-01-30 MED ORDER — MIDAZOLAM BOLUS VIA INFUSION (WITHDRAWAL LIFE SUSTAINING TX): 5.0000 mg | INTRAVENOUS | Status: DC | PRN

## 2016-01-30 MED ORDER — MIDAZOLAM BOLUS VIA INFUSION (WITHDRAWAL LIFE SUSTAINING TX)
5.0000 mg | INTRAVENOUS | Status: DC | PRN
  Filled 2016-01-30: qty 5

## 2016-01-30 MED ORDER — MIDAZOLAM BOLUS VIA INFUSION (WITHDRAWAL LIFE SUSTAINING TX)
1.0000 mg | INTRAVENOUS | Status: DC | PRN
  Filled 2016-01-30: qty 5

## 2016-01-30 MED ORDER — LIDOCAINE-PRILOCAINE 2.5-2.5 % EX CREA
1.0000 | TOPICAL_CREAM | CUTANEOUS | Status: DC | PRN
Start: 2016-01-30 — End: 2016-01-31

## 2016-01-30 MED ORDER — GLYCOPYRROLATE 0.2 MG/ML IJ SOLN: 0.4000 mg | INTRAMUSCULAR | Status: DC | PRN

## 2016-01-30 MED ORDER — HEPARIN SODIUM (PORCINE) 1000 UNIT/ML DIALYSIS: 1000.0000 [IU] | INTRAMUSCULAR | Status: DC | PRN

## 2016-01-30 MED ORDER — VANCOMYCIN HCL IN DEXTROSE 1-5 GM/200ML-% IV SOLN
1000.0000 mg | INTRAVENOUS | Status: DC
  Filled 2016-01-30: qty 200

## 2016-01-30 NOTE — Progress Notes (Signed)
RT terminally extubated patient to room air per MD order. Patient tolerated well. RN and family at bedside. RT will continue to monitor.

## 2016-01-30 NOTE — Progress Notes (Signed)
[facility] Progress Note   Assessment/ Plan:   1. AKI in setting of PNA and SIRS as well as hyperbilirubinemia (due to cirrhosis). She was previously on CVVHD from 01/21/16 to 01/26/16 mainly for volume removal and then [facility] for the possibility that she may be able to transition to IHD. Attempted conventional hemodialysis on Friday however, unsuccessful UF because of hypotension. I have placed orders for CRRT that will be started if the family still wants us to continue current level of care otherwise, can be discontinued if the plan is for terminal extubation. She is on the schedule for tunneled hemodialysis catheter placement tomorrow with IR if the plan is still to maintain the current level of care. 2. VDRF due to aspiration vs. CAP/ARDS- Continue ventilator management per CCM. Await decision from discussions with family on restarting CRRT versus terminal extubation. 3. 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 4. Thrombocytopenia- presumably related to her cirrhosis/chronic alcoholism. No bleeding diathesis noted. Not on heparin at this time. 5. SIRS- off pressors with intermittently depressed blood pressures-overall, poor prognosis.  Subjective:   Family discussions initiated yesterday-patient currently DO NOT RESUSCITATE. Awaiting additional input from the family today with possible terminal extubation.    Objective:   BP (!) 102/44   Pulse (!) 102   Temp 99.7 F (37.6 C) (Oral)   Resp 13   Ht 5\' 6"  (1.676 m)   Wt 64 kg (141 lb 1.5 oz)   SpO2 100%   BMI 22.77 kg/m   Intake/Output Summary (Last 24 hours) at 01/30/16 0804 Last data filed at 01/30/16 0600  Gross per 24 hour  Intake          1270.55 ml  Output              500 ml  Net           770.55 ml   Weight change: -0.6 kg (-1 lb 5.2 oz)  Physical Exam: ZOX:[id number]:Intubated, opens eyes to  touch. CVS: Pulse regular tachycardia, S1 and S2 normal Resp: Coarse/mechanical breath sounds bilaterally Abd: Soft, slightly distended, nontender Ext: 1+ Edema noted in dependent areas, trace edema over ankles  Imaging: Dg Chest Port 1 View  Result Date: 01/30/2016 CLINICAL DATA:  Patient with history of pneumonia. EXAM: PORTABLE CHEST 1 VIEW COMPARISON:  Chest radiograph 01/27/2016. FINDINGS: The ET tube terminates in mid trachea. Enteric tube courses inferior to the diaphragm. Left IJ central venous catheter tip appears coiled within the azygos vein. Stable cardiac and mediastinal contours. Unchanged bilateral perihilar interstitial opacities. No large pleural effusion or pneumothorax. IMPRESSION: Left IJ central venous catheter tip appears coiled within the azygos vein, consider repositioning. Persistent perihilar interstitial opacities favored to represent edema. These results will be called to the ordering clinician or representative by the Radiologist Assistant, and communication documented in the PACS or zVision Dashboard. Electronically Signed   By: Annia Beltrew  Davis M.D.   On: 01/30/2016 07:34    Labs: BMET  Recent Labs Lab 01/25/16 1600 01/26/16 0535 01/26/16 1600 01/27/16 0448 01/28/16 0402 01/29/16 0500 01/30/16 0330  NA 138 140 140 140 142 136 136  K 4.2 3.8 3.9 4.3 4.4 3.3* 3.9  CL 105 105 107 107 109 101 100*  CO2 29 28 26 27 24 27 22   GLUCOSE 179* 147* 189* 150* 157* 136* 130*  BUN 42* 43* 48* 67* 112* 65* 100*  CREATININE 1.05*  1.08* 1.28* 2.22* 3.63* 2.74* 4.06*  CALCIUM 9.3 9.6 9.8 10.1 10.0 9.0 9.1  PHOS 3.5 3.3 4.0 6.2* 8.6* 5.8* 8.6*   CBC  Recent Labs Lab 01/24/16 0339  01/26/16 0535 01/27/16 0447 01/28/16 1113 01/30/16 0330  WBC 10.0  < > 16.0* 14.8* 16.6* 15.1*  NEUTROABS 8.2*  --   --   --   --   --   HGB 8.0*  < > 9.1* 7.9* 8.0* 7.6*  HCT 23.5*  < > 27.6* 25.0* 24.5* 23.4*  MCV 100.4*  < > 106.2* 107.8* 107.0* 106.8*  PLT 36*  < > 59* 52* 57* 63*   < > = values in this interval not displayed.  Medications:    . chlorhexidine  15 mL Mouth Rinse BID  . diazepam  5 mg Intravenous BID  . famotidine  20 mg Oral Daily  . feeding supplement (NEPRO CARB STEADY)  1,000 mL Per Tube Q24H  . feeding supplement (PRO-STAT SUGAR FREE 64)  60 mL Per Tube BID  . folic acid  1 mg Intravenous Daily  . insulin aspart  0-9 Units Subcutaneous Q4H  . lactulose  20 g Per Tube BID  . mouth rinse  15 mL Mouth Rinse QID  . multivitamin  15 mL Oral Daily  . rifaximin  550 mg Oral BID  . sodium chloride flush  10-40 mL Intracatheter Q12H  . thiamine injection  100 mg Intravenous Daily   Zetta Bills, MD 01/30/2016, 8:04 AM

## 2016-01-30 NOTE — Progress Notes (Signed)
PULMONARY / CRITICAL CARE MEDICINE   Name: Roque CashLaura J Rovner MRN: [id number] DOB: [birth year]    ADMISSION DATE:  02/11/2016 CONSULTATION DATE:  01/19/16  REFERRING MD:  Cena BentonVega - TRH  CHIEF COMPLAINT:  AMS, increased WOB  BRIEF:  50 y/o female with EtOH cirrhosis admitted on 9/2 with CAP, aspirated on 9/6 requiring intubation.  Subsequently, developed ARDS, multi-organ failure and AKI on CRRT.  [facility] for [facility] HD  SUBJECTIVE:    Palliative in with pt today  Pt with terminal wean and extubation , family at bedisde   VITAL SIGNS: BP (!) 106/45   Pulse (!) 105   Temp 99.7 F (37.6 C) (Oral)   Resp 18   Ht 5\' 6"  (1.676 m)   Wt 64 kg (141 lb 1.5 oz)   SpO2 100%   BMI 22.77 kg/m   HEMODYNAMICS: CVP:  [9 mmHg] 9 mmHg  VENTILATOR SETTINGS: Vent Mode: PRVC FiO2 (%):  [30 %] 30 % Set Rate:  [15 bmp] 15 bmp Vt Set:  [400 mL] 400 mL PEEP:  [5 cmH20] 5 cmH20 Plateau Pressure:  [10 cmH20-17 cmH20] 11 cmH20  INTAKE / OUTPUT: I/O last 3 completed shifts: In: 2523.1 [I.V.:1378.1; NG/GT:1145] Out: 1000 [Stool:1000]   PHYSICAL EXAMINATION: General: chronically ill appearing on vent, jaundiced HENT: Scleral icterus Neuro: altered, open eyes to voice. (-) lateralizing signs CV: RRR, No MRG PULM: good ae. Crackles bibasilar. GI: distended abdomen but soft, +BS Derm: Trace edema  LABS:  BMET  Recent Labs Lab 01/28/16 0402 01/29/16 0500 01/30/16 0330  NA 142 136 136  K 4.4 3.3* 3.9  CL 109 101 100*  CO2 24 27 22   BUN 112* 65* 100*  CREATININE 3.63* 2.74* 4.06*  GLUCOSE 157* 136* 130*    Electrolytes  Recent Labs Lab 01/28/16 0402 01/29/16 0500 01/30/16 0330  CALCIUM 10.0 9.0 9.1  MG 3.1* 2.3 2.9*  PHOS 8.6* 5.8* 8.6*    CBC  Recent Labs Lab 01/27/16 0447 01/28/16 1113 01/30/16 0330  WBC 14.8* 16.6* 15.1*  HGB 7.9* 8.0* 7.6*  HCT 25.0* 24.5* 23.4*  PLT 52* 57* 63*    Coag's  Recent Labs Lab 01/24/16 0922 01/25/16 0435  01/26/16 0535 01/28/16 1113  APTT 62* >200* 39*  --   INR 2.25  --   --  1.99    Sepsis Markers  Recent Labs Lab 01/25/16 0435 01/26/16 0535 01/27/16 0447  PROCALCITON 0.65 0.70 1.52    ABG  Recent Labs Lab 01/23/16 1525 01/27/16 0422  PHART 7.352 7.480*  PCO2ART 48.7* 40.3  PO2ART 90.4 71.0*    Liver Enzymes  Recent Labs Lab 01/26/16 0927  01/28/16 0402 01/29/16 0500 01/30/16 0330  AST 69*  --   --   --   --   ALT 48  --   --   --   --   ALKPHOS 198*  --   --   --   --   BILITOT 10.9*  --   --   --   --   ALBUMIN 3.1*  < > 2.3* 2.6* 2.6*  < > = values in this interval not displayed.  Cardiac Enzymes No results for input(s): TROPONINI, PROBNP in the last 168 hours.  Glucose  Recent Labs Lab 01/29/16 1236 01/29/16 1557 01/29/16 1954 01/29/16 2338 01/30/16 0325 01/30/16 0741  GLUCAP 111* 116* 142* 119* 126* 124*    Imaging 9/10 CXR images reviewed: bilateral interstitial infiltrates again noted with air space  disease, improving    STUDIES:  CXR 9/5 >> multifocal PNA ECHO 9/7 >>EF 65-70% no wall motion abnormalities. PAS estimated at 32 mmHg peak.   CULTURES: Blood 9/2 >> neg Urine 9/2 >> neg Sputum 9/6 >> normal flora Blood 9/6 >> neg Blood 9/13 > (-) C diff 9/13 > (-)  ANTIBIOTICS: Cefepime 9/2 >> 9/7 Flagyl 9/5 >> 9/7 Meropenem 9/7 >> 9/13 Vanc 9/7 >> 9/9  SIGNIFICANT EVENTS: 9/02  Admit 9/06  PCCM called, pt intubated due to increased WOB 9/07  worsening hypoxia/ARDS, started on NMB- also progressive shock/MODS 9/08  HD cath placed. Start CRRT, off vasopressors temporarily 9/11  Off vasopressors, volume status improved, filter clotted  9/12  More alert, failed SBT.  9/13 transferred to [facility] for intermittent HD 9/15- did not tolerate HD  LINES/TUBES: ETT 9/6 > L IJ CVL 9/7 >  R groin HD cath 9/9 >  R Rad Aline >>  9/12  DISCUSSION: 50 y.o. female with ETOH cirrhosis, admitted 9/2 with HCAP. Had deterioration 9/5 after  aspiration event and required intubation early AM hours 9/6 due to increased WOB and hypoxia. Has ARDS, multi-organ failure, started CVVHD 9/8. Overall condition improving 9/10. Transferred to [facility] for intermittent HD on 9/13.   ASSESSMENT / PLAN:  PULMONARY A: ARDS - oxygenation improving dramatically with volume removal Aspiration PNA - improved Acute pulm edema from cirrhosis, renal failure > improving P:   Terminal wean. Comfort care in place    CARDIOVASCULAR A:  SVT 01/20/2016 AM - likely levophed related Septic shock - 01/20/2016. Resolved.  Hx HLD Pulm edema Hypotension 9/15 , Neo started  P:  No further labs.  Comfort care   NEUROLOGIC A:   Acute encephalopathy: multifactorial in setting of hepatic encephalopathy and respiratory failure Hx depression, ETOH use (reportedly now abstinent since 06/2015). Alcohol level was elevated in 10/2015.  P:   Comfort care    GASTROINTESTINAL A:   Hepatic encephalopathy - rising ammonia 9/12 Hx ETOH cirrhosis, fatty liver, ascites Protein calorie malnutrition GI prophylaxis Nutrition P:   D/c labs, terminal wean with comfort care .   RENAL A:   AKI - started CVVHD 9/8 and dc'd on 9/13. Urine Na was 33 >> less likely HRS.  Hypophosphatemia P:   No further labs    HEMATOLOGIC A:   Anemia - chronic without bleeding, 1 unit PRBC 9/8 Thrombocytopenia - chronic VTE Prophylaxis P:   no further labs   INFECTIOUS A:   Aspiration PNA - improved 9/12 Septic shock. Resolved.  Elevated WBC count, (-) fever x 24 hrs.  P:    no further meds  ENDOCRINE A:   Hyperglycemia  P:   Comfort care   Family updated:  Husband Raiford Noble) updated at bedside am 9/15. I told him re: POOR prognosis over all. Plan for palliative Care consult as Raiford Noble is having a hard time making decision. Recommend DNR.      Spoke with husband this am at bedside, 9/16. Palliative meeting 9/16 at 1600 with family , pt with comfort care and terminal wean 9/17 .    Interdisciplinary Family Meeting v Palliative Care Meeting:  Due by: 9/18.    Tammy Parrett NP-C  [facility]  [phone]   -01/30/2016, 12:12 PM   STAFF NOTE: I, Rory Percy, MD FACP have personally reviewed patient's available data, including medical history, events of note, physical examination and test results as part of my evaluation. I have discussed with resident/NP and other  care providers such as pharmacist, RN and RRT. In addition, I personally evaluated patient and elicited key findings of: lethargic, ronchi, abdo distention, comfort care decided, assessed RR , morphine added, versed drip pwer pall care , titrate morphine to pain free and rr 12-18, also d/w renal, can go to floor med floor for comfort , NO ROLE ADD O2   Mcarthur Rossetti. Tyson Alias, MD, FACP Pgr: [phone]  Pulmonary & Critical Care 01/30/2016 2:13 PM

## 2016-01-30 NOTE — Progress Notes (Signed)
Daily Progress Note   Patient Name: Kristina Morris       Date: 01/30/2016 DOB: 25-May-1965  Age: 50 y.o. MRN#: [id number] Attending Physician: Chilton Greathouse, MD Primary Care Physician: Neena Rhymes, MD Admit Date: 01/31/2016  Reason for Consultation/Follow-up: Non pain symptom management, Pain control, Psychosocial/spiritual support, Terminal Care and Withdrawal of life-sustaining treatment  Subjective: Family gathering. Plan is for extubation today. Medications on order. Process explained to family again. Pt is breathing above the vent. Will start MS04 gtt as well as versed gtt  Length of Stay: 15  Current Medications: Scheduled Meds:  . chlorhexidine  15 mL Mouth Rinse BID  . insulin aspart  0-9 Units Subcutaneous Q4H  . mouth rinse  15 mL Mouth Rinse QID    Continuous Infusions: . sodium chloride 10 mL/hr at 01/30/16 0000  . midazolam (VERSED) infusion    . morphine      PRN Meds: alteplase, glycopyrrolate, levalbuterol, midazolam, morphine, [DISCONTINUED] ondansetron **OR** ondansetron (ZOFRAN) IV, sodium chloride flush  Physical Exam  Constitutional: She appears well-developed and well-nourished.  Acutely ill appearing  HENT:  Head: Normocephalic and atraumatic.  Cardiovascular:  tachy  Pulmonary/Chest:  On ventilator Breathing above the vent  Neurological:  Minimally responsive  Skin: Skin is warm and dry.  jaundied  Psychiatric: Her behavior is normal.  Nursing note and vitals reviewed.           Vital Signs: BP (!) 106/45   Pulse (!) 105   Temp 99.7 F (37.6 C) (Oral)   Resp 18   Ht 5\' 6"  (1.676 m)   Wt 64 kg (141 lb 1.5 oz)   SpO2 100%   BMI 22.77 kg/m  SpO2: SpO2: 100 % O2 Device: O2 Device: Ventilator O2 Flow Rate: O2 Flow Rate (L/min): 40  L/min  Intake/output summary:  Intake/Output Summary (Last 24 hours) at 01/30/16 1038 Last data filed at 01/30/16 0600  Gross per 24 hour  Intake          1185.55 ml  Output              500 ml  Net           685.55 ml   LBM: Last BM Date: 01/30/16 Baseline Weight: Weight: 59.9 kg (132 lb) Most recent weight: Weight: 64 kg (141 lb  1.5 oz)       Palliative Assessment/Data:    Flowsheet Rows   Flowsheet Row Most Recent Value  Intake Tab  Referral Department  Critical care  Unit at Time of Referral  ICU  Palliative Care Primary Diagnosis  Other (Comment) [cirrhosis]  Date Notified  01/28/16  Palliative Care Type  New Palliative care  Reason for referral  Clarify Goals of Care  Date of Admission  02/03/2016  # of days IP prior to Palliative referral  13  Clinical Assessment  Psychosocial & Spiritual Assessment  Palliative Care Outcomes      Patient Active Problem List   Diagnosis Date Noted  . ARDS (adult respiratory distress syndrome) (HCC)   . Palliative care encounter   . CKD (chronic kidney disease)   . Hepatic encephalopathy (HCC)   . AKI (acute kidney injury) (HCC)   . Encounter for intubation   . HCAP (healthcare-associated pneumonia)   . Hypoxia   . Acute respiratory failure (HCC)   . Aspiration pneumonia of both lungs due to regurgitated food (HCC)   . Hyponatremia 01/16/2016  . Sepsis (HCC) 01/24/2016  . Alcoholic hepatitis with ascites 11/03/2015  . Jaundice 11/03/2015  . Hypomagnesemia 11/02/2015  . Anemia of chronic disease 10/29/2015  . Elevated INR 10/29/2015  . Ascites 10/29/2015  . Thrombocytopenia (HCC) 10/29/2015  . Cirrhosis (HCC) 10/27/2015  . Fall on same level as cause of accidental injury 02/09/2015  . Rib pain on left side 02/09/2015  . Posttraumatic hematoma of right breast 02/09/2015  . Spinal stenosis in cervical region 12/24/2014  . Cervicogenic headache 12/24/2014  . Hyperreflexia 12/04/2014  . New onset seizure (HCC) 12/02/2014   . Hypokalemia 12/02/2014  . Elevated BP 10/20/2013  . EtOH dependence (HCC) 10/20/2013  . Depression with anxiety 10/20/2013  . Fatty liver 10/20/2013  . Other and unspecified hyperlipidemia 10/20/2013    Palliative Care Assessment & Plan   Patient Profile:  50 y/o female with EtOH cirrhosis admitted on 9/2 with CAP, aspirated on 9/6 requiring intubation.  Subsequently, developed ARDS, multi-organ failure and AKI on CRRT.  Pt transferred to [facility] on 9/13 for intermittent HD. Palliative Care consulted for GOC discussion. Faily mtgs held. Plan is for one-way extubation today   Assessment: Pt breathing above the vent MS04 gtt and versed gtt started. MS04 gtt and versed gtt started. Extubated without difficulty. Respirations are even and unlabored. No agitation. Family has been at the bedside and overall coping well. Did discuss with family where she to stabilize to consider hospice inpatient. Patient's mother and patient's sister-in-law have both had good experiences with hospice inpatient  Recommendations/Plan:  Dyspnea: Cont Ms04 gtt and tirate for comfort  Terminal agitation: DC valium. Start versed gtt. Monitor and tirate for effect  Secretions: Robinul PRN. Monitor for need for scheduled doing  Will DC TF 2/2 concerns for increased secretions and worsening resp status  Goals of Care and Additional Recommendations:  Limitations on Scope of Treatment: Minimize Medications, No Artificial Feeding, No Blood Transfusions, No Chemotherapy, No Diagnostics, No Glucose Monitoring, No Hemodialysis, No IV Antibiotics, No Radiation, No Surgical Procedures and No Tracheostomy  Code Status:    Code Status Orders        Start     Ordered   01/30/16 1013  DNR (Do not attempt resuscitation)  Continuous    Question Answer Comment  In the event of cardiac or respiratory ARREST Do not call a "code blue"   In the event of cardiac  or respiratory ARREST Do not perform Intubation, CPR,  defibrillation or ACLS   In the event of cardiac or respiratory ARREST Use medication by any route, position, wound care, and other measures to relive pain and suffering. May use oxygen, suction and manual treatment of airway obstruction as needed for comfort.      01/30/16 1014    Code Status History    Date Active Date Inactive Code Status Order ID Comments User Context   01/29/2016  7:08 PM 01/30/2016 10:14 AM DNR [id number]  Irean HongSarah Grace Mayzie Caughlin, NP Inpatient   01/16/2016  1:25 AM 01/29/2016  7:08 PM Full Code [id number]  Alberteen Samhristopher P Danford, MD Inpatient   10/27/2015  7:31 PM 10/31/2015  4:57 PM Full Code [id number]  Eston EstersAhmad Hamad, MD ED       Prognosis:   Hours - Days  Discharge Planning:  Anticipated Hospital Death. But if pt is to stablaize she would meet Hospice inpatient criteria  Care plan was discussed with Dr. Tyson AliasFeinstein; bedside RN  Thank you for allowing the Palliative Medicine Team to assist in the care of this patient.   Time In: 1000 Time Out: 1200 Total Time 120 Prolonged Time Billed  yes       Greater than 50%  of this time was spent counseling and coordinating care related to the above assessment and plan.  Irean HongSarah Grace Shuree Brossart, NP  Please contact Palliative Medicine Team phone at [phone] for questions and concerns.

## 2016-01-30 NOTE — Progress Notes (Signed)
wasted 150 mL of fentanyl in the sink. Witnessed by Owens & MinorLauren RN

## 2016-01-31 ENCOUNTER — Encounter: Payer: Self-pay | Admitting: Family Medicine

## 2016-01-31 DIAGNOSIS — Z515 Encounter for palliative care: Secondary | ICD-10-CM

## 2016-01-31 LAB — CULTURE, BLOOD (ROUTINE X 2)
CULTURE: NO GROWTH
CULTURE: NO GROWTH

## 2016-01-31 MED ORDER — ACETAMINOPHEN 650 MG RE SUPP: 650.0000 mg | RECTAL | Status: DC | PRN

## 2016-01-31 NOTE — Progress Notes (Signed)
Palliative Medicine RN Note: Saw pt after Dr Phillips OdorGolding. Anticipate hospital death. Pt having periods of apnea up to 15 seconds while I was in room. Pt relaxed and non responsive. BLE with pitting edema. Rectal tube in place.  Discussed plan for me to see pt in am to ensure comfort and follow decline. Per Dr Lamar BlinksGolding's assessment today, pt is imminently dying and is not stable to transport to a hospice facility. PMT will f/u tomorrow.  Margret ChanceMelanie G. Adonnis Salceda, RN, BSN, [facility] 01/31/2016 1:39 PM Cell [phone] 8:00-4:00 Monday-Friday Office [phone]

## 2016-01-31 NOTE — Progress Notes (Signed)
Daily Progress Note   Patient Name: Kristina Morris       Date: 01/31/2016 DOB: June 13, 1965  Age: 50 y.o. MRN#: [id number] Attending Physician: Chilton GreathousePraveen Mannam, MD Primary Care Physician: Neena RhymesKatherine Tabori, MD Admit Date: 01/21/2016  Reason for Consultation/Follow-up: Psychosocial/spiritual support and Terminal Care  Subjective: Unresponsive. Transitioned to full comfort care 9/17. Actively Dying.  Length of Stay: 16  Current Medications: Scheduled Meds:     Continuous Infusions: . sodium chloride 10 mL/hr at 01/30/16 0000  . midazolam (VERSED) infusion 4 mg/hr (01/31/16 1859)  . morphine 15 mg/hr (01/31/16 1859)    PRN Meds: acetaminophen, glycopyrrolate, midazolam, morphine  Physical Exam          Vital Signs: BP (!) 106/45   Pulse (!) 105   Temp 99.7 F (37.6 C) (Oral)   Resp 18   Ht 5\' 6"  (1.676 m)   Wt 64 kg (141 lb 1.5 oz)   SpO2 100%   BMI 22.77 kg/m  SpO2: SpO2: 100 % O2 Device: O2 Device: Ventilator O2 Flow Rate: O2 Flow Rate (L/min): 40 L/min  Intake/output summary:  Intake/Output Summary (Last 24 hours) at 01/31/16 2328 Last data filed at 01/31/16 1859  Gross per 24 hour  Intake            705.8 ml  Output              800 ml  Net            -94.2 ml   LBM: Last BM Date: 01/31/16 (Flexiseal) Baseline Weight: Weight: 59.9 kg (132 lb) Most recent weight: Weight: 64 kg (141 lb 1.5 oz)       Palliative Assessment/Data:    Flowsheet Rows   Flowsheet Row Most Recent Value  Intake Tab  Referral Department  Critical care  Unit at Time of Referral  ICU  Palliative Care Primary Diagnosis  Other (Comment) [cirrhosis]  Date Notified  01/28/16  Palliative Care Type  New Palliative care  Reason for referral  Clarify Goals of Care  Date of Admission  Dec 09, 2015   # of days IP prior to Palliative referral  13  Clinical Assessment  Psychosocial & Spiritual Assessment  Palliative Care Outcomes      Patient Active Problem List   Diagnosis Date Noted  . ARDS (adult respiratory  distress syndrome) (HCC)   . Palliative care encounter   . CKD (chronic kidney disease)   . Hepatic encephalopathy (HCC)   . AKI (acute kidney injury) (HCC)   . Encounter for intubation   . HCAP (healthcare-associated pneumonia)   . Hypoxia   . Acute respiratory failure (HCC)   . Aspiration pneumonia of both lungs due to regurgitated food (HCC)   . Hyponatremia 01/16/2016  . Sepsis (HCC) 02/08/2016  . Alcoholic hepatitis with ascites 11/03/2015  . Jaundice 11/03/2015  . Hypomagnesemia 11/02/2015  . Anemia of chronic disease 10/29/2015  . Elevated INR 10/29/2015  . Ascites 10/29/2015  . Thrombocytopenia (HCC) 10/29/2015  . Cirrhosis (HCC) 10/27/2015  . Fall on same level as cause of accidental injury 02/09/2015  . Rib pain on left side 02/09/2015  . Posttraumatic hematoma of right breast 02/09/2015  . Spinal stenosis in cervical region 12/24/2014  . Cervicogenic headache 12/24/2014  . Hyperreflexia 12/04/2014  . New onset seizure (HCC) 12/02/2014  . Hypokalemia 12/02/2014  . Elevated BP 10/20/2013  . EtOH dependence (HCC) 10/20/2013  . Depression with anxiety 10/20/2013  . Fatty liver 10/20/2013  . Other and unspecified hyperlipidemia 10/20/2013    Palliative Care Assessment & Plan   Patient Profile: 50 yo ETOH Liver Failure, Renal Failure  Assessment: Actively Dying. Transitioned to full comfort care.  Recommendations/Plan:  She is not stable for transport to a hospice facility, she cannot be GIP because she has private insurance. PMT will provide support to this family.  Maintain versed infusion and morphine infusion  Goals of Care and Additional Recommendations:  Limitations on Scope of Treatment: Full Comfort Care  Code Status:    Code  Status Orders        Start     Ordered   01/30/16 1013  DNR (Do not attempt resuscitation)  Continuous    Question Answer Comment  In the event of cardiac or respiratory ARREST Do not call a "code blue"   In the event of cardiac or respiratory ARREST Do not perform Intubation, CPR, defibrillation or ACLS   In the event of cardiac or respiratory ARREST Use medication by any route, position, wound care, and other measures to relive pain and suffering. May use oxygen, suction and manual treatment of airway obstruction as needed for comfort.      01/30/16 1014    Code Status History    Date Active Date Inactive Code Status Order ID Comments User Context   01/29/2016  7:08 PM 01/30/2016 10:14 AM DNR [id number]  Irean Hong, NP Inpatient   01/16/2016  1:25 AM 01/29/2016  7:08 PM Full Code [id number]  Alberteen Sam, MD Inpatient   10/27/2015  7:31 PM 10/31/2015  4:57 PM Full Code [id number]  Eston Esters, MD ED       Prognosis:   Hours - Days  Discharge Planning:  Anticipated Hospital Death  Care plan was discussed with her husband and brother at bedside.  Thank you for allowing the Palliative Medicine Team to assist in the care of this patient.   Time In: 12PM Time Out: 12:25PM Total Time 25 min Prolonged Time Billed no      Greater than 50%  of this time was spent counseling and coordinating care related to the above assessment and plan.  Audia Amick, DO  Please contact Palliative Medicine Team phone at [phone] for questions and concerns.

## 2016-01-31 NOTE — Progress Notes (Signed)
Nutrition Brief Note  Chart reviewed. Pt now transitioning to comfort care.  No further nutrition interventions warranted at this time.  Please re-consult as needed.   Zanaiya Calabria A. Avonell Lenig, RD, LDN, CDE Pager: [phone] After hours Pager: [phone]  

## 2016-01-31 NOTE — Progress Notes (Signed)
[facility] Brief Note Plans for comfort care noted. Renal will sign off.  Camille Balynthia Tyronza Happe, MD [facility] [phone] Pager 01/31/2016, 9:37 AM

## 2016-01-31 NOTE — Progress Notes (Addendum)
PCCM PROGRESS NOTE  Admission Date: 01/14/2016  CC: alerted mental status  Subjective: Appears comfortable  Vital signs: BP (!) 106/45   Pulse (!) 105   Temp 99.7 F (37.6 C) (Oral)   Resp 18   Ht 5\' 6"  (1.676 m)   Wt 141 lb 1.5 oz (64 kg)   SpO2 100%   BMI 22.77 kg/m   General: somnolent Neuro: unresponsive HEENT: snoring Cardiac: regular Chest: shallow respirations Abd: soft Ext: 1+ edema  Assessment: Alcoholic cirrhosis with ascites Community acquired pneumonia Aspiration pneumonia ARDS Acute kidney injury Acute pulmonary edema Septic shock SVT Hx of HLD Acute metabolic encephalopathy Hepatic encephalopathy Anemia of critical illness and chronic disease Thrombocytopenia 2nd to sepsis and cirrhosis Hyperglycemia  Plan: DNR/DNI Versed, morphine for comfort Prn robinul for respiratory secretions Tylenol PR for fever  Updated pt's husband and brother at bedside  Coralyn HellingVineet Everlene Cunning, MD [facility] 01/31/2016, 1:08 PM Pager:  [phone] After 3pm call: [phone]  Moderate protein calorie malnutrition

## 2016-02-01 NOTE — Progress Notes (Addendum)
Palliative Medicine RN Note: Patient is comfortable with friends and family in room. Spoke with RN to confirm there were no problems MDs were unaware of.   Patient is having short periods of apnea and is unresponsive. Her breathing has changed since this am; while she is breathing easier, she in not breathing as deeply. Plan f/u by PMT in am, if patient survives the night.  Margret ChanceMelanie G. Aryaman Haliburton, RN, BSN, [facility] 02/01/2016 4:11 PM Cell [phone] 8:00-4:00 Monday-Friday Office [phone]

## 2016-02-01 NOTE — Progress Notes (Signed)
Wasted 7.765ml of versed, when hung new bag.  Witness by Darcel BayleyKristen Coble, RN.  Forbes Cellarelcine Juelz Claar, RN

## 2016-02-01 NOTE — Progress Notes (Signed)
Palliative Medicine RN Note: Pt in bed, non responsive, relaxed. She has morphine and Versed gtts running. Family is happy with her comfort level. Left PMT contact information. Plan f/u this afternoon.  Margret ChanceMelanie G. Kiosha Buchan, RN, BSN, [facility] 02/01/2016 10:00 AM Cell [phone] 8:00-4:00 Monday-Friday Office [phone]

## 2016-02-01 NOTE — Progress Notes (Signed)
PCCM PROGRESS NOTE  Admission Date: 01/14/2016  CC: alerted mental status  Subjective: Moaning this AM >> improved after medications adjusted.  Vital signs: BP (!) 106/45   Pulse (!) 105   Temp 99.7 F (37.6 C) (Oral)   Resp 18   Ht 5\' 6"  (1.676 m)   Wt 141 lb 1.5 oz (64 kg)   SpO2 100%   BMI 22.77 kg/m   General: somnolent Neuro: unresponsive HEENT: snoring Cardiac: regular Chest: shallow respirations Abd: soft Ext: 1+ edema  Assessment: Alcoholic cirrhosis with ascites Community acquired pneumonia Aspiration pneumonia ARDS Acute kidney injury Acute pulmonary edema Septic shock SVT Hx of HLD Acute metabolic encephalopathy Hepatic encephalopathy Anemia of critical illness and chronic disease Thrombocytopenia 2nd to sepsis and cirrhosis Hyperglycemia Moderate protein calorie malnutrition  Plan: DNR/DNI Versed, morphine for comfort Prn robinul for respiratory secretions Tylenol PR for fever  Updated pt's husband at bedside.  Coralyn HellingVineet Aron Needles, MD [facility] 02/01/2016, 10:14 AM Pager:  [phone] After 3pm call: [phone]

## 2016-02-03 ENCOUNTER — Telehealth: Payer: Self-pay

## 2016-02-03 NOTE — Telephone Encounter (Signed)
On 02/03/2016 I received a death certificate from [facility] (original). The death certificate is for burial. The patient is a patient of Doctor Sood. [id number] tomorrow for signature.  On 02/04/2016 I received the death certificate back from [facility]. I got the death certificate ready and called the funeral home to let them know the death certificate is ready for pickup.

## 2016-02-13 NOTE — Progress Notes (Signed)
Patient passed away at 0730. No heart or lung sounds heard on auscultation and verified by Darcel BayleyKristen Araeya Lamb, RN and Ezequiel KayserSkylar Burnham, RN. [facility] notified at [phone]. Spoke with Erasmo ScorePatrick Small, representative for [facility]. Referral number is [id number]. Patient [facility] be a possible [facility]. No family at bedside. Patient's spouse, Raiford NobleRick, has been notified. Patient will be transported to the morgue at request of patient's husband.

## 2016-02-13 NOTE — Progress Notes (Signed)
50mL of  Versed wasted and 225mL of Morphine wasted. Witnessed by [employer] CorporationSkylar Burnham,RN.

## 2016-02-13 NOTE — Discharge Summary (Signed)
Kristina Morris was a 50 y.o. female admitted on 01/30/2016 with shortness of breath, chest pain, fever.  She had recent episode of pneumonia.  She was hypoxic in ER.  She aspirated on medications 9/05 and required intubation.  She developed ARDS and septic shock.  She developed progressive renal failure and nephrology consulted.  She was started on CRRT.  She transitioned off pressors, and off CRRT.  She was changed to intermittent HD.  She was still required HD and vent support.  Palliative care consulted.  Family opted for DNR status and one way extubation.  She was extubated 9/17 and transitioned to comfort measures.  She expired on 01/30/2016 at 7:30 AM.   Final Diagnoses: Alcoholic cirrhosis with ascites Community acquired pneumonia Aspiration pneumonia ARDS Acute kidney injury Acute pulmonary edema Septic shock SVT Hx of HLD Acute metabolic encephalopathy Hepatic encephalopathy Anemia of critical illness and chronic disease Thrombocytopenia 2nd to sepsis and cirrhosis Hyperglycemia Moderate protein calorie malnutrition   Coralyn HellingVineet Kristina Herst, MD [facility] 02/12/2016, 8:59 AM

## 2016-02-13 DEATH — deceased

## 2018-01-21 IMAGING — DX DG LUMBAR SPINE COMPLETE 4+V
5 series · 5 of 5 positions shown · non-contrast
Comparison: Abdominal and pelvic CT scan dated October 27, 2015

CLINICAL DATA: Status post fall yesterday with persistent low back
and posterior right rib discomfort.

EXAM:
LUMBAR SPINE - COMPLETE 4+ VIEW

[l-spine ap]
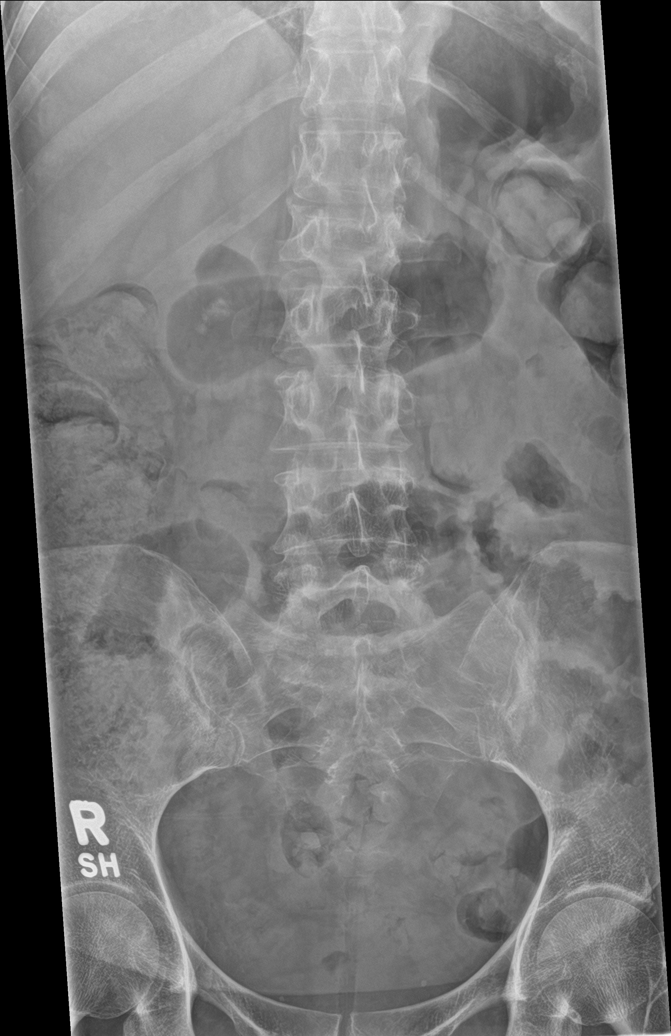

[l-spine obl (1 of 2)]
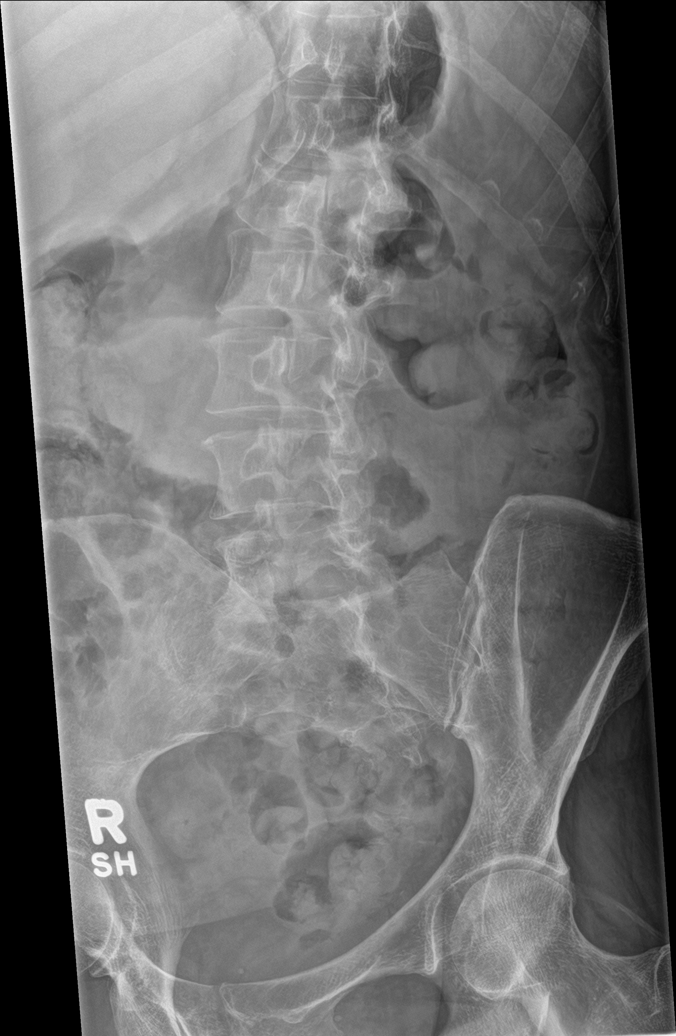

[l-spine obl (2 of 2)]
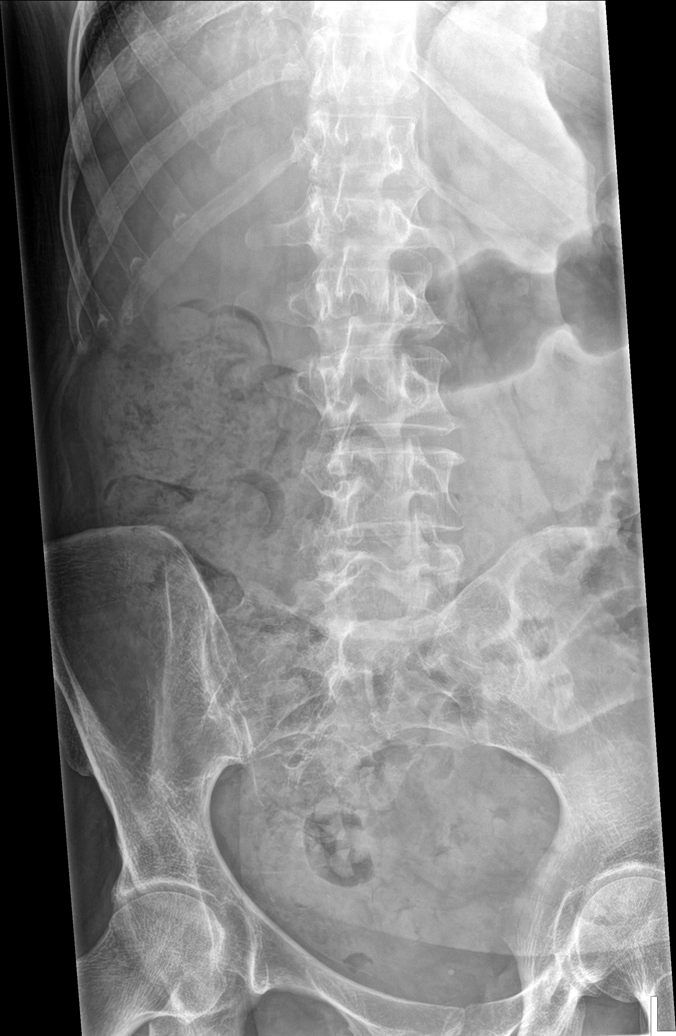

[l-spine lat]
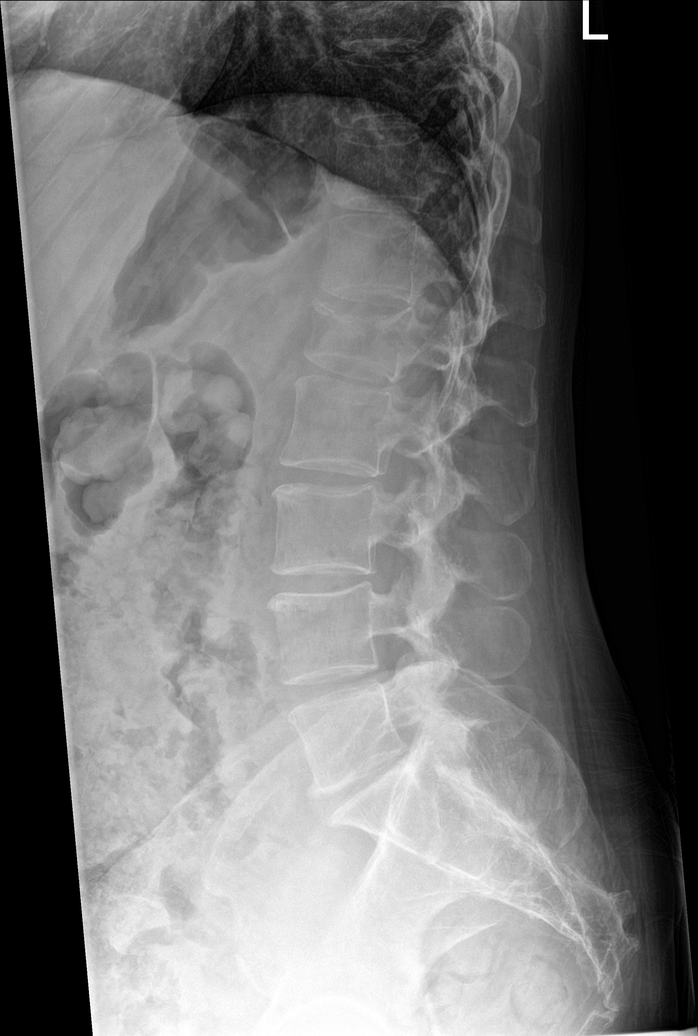

[l-spine spot]
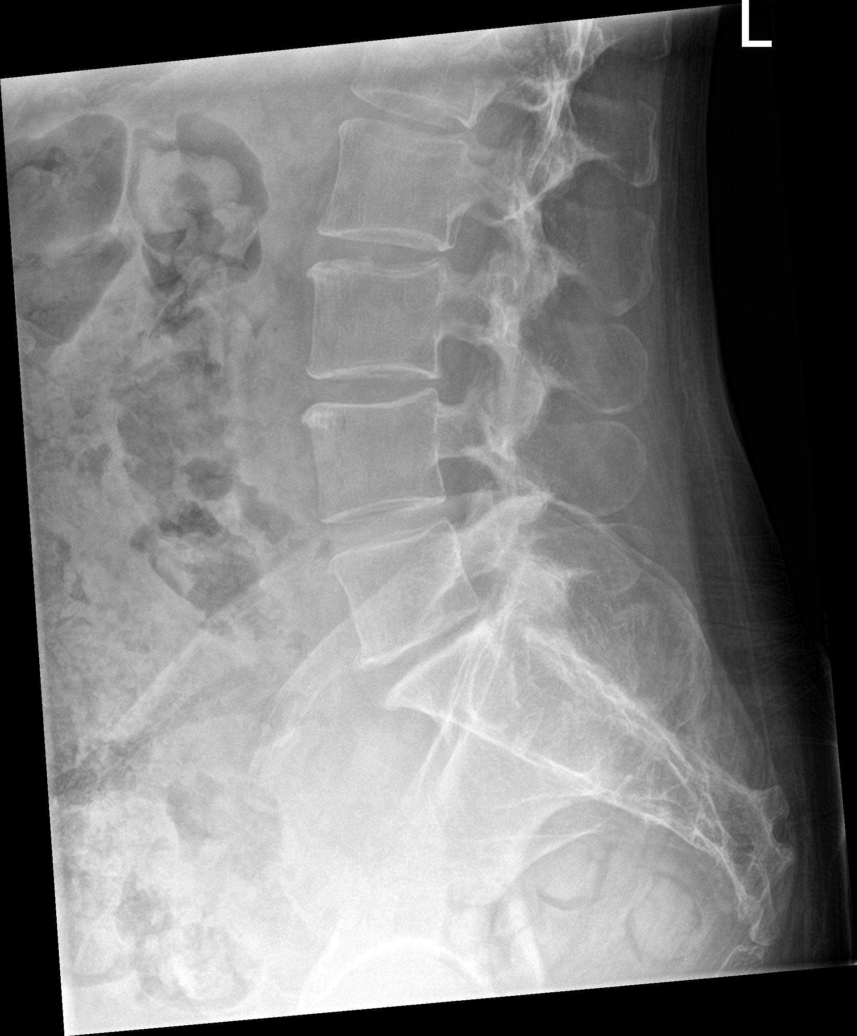

[5 of 5 positions shown; findings below may reference images not displayed]

FINDINGS: The patient has sustained acute partial compression of the body of
L1. The loss of height anteriorly is approximately 20% and
posteriorly approximately 10%. There is no spondylolisthesis. There
is mild disc space narrowing at L4-5 and at L5-S1. There is facet
joint hypertrophy at L5-S1. The pedicles and transverse processes
are intact. The observed portions of the sacrum are normal.
IMPRESSION: Acute approximately 20% anterior and 10% posterior compression of
the body of L1. Mild degenerative disc space narrowing at L4-5 and
L5-S1.
